# Patient Record
Sex: Female | Born: 1965 | Race: Black or African American | Hispanic: No | Marital: Married | Smoking: Never smoker
Health system: Southern US, Community
[De-identification: ages and names within clinical notes are randomized; demographics above are authoritative.]

## PROBLEM LIST (undated history)

## (undated) DIAGNOSIS — C801 Malignant (primary) neoplasm, unspecified: Secondary | ICD-10-CM

## (undated) DIAGNOSIS — I10 Essential (primary) hypertension: Secondary | ICD-10-CM

---

## 1998-05-20 ENCOUNTER — Other Ambulatory Visit: Admission: RE | Admit: 1998-05-20 | Discharge: 1998-05-20 | Payer: Self-pay | Admitting: Obstetrics and Gynecology

## 1998-07-09 ENCOUNTER — Other Ambulatory Visit: Admission: RE | Admit: 1998-07-09 | Discharge: 1998-07-09 | Payer: Self-pay | Admitting: Obstetrics & Gynecology

## 1998-11-27 ENCOUNTER — Other Ambulatory Visit: Admission: RE | Admit: 1998-11-27 | Discharge: 1998-11-27 | Payer: Self-pay | Admitting: *Deleted

## 1999-05-13 ENCOUNTER — Inpatient Hospital Stay (HOSPITAL_COMMUNITY): Admission: AD | Admit: 1999-05-13 | Discharge: 1999-05-15 | Payer: Self-pay | Admitting: Obstetrics and Gynecology

## 2001-02-23 ENCOUNTER — Other Ambulatory Visit: Admission: RE | Admit: 2001-02-23 | Discharge: 2001-02-23 | Payer: Self-pay | Admitting: Obstetrics and Gynecology

## 2002-12-02 ENCOUNTER — Other Ambulatory Visit: Admission: RE | Admit: 2002-12-02 | Discharge: 2002-12-02 | Payer: Self-pay | Admitting: Obstetrics and Gynecology

## 2003-12-24 ENCOUNTER — Other Ambulatory Visit: Admission: RE | Admit: 2003-12-24 | Discharge: 2003-12-24 | Payer: Self-pay | Admitting: Obstetrics and Gynecology

## 2005-03-03 ENCOUNTER — Other Ambulatory Visit: Admission: RE | Admit: 2005-03-03 | Discharge: 2005-03-03 | Payer: Self-pay | Admitting: Obstetrics and Gynecology

## 2006-01-03 ENCOUNTER — Other Ambulatory Visit: Admission: RE | Admit: 2006-01-03 | Discharge: 2006-01-03 | Payer: Self-pay | Admitting: Obstetrics and Gynecology

## 2007-02-04 ENCOUNTER — Encounter (INDEPENDENT_AMBULATORY_CARE_PROVIDER_SITE_OTHER): Payer: Self-pay | Admitting: Obstetrics and Gynecology

## 2007-02-04 ENCOUNTER — Inpatient Hospital Stay (HOSPITAL_COMMUNITY): Admission: AD | Admit: 2007-02-04 | Discharge: 2007-02-04 | Payer: Self-pay | Admitting: Obstetrics and Gynecology

## 2012-04-30 ENCOUNTER — Other Ambulatory Visit (HOSPITAL_COMMUNITY)
Admission: RE | Admit: 2012-04-30 | Discharge: 2012-04-30 | Disposition: A | Discharge status: Home / Self Care | Payer: BC Managed Care – PPO | Source: Ambulatory Visit | Attending: Family Medicine | Admitting: Family Medicine

## 2012-04-30 ENCOUNTER — Other Ambulatory Visit: Payer: Self-pay | Admitting: Family Medicine

## 2012-04-30 DIAGNOSIS — Z01419 Encounter for gynecological examination (general) (routine) without abnormal findings: Secondary | ICD-10-CM | POA: Insufficient documentation

## 2012-05-16 ENCOUNTER — Other Ambulatory Visit: Payer: Self-pay | Admitting: Family Medicine

## 2012-05-16 DIAGNOSIS — Z1231 Encounter for screening mammogram for malignant neoplasm of breast: Secondary | ICD-10-CM

## 2012-06-07 ENCOUNTER — Ambulatory Visit
Admission: RE | Admit: 2012-06-07 | Discharge: 2012-06-07 | Disposition: A | Discharge status: Home / Self Care | Payer: BC Managed Care – PPO | Source: Ambulatory Visit | Attending: Family Medicine | Admitting: Family Medicine

## 2012-06-07 DIAGNOSIS — Z1231 Encounter for screening mammogram for malignant neoplasm of breast: Secondary | ICD-10-CM

## 2013-10-08 ENCOUNTER — Other Ambulatory Visit: Payer: Self-pay | Admitting: Family Medicine

## 2013-10-08 DIAGNOSIS — N938 Other specified abnormal uterine and vaginal bleeding: Secondary | ICD-10-CM

## 2013-10-10 ENCOUNTER — Ambulatory Visit
Admission: RE | Admit: 2013-10-10 | Discharge: 2013-10-10 | Disposition: A | Discharge status: Home / Self Care | Payer: BC Managed Care – PPO | Source: Ambulatory Visit | Attending: Family Medicine | Admitting: Family Medicine

## 2013-10-10 DIAGNOSIS — N938 Other specified abnormal uterine and vaginal bleeding: Secondary | ICD-10-CM

## 2014-01-16 ENCOUNTER — Other Ambulatory Visit: Payer: Self-pay | Admitting: Obstetrics and Gynecology

## 2016-10-23 DIAGNOSIS — J101 Influenza due to other identified influenza virus with other respiratory manifestations: Secondary | ICD-10-CM | POA: Diagnosis not present

## 2016-12-12 DIAGNOSIS — M654 Radial styloid tenosynovitis [de Quervain]: Secondary | ICD-10-CM | POA: Diagnosis not present

## 2016-12-12 DIAGNOSIS — M79641 Pain in right hand: Secondary | ICD-10-CM | POA: Diagnosis not present

## 2016-12-26 DIAGNOSIS — M654 Radial styloid tenosynovitis [de Quervain]: Secondary | ICD-10-CM | POA: Diagnosis not present

## 2017-01-11 DIAGNOSIS — M9907 Segmental and somatic dysfunction of upper extremity: Secondary | ICD-10-CM | POA: Diagnosis not present

## 2017-01-11 DIAGNOSIS — M9901 Segmental and somatic dysfunction of cervical region: Secondary | ICD-10-CM | POA: Diagnosis not present

## 2017-01-12 DIAGNOSIS — M9901 Segmental and somatic dysfunction of cervical region: Secondary | ICD-10-CM | POA: Diagnosis not present

## 2017-01-12 DIAGNOSIS — M9907 Segmental and somatic dysfunction of upper extremity: Secondary | ICD-10-CM | POA: Diagnosis not present

## 2017-01-16 DIAGNOSIS — M9907 Segmental and somatic dysfunction of upper extremity: Secondary | ICD-10-CM | POA: Diagnosis not present

## 2017-01-16 DIAGNOSIS — M9901 Segmental and somatic dysfunction of cervical region: Secondary | ICD-10-CM | POA: Diagnosis not present

## 2017-01-17 DIAGNOSIS — M9907 Segmental and somatic dysfunction of upper extremity: Secondary | ICD-10-CM | POA: Diagnosis not present

## 2017-01-17 DIAGNOSIS — M9901 Segmental and somatic dysfunction of cervical region: Secondary | ICD-10-CM | POA: Diagnosis not present

## 2017-01-18 DIAGNOSIS — M9907 Segmental and somatic dysfunction of upper extremity: Secondary | ICD-10-CM | POA: Diagnosis not present

## 2017-01-18 DIAGNOSIS — M9901 Segmental and somatic dysfunction of cervical region: Secondary | ICD-10-CM | POA: Diagnosis not present

## 2017-01-24 DIAGNOSIS — M9907 Segmental and somatic dysfunction of upper extremity: Secondary | ICD-10-CM | POA: Diagnosis not present

## 2017-01-24 DIAGNOSIS — M9901 Segmental and somatic dysfunction of cervical region: Secondary | ICD-10-CM | POA: Diagnosis not present

## 2017-01-25 DIAGNOSIS — M9901 Segmental and somatic dysfunction of cervical region: Secondary | ICD-10-CM | POA: Diagnosis not present

## 2017-01-25 DIAGNOSIS — M9907 Segmental and somatic dysfunction of upper extremity: Secondary | ICD-10-CM | POA: Diagnosis not present

## 2017-01-30 DIAGNOSIS — M9907 Segmental and somatic dysfunction of upper extremity: Secondary | ICD-10-CM | POA: Diagnosis not present

## 2017-01-30 DIAGNOSIS — M9901 Segmental and somatic dysfunction of cervical region: Secondary | ICD-10-CM | POA: Diagnosis not present

## 2017-01-31 DIAGNOSIS — M9901 Segmental and somatic dysfunction of cervical region: Secondary | ICD-10-CM | POA: Diagnosis not present

## 2017-01-31 DIAGNOSIS — M9907 Segmental and somatic dysfunction of upper extremity: Secondary | ICD-10-CM | POA: Diagnosis not present

## 2017-02-06 DIAGNOSIS — M9901 Segmental and somatic dysfunction of cervical region: Secondary | ICD-10-CM | POA: Diagnosis not present

## 2017-02-06 DIAGNOSIS — M9907 Segmental and somatic dysfunction of upper extremity: Secondary | ICD-10-CM | POA: Diagnosis not present

## 2017-02-07 DIAGNOSIS — M9907 Segmental and somatic dysfunction of upper extremity: Secondary | ICD-10-CM | POA: Diagnosis not present

## 2017-02-07 DIAGNOSIS — M9901 Segmental and somatic dysfunction of cervical region: Secondary | ICD-10-CM | POA: Diagnosis not present

## 2017-02-14 DIAGNOSIS — M9901 Segmental and somatic dysfunction of cervical region: Secondary | ICD-10-CM | POA: Diagnosis not present

## 2017-02-14 DIAGNOSIS — M9907 Segmental and somatic dysfunction of upper extremity: Secondary | ICD-10-CM | POA: Diagnosis not present

## 2017-02-20 DIAGNOSIS — M9901 Segmental and somatic dysfunction of cervical region: Secondary | ICD-10-CM | POA: Diagnosis not present

## 2017-02-20 DIAGNOSIS — M9907 Segmental and somatic dysfunction of upper extremity: Secondary | ICD-10-CM | POA: Diagnosis not present

## 2017-02-21 DIAGNOSIS — B9789 Other viral agents as the cause of diseases classified elsewhere: Secondary | ICD-10-CM | POA: Diagnosis not present

## 2017-02-21 DIAGNOSIS — M654 Radial styloid tenosynovitis [de Quervain]: Secondary | ICD-10-CM | POA: Diagnosis not present

## 2017-02-21 DIAGNOSIS — J069 Acute upper respiratory infection, unspecified: Secondary | ICD-10-CM | POA: Diagnosis not present

## 2017-08-16 DIAGNOSIS — I10 Essential (primary) hypertension: Secondary | ICD-10-CM | POA: Diagnosis not present

## 2017-08-16 DIAGNOSIS — Z6832 Body mass index (BMI) 32.0-32.9, adult: Secondary | ICD-10-CM | POA: Diagnosis not present

## 2017-10-09 DIAGNOSIS — I10 Essential (primary) hypertension: Secondary | ICD-10-CM | POA: Diagnosis not present

## 2017-10-09 DIAGNOSIS — Z6830 Body mass index (BMI) 30.0-30.9, adult: Secondary | ICD-10-CM | POA: Diagnosis not present

## 2018-03-12 DIAGNOSIS — N644 Mastodynia: Secondary | ICD-10-CM | POA: Diagnosis not present

## 2018-03-12 DIAGNOSIS — R590 Localized enlarged lymph nodes: Secondary | ICD-10-CM | POA: Diagnosis not present

## 2018-03-13 ENCOUNTER — Other Ambulatory Visit: Payer: Self-pay | Admitting: Family Medicine

## 2018-03-13 DIAGNOSIS — N644 Mastodynia: Secondary | ICD-10-CM

## 2018-03-28 ENCOUNTER — Other Ambulatory Visit: Payer: Self-pay

## 2018-03-28 DIAGNOSIS — R922 Inconclusive mammogram: Secondary | ICD-10-CM | POA: Diagnosis not present

## 2018-03-28 DIAGNOSIS — N644 Mastodynia: Secondary | ICD-10-CM | POA: Diagnosis not present

## 2018-03-28 DIAGNOSIS — N6311 Unspecified lump in the right breast, upper outer quadrant: Secondary | ICD-10-CM | POA: Diagnosis not present

## 2018-04-06 DIAGNOSIS — N6489 Other specified disorders of breast: Secondary | ICD-10-CM | POA: Diagnosis not present

## 2018-04-06 DIAGNOSIS — R229 Localized swelling, mass and lump, unspecified: Secondary | ICD-10-CM | POA: Diagnosis not present

## 2018-04-06 DIAGNOSIS — C50811 Malignant neoplasm of overlapping sites of right female breast: Secondary | ICD-10-CM | POA: Diagnosis not present

## 2018-04-06 DIAGNOSIS — R590 Localized enlarged lymph nodes: Secondary | ICD-10-CM | POA: Diagnosis not present

## 2018-04-06 DIAGNOSIS — N6311 Unspecified lump in the right breast, upper outer quadrant: Secondary | ICD-10-CM | POA: Diagnosis not present

## 2018-04-06 DIAGNOSIS — C773 Secondary and unspecified malignant neoplasm of axilla and upper limb lymph nodes: Secondary | ICD-10-CM | POA: Diagnosis not present

## 2018-04-11 DIAGNOSIS — I10 Essential (primary) hypertension: Secondary | ICD-10-CM | POA: Diagnosis not present

## 2018-04-11 DIAGNOSIS — Z171 Estrogen receptor negative status [ER-]: Secondary | ICD-10-CM | POA: Diagnosis not present

## 2018-04-11 DIAGNOSIS — Z9889 Other specified postprocedural states: Secondary | ICD-10-CM | POA: Diagnosis not present

## 2018-04-11 DIAGNOSIS — C773 Secondary and unspecified malignant neoplasm of axilla and upper limb lymph nodes: Secondary | ICD-10-CM | POA: Diagnosis not present

## 2018-04-11 DIAGNOSIS — C50811 Malignant neoplasm of overlapping sites of right female breast: Secondary | ICD-10-CM | POA: Diagnosis not present

## 2018-04-11 DIAGNOSIS — C50911 Malignant neoplasm of unspecified site of right female breast: Secondary | ICD-10-CM | POA: Diagnosis not present

## 2018-04-12 DIAGNOSIS — R590 Localized enlarged lymph nodes: Secondary | ICD-10-CM | POA: Diagnosis not present

## 2018-04-12 DIAGNOSIS — D259 Leiomyoma of uterus, unspecified: Secondary | ICD-10-CM | POA: Diagnosis not present

## 2018-04-12 DIAGNOSIS — C50811 Malignant neoplasm of overlapping sites of right female breast: Secondary | ICD-10-CM | POA: Diagnosis not present

## 2018-04-12 DIAGNOSIS — Z452 Encounter for adjustment and management of vascular access device: Secondary | ICD-10-CM | POA: Diagnosis not present

## 2018-04-12 DIAGNOSIS — R932 Abnormal findings on diagnostic imaging of liver and biliary tract: Secondary | ICD-10-CM | POA: Diagnosis not present

## 2018-04-12 DIAGNOSIS — Z171 Estrogen receptor negative status [ER-]: Secondary | ICD-10-CM | POA: Diagnosis not present

## 2018-04-12 DIAGNOSIS — I10 Essential (primary) hypertension: Secondary | ICD-10-CM | POA: Diagnosis not present

## 2018-04-12 DIAGNOSIS — C50919 Malignant neoplasm of unspecified site of unspecified female breast: Secondary | ICD-10-CM | POA: Diagnosis not present

## 2018-04-18 DIAGNOSIS — C773 Secondary and unspecified malignant neoplasm of axilla and upper limb lymph nodes: Secondary | ICD-10-CM | POA: Diagnosis not present

## 2018-04-18 DIAGNOSIS — C50911 Malignant neoplasm of unspecified site of right female breast: Secondary | ICD-10-CM | POA: Diagnosis not present

## 2018-04-18 DIAGNOSIS — Z803 Family history of malignant neoplasm of breast: Secondary | ICD-10-CM | POA: Diagnosis not present

## 2018-04-18 DIAGNOSIS — C50919 Malignant neoplasm of unspecified site of unspecified female breast: Secondary | ICD-10-CM | POA: Diagnosis not present

## 2018-04-19 DIAGNOSIS — C50811 Malignant neoplasm of overlapping sites of right female breast: Secondary | ICD-10-CM | POA: Diagnosis not present

## 2018-04-19 DIAGNOSIS — Z171 Estrogen receptor negative status [ER-]: Secondary | ICD-10-CM | POA: Diagnosis not present

## 2018-04-19 DIAGNOSIS — Z5111 Encounter for antineoplastic chemotherapy: Secondary | ICD-10-CM | POA: Diagnosis not present

## 2018-04-19 DIAGNOSIS — C773 Secondary and unspecified malignant neoplasm of axilla and upper limb lymph nodes: Secondary | ICD-10-CM | POA: Diagnosis not present

## 2018-04-19 DIAGNOSIS — C50911 Malignant neoplasm of unspecified site of right female breast: Secondary | ICD-10-CM | POA: Diagnosis not present

## 2018-04-23 DIAGNOSIS — Z8042 Family history of malignant neoplasm of prostate: Secondary | ICD-10-CM | POA: Diagnosis not present

## 2018-04-23 DIAGNOSIS — Z5111 Encounter for antineoplastic chemotherapy: Secondary | ICD-10-CM | POA: Diagnosis not present

## 2018-04-23 DIAGNOSIS — C773 Secondary and unspecified malignant neoplasm of axilla and upper limb lymph nodes: Secondary | ICD-10-CM | POA: Diagnosis not present

## 2018-04-23 DIAGNOSIS — C50911 Malignant neoplasm of unspecified site of right female breast: Secondary | ICD-10-CM | POA: Diagnosis not present

## 2018-04-23 DIAGNOSIS — G4700 Insomnia, unspecified: Secondary | ICD-10-CM | POA: Diagnosis not present

## 2018-04-30 DIAGNOSIS — C50911 Malignant neoplasm of unspecified site of right female breast: Secondary | ICD-10-CM | POA: Diagnosis not present

## 2018-04-30 DIAGNOSIS — C773 Secondary and unspecified malignant neoplasm of axilla and upper limb lymph nodes: Secondary | ICD-10-CM | POA: Diagnosis not present

## 2018-04-30 DIAGNOSIS — Z5111 Encounter for antineoplastic chemotherapy: Secondary | ICD-10-CM | POA: Diagnosis not present

## 2018-05-07 DIAGNOSIS — C773 Secondary and unspecified malignant neoplasm of axilla and upper limb lymph nodes: Secondary | ICD-10-CM | POA: Diagnosis not present

## 2018-05-07 DIAGNOSIS — C50911 Malignant neoplasm of unspecified site of right female breast: Secondary | ICD-10-CM | POA: Diagnosis not present

## 2018-05-07 DIAGNOSIS — Z5111 Encounter for antineoplastic chemotherapy: Secondary | ICD-10-CM | POA: Diagnosis not present

## 2018-05-08 DIAGNOSIS — C50811 Malignant neoplasm of overlapping sites of right female breast: Secondary | ICD-10-CM | POA: Diagnosis not present

## 2018-05-08 DIAGNOSIS — C773 Secondary and unspecified malignant neoplasm of axilla and upper limb lymph nodes: Secondary | ICD-10-CM | POA: Diagnosis not present

## 2018-05-08 DIAGNOSIS — Z5111 Encounter for antineoplastic chemotherapy: Secondary | ICD-10-CM | POA: Diagnosis not present

## 2018-05-08 DIAGNOSIS — C50911 Malignant neoplasm of unspecified site of right female breast: Secondary | ICD-10-CM | POA: Diagnosis not present

## 2018-05-08 DIAGNOSIS — G4700 Insomnia, unspecified: Secondary | ICD-10-CM | POA: Diagnosis not present

## 2018-05-08 DIAGNOSIS — Z171 Estrogen receptor negative status [ER-]: Secondary | ICD-10-CM | POA: Diagnosis not present

## 2018-05-15 DIAGNOSIS — C50911 Malignant neoplasm of unspecified site of right female breast: Secondary | ICD-10-CM | POA: Diagnosis not present

## 2018-05-15 DIAGNOSIS — C773 Secondary and unspecified malignant neoplasm of axilla and upper limb lymph nodes: Secondary | ICD-10-CM | POA: Diagnosis not present

## 2018-05-15 DIAGNOSIS — Z5111 Encounter for antineoplastic chemotherapy: Secondary | ICD-10-CM | POA: Diagnosis not present

## 2018-05-22 DIAGNOSIS — R112 Nausea with vomiting, unspecified: Secondary | ICD-10-CM | POA: Diagnosis not present

## 2018-05-22 DIAGNOSIS — D6481 Anemia due to antineoplastic chemotherapy: Secondary | ICD-10-CM | POA: Diagnosis not present

## 2018-05-22 DIAGNOSIS — K047 Periapical abscess without sinus: Secondary | ICD-10-CM | POA: Diagnosis not present

## 2018-05-22 DIAGNOSIS — G4700 Insomnia, unspecified: Secondary | ICD-10-CM | POA: Diagnosis not present

## 2018-05-22 DIAGNOSIS — T451X5A Adverse effect of antineoplastic and immunosuppressive drugs, initial encounter: Secondary | ICD-10-CM | POA: Diagnosis not present

## 2018-05-22 DIAGNOSIS — I10 Essential (primary) hypertension: Secondary | ICD-10-CM | POA: Diagnosis not present

## 2018-05-22 DIAGNOSIS — C773 Secondary and unspecified malignant neoplasm of axilla and upper limb lymph nodes: Secondary | ICD-10-CM | POA: Diagnosis not present

## 2018-05-22 DIAGNOSIS — K1230 Oral mucositis (ulcerative), unspecified: Secondary | ICD-10-CM | POA: Diagnosis not present

## 2018-05-22 DIAGNOSIS — C50911 Malignant neoplasm of unspecified site of right female breast: Secondary | ICD-10-CM | POA: Diagnosis not present

## 2018-05-22 DIAGNOSIS — Z5111 Encounter for antineoplastic chemotherapy: Secondary | ICD-10-CM | POA: Diagnosis not present

## 2018-05-28 DIAGNOSIS — Z5111 Encounter for antineoplastic chemotherapy: Secondary | ICD-10-CM | POA: Diagnosis not present

## 2018-05-28 DIAGNOSIS — R112 Nausea with vomiting, unspecified: Secondary | ICD-10-CM | POA: Diagnosis not present

## 2018-05-28 DIAGNOSIS — G4700 Insomnia, unspecified: Secondary | ICD-10-CM | POA: Diagnosis not present

## 2018-05-28 DIAGNOSIS — K047 Periapical abscess without sinus: Secondary | ICD-10-CM | POA: Diagnosis not present

## 2018-05-28 DIAGNOSIS — I10 Essential (primary) hypertension: Secondary | ICD-10-CM | POA: Diagnosis not present

## 2018-05-28 DIAGNOSIS — C773 Secondary and unspecified malignant neoplasm of axilla and upper limb lymph nodes: Secondary | ICD-10-CM | POA: Diagnosis not present

## 2018-05-28 DIAGNOSIS — T451X5A Adverse effect of antineoplastic and immunosuppressive drugs, initial encounter: Secondary | ICD-10-CM | POA: Diagnosis not present

## 2018-05-28 DIAGNOSIS — C50911 Malignant neoplasm of unspecified site of right female breast: Secondary | ICD-10-CM | POA: Diagnosis not present

## 2018-05-28 DIAGNOSIS — D6481 Anemia due to antineoplastic chemotherapy: Secondary | ICD-10-CM | POA: Diagnosis not present

## 2018-06-05 DIAGNOSIS — D6481 Anemia due to antineoplastic chemotherapy: Secondary | ICD-10-CM | POA: Diagnosis not present

## 2018-06-05 DIAGNOSIS — Z5111 Encounter for antineoplastic chemotherapy: Secondary | ICD-10-CM | POA: Diagnosis not present

## 2018-06-05 DIAGNOSIS — C773 Secondary and unspecified malignant neoplasm of axilla and upper limb lymph nodes: Secondary | ICD-10-CM | POA: Diagnosis not present

## 2018-06-05 DIAGNOSIS — I10 Essential (primary) hypertension: Secondary | ICD-10-CM | POA: Diagnosis not present

## 2018-06-05 DIAGNOSIS — T451X5A Adverse effect of antineoplastic and immunosuppressive drugs, initial encounter: Secondary | ICD-10-CM | POA: Diagnosis not present

## 2018-06-05 DIAGNOSIS — K047 Periapical abscess without sinus: Secondary | ICD-10-CM | POA: Diagnosis not present

## 2018-06-05 DIAGNOSIS — C50911 Malignant neoplasm of unspecified site of right female breast: Secondary | ICD-10-CM | POA: Diagnosis not present

## 2018-06-05 DIAGNOSIS — R112 Nausea with vomiting, unspecified: Secondary | ICD-10-CM | POA: Diagnosis not present

## 2018-06-05 DIAGNOSIS — G4700 Insomnia, unspecified: Secondary | ICD-10-CM | POA: Diagnosis not present

## 2018-06-05 DIAGNOSIS — K1230 Oral mucositis (ulcerative), unspecified: Secondary | ICD-10-CM | POA: Diagnosis not present

## 2018-06-11 DIAGNOSIS — I10 Essential (primary) hypertension: Secondary | ICD-10-CM | POA: Diagnosis not present

## 2018-06-11 DIAGNOSIS — C50911 Malignant neoplasm of unspecified site of right female breast: Secondary | ICD-10-CM | POA: Diagnosis not present

## 2018-06-11 DIAGNOSIS — C773 Secondary and unspecified malignant neoplasm of axilla and upper limb lymph nodes: Secondary | ICD-10-CM | POA: Diagnosis not present

## 2018-06-19 DIAGNOSIS — T451X5A Adverse effect of antineoplastic and immunosuppressive drugs, initial encounter: Secondary | ICD-10-CM | POA: Diagnosis not present

## 2018-06-19 DIAGNOSIS — R112 Nausea with vomiting, unspecified: Secondary | ICD-10-CM | POA: Diagnosis not present

## 2018-06-19 DIAGNOSIS — C50911 Malignant neoplasm of unspecified site of right female breast: Secondary | ICD-10-CM | POA: Diagnosis not present

## 2018-06-19 DIAGNOSIS — G4700 Insomnia, unspecified: Secondary | ICD-10-CM | POA: Diagnosis not present

## 2018-06-19 DIAGNOSIS — D6481 Anemia due to antineoplastic chemotherapy: Secondary | ICD-10-CM | POA: Diagnosis not present

## 2018-06-19 DIAGNOSIS — C773 Secondary and unspecified malignant neoplasm of axilla and upper limb lymph nodes: Secondary | ICD-10-CM | POA: Diagnosis not present

## 2018-06-19 DIAGNOSIS — K047 Periapical abscess without sinus: Secondary | ICD-10-CM | POA: Diagnosis not present

## 2018-06-19 DIAGNOSIS — I10 Essential (primary) hypertension: Secondary | ICD-10-CM | POA: Diagnosis not present

## 2018-06-19 DIAGNOSIS — K1230 Oral mucositis (ulcerative), unspecified: Secondary | ICD-10-CM | POA: Diagnosis not present

## 2018-06-19 DIAGNOSIS — Z5111 Encounter for antineoplastic chemotherapy: Secondary | ICD-10-CM | POA: Diagnosis not present

## 2018-06-26 DIAGNOSIS — C50911 Malignant neoplasm of unspecified site of right female breast: Secondary | ICD-10-CM | POA: Diagnosis not present

## 2018-06-26 DIAGNOSIS — C773 Secondary and unspecified malignant neoplasm of axilla and upper limb lymph nodes: Secondary | ICD-10-CM | POA: Diagnosis not present

## 2018-06-26 DIAGNOSIS — Z5111 Encounter for antineoplastic chemotherapy: Secondary | ICD-10-CM | POA: Diagnosis not present

## 2018-07-03 DIAGNOSIS — D6481 Anemia due to antineoplastic chemotherapy: Secondary | ICD-10-CM | POA: Diagnosis not present

## 2018-07-03 DIAGNOSIS — Z5111 Encounter for antineoplastic chemotherapy: Secondary | ICD-10-CM | POA: Diagnosis not present

## 2018-07-03 DIAGNOSIS — K1230 Oral mucositis (ulcerative), unspecified: Secondary | ICD-10-CM | POA: Diagnosis not present

## 2018-07-03 DIAGNOSIS — L603 Nail dystrophy: Secondary | ICD-10-CM | POA: Diagnosis not present

## 2018-07-03 DIAGNOSIS — R112 Nausea with vomiting, unspecified: Secondary | ICD-10-CM | POA: Diagnosis not present

## 2018-07-03 DIAGNOSIS — L089 Local infection of the skin and subcutaneous tissue, unspecified: Secondary | ICD-10-CM | POA: Diagnosis not present

## 2018-07-03 DIAGNOSIS — C773 Secondary and unspecified malignant neoplasm of axilla and upper limb lymph nodes: Secondary | ICD-10-CM | POA: Diagnosis not present

## 2018-07-03 DIAGNOSIS — C50911 Malignant neoplasm of unspecified site of right female breast: Secondary | ICD-10-CM | POA: Diagnosis not present

## 2018-07-10 DIAGNOSIS — Z5111 Encounter for antineoplastic chemotherapy: Secondary | ICD-10-CM | POA: Diagnosis not present

## 2018-07-10 DIAGNOSIS — C773 Secondary and unspecified malignant neoplasm of axilla and upper limb lymph nodes: Secondary | ICD-10-CM | POA: Diagnosis not present

## 2018-07-10 DIAGNOSIS — C50911 Malignant neoplasm of unspecified site of right female breast: Secondary | ICD-10-CM | POA: Diagnosis not present

## 2018-07-17 DIAGNOSIS — L089 Local infection of the skin and subcutaneous tissue, unspecified: Secondary | ICD-10-CM | POA: Diagnosis not present

## 2018-07-17 DIAGNOSIS — C773 Secondary and unspecified malignant neoplasm of axilla and upper limb lymph nodes: Secondary | ICD-10-CM | POA: Diagnosis not present

## 2018-07-17 DIAGNOSIS — Z5111 Encounter for antineoplastic chemotherapy: Secondary | ICD-10-CM | POA: Diagnosis not present

## 2018-07-17 DIAGNOSIS — R112 Nausea with vomiting, unspecified: Secondary | ICD-10-CM | POA: Diagnosis not present

## 2018-07-17 DIAGNOSIS — C50911 Malignant neoplasm of unspecified site of right female breast: Secondary | ICD-10-CM | POA: Diagnosis not present

## 2018-07-17 DIAGNOSIS — L603 Nail dystrophy: Secondary | ICD-10-CM | POA: Diagnosis not present

## 2018-07-24 DIAGNOSIS — C50911 Malignant neoplasm of unspecified site of right female breast: Secondary | ICD-10-CM | POA: Diagnosis not present

## 2018-07-24 DIAGNOSIS — Z5111 Encounter for antineoplastic chemotherapy: Secondary | ICD-10-CM | POA: Diagnosis not present

## 2018-07-24 DIAGNOSIS — C773 Secondary and unspecified malignant neoplasm of axilla and upper limb lymph nodes: Secondary | ICD-10-CM | POA: Diagnosis not present

## 2018-07-31 DIAGNOSIS — G620 Drug-induced polyneuropathy: Secondary | ICD-10-CM | POA: Diagnosis not present

## 2018-07-31 DIAGNOSIS — R112 Nausea with vomiting, unspecified: Secondary | ICD-10-CM | POA: Diagnosis not present

## 2018-07-31 DIAGNOSIS — Z5111 Encounter for antineoplastic chemotherapy: Secondary | ICD-10-CM | POA: Diagnosis not present

## 2018-07-31 DIAGNOSIS — C50911 Malignant neoplasm of unspecified site of right female breast: Secondary | ICD-10-CM | POA: Diagnosis not present

## 2018-07-31 DIAGNOSIS — C773 Secondary and unspecified malignant neoplasm of axilla and upper limb lymph nodes: Secondary | ICD-10-CM | POA: Diagnosis not present

## 2018-07-31 DIAGNOSIS — L603 Nail dystrophy: Secondary | ICD-10-CM | POA: Diagnosis not present

## 2018-07-31 DIAGNOSIS — K047 Periapical abscess without sinus: Secondary | ICD-10-CM | POA: Diagnosis not present

## 2018-08-07 DIAGNOSIS — C50911 Malignant neoplasm of unspecified site of right female breast: Secondary | ICD-10-CM | POA: Diagnosis not present

## 2018-08-07 DIAGNOSIS — Z5111 Encounter for antineoplastic chemotherapy: Secondary | ICD-10-CM | POA: Diagnosis not present

## 2018-08-07 DIAGNOSIS — C773 Secondary and unspecified malignant neoplasm of axilla and upper limb lymph nodes: Secondary | ICD-10-CM | POA: Diagnosis not present

## 2018-08-14 DIAGNOSIS — C50911 Malignant neoplasm of unspecified site of right female breast: Secondary | ICD-10-CM | POA: Diagnosis not present

## 2018-08-14 DIAGNOSIS — C773 Secondary and unspecified malignant neoplasm of axilla and upper limb lymph nodes: Secondary | ICD-10-CM | POA: Diagnosis not present

## 2018-08-14 DIAGNOSIS — G620 Drug-induced polyneuropathy: Secondary | ICD-10-CM | POA: Diagnosis not present

## 2018-08-14 DIAGNOSIS — Z5111 Encounter for antineoplastic chemotherapy: Secondary | ICD-10-CM | POA: Diagnosis not present

## 2018-08-14 DIAGNOSIS — K047 Periapical abscess without sinus: Secondary | ICD-10-CM | POA: Diagnosis not present

## 2018-08-14 DIAGNOSIS — L603 Nail dystrophy: Secondary | ICD-10-CM | POA: Diagnosis not present

## 2018-08-21 DIAGNOSIS — C773 Secondary and unspecified malignant neoplasm of axilla and upper limb lymph nodes: Secondary | ICD-10-CM | POA: Diagnosis not present

## 2018-08-21 DIAGNOSIS — T451X5A Adverse effect of antineoplastic and immunosuppressive drugs, initial encounter: Secondary | ICD-10-CM | POA: Diagnosis not present

## 2018-08-21 DIAGNOSIS — D6481 Anemia due to antineoplastic chemotherapy: Secondary | ICD-10-CM | POA: Diagnosis not present

## 2018-08-21 DIAGNOSIS — Z5111 Encounter for antineoplastic chemotherapy: Secondary | ICD-10-CM | POA: Diagnosis not present

## 2018-08-21 DIAGNOSIS — G620 Drug-induced polyneuropathy: Secondary | ICD-10-CM | POA: Diagnosis not present

## 2018-08-21 DIAGNOSIS — C50911 Malignant neoplasm of unspecified site of right female breast: Secondary | ICD-10-CM | POA: Diagnosis not present

## 2018-08-21 DIAGNOSIS — D696 Thrombocytopenia, unspecified: Secondary | ICD-10-CM | POA: Diagnosis not present

## 2018-08-28 DIAGNOSIS — D696 Thrombocytopenia, unspecified: Secondary | ICD-10-CM | POA: Diagnosis not present

## 2018-08-28 DIAGNOSIS — G620 Drug-induced polyneuropathy: Secondary | ICD-10-CM | POA: Diagnosis not present

## 2018-08-28 DIAGNOSIS — C773 Secondary and unspecified malignant neoplasm of axilla and upper limb lymph nodes: Secondary | ICD-10-CM | POA: Diagnosis not present

## 2018-08-28 DIAGNOSIS — Z5111 Encounter for antineoplastic chemotherapy: Secondary | ICD-10-CM | POA: Diagnosis not present

## 2018-08-28 DIAGNOSIS — D6481 Anemia due to antineoplastic chemotherapy: Secondary | ICD-10-CM | POA: Diagnosis not present

## 2018-08-28 DIAGNOSIS — C50911 Malignant neoplasm of unspecified site of right female breast: Secondary | ICD-10-CM | POA: Diagnosis not present

## 2018-08-28 DIAGNOSIS — T451X5A Adverse effect of antineoplastic and immunosuppressive drugs, initial encounter: Secondary | ICD-10-CM | POA: Diagnosis not present

## 2018-09-04 DIAGNOSIS — C773 Secondary and unspecified malignant neoplasm of axilla and upper limb lymph nodes: Secondary | ICD-10-CM | POA: Diagnosis not present

## 2018-09-04 DIAGNOSIS — T451X5A Adverse effect of antineoplastic and immunosuppressive drugs, initial encounter: Secondary | ICD-10-CM | POA: Diagnosis not present

## 2018-09-04 DIAGNOSIS — K047 Periapical abscess without sinus: Secondary | ICD-10-CM | POA: Diagnosis not present

## 2018-09-04 DIAGNOSIS — D6481 Anemia due to antineoplastic chemotherapy: Secondary | ICD-10-CM | POA: Diagnosis not present

## 2018-09-04 DIAGNOSIS — C50911 Malignant neoplasm of unspecified site of right female breast: Secondary | ICD-10-CM | POA: Diagnosis not present

## 2018-09-04 DIAGNOSIS — G620 Drug-induced polyneuropathy: Secondary | ICD-10-CM | POA: Diagnosis not present

## 2018-09-04 DIAGNOSIS — D696 Thrombocytopenia, unspecified: Secondary | ICD-10-CM | POA: Diagnosis not present

## 2018-09-04 DIAGNOSIS — Z5111 Encounter for antineoplastic chemotherapy: Secondary | ICD-10-CM | POA: Diagnosis not present

## 2018-09-20 DIAGNOSIS — C50911 Malignant neoplasm of unspecified site of right female breast: Secondary | ICD-10-CM | POA: Diagnosis not present

## 2018-09-20 DIAGNOSIS — C773 Secondary and unspecified malignant neoplasm of axilla and upper limb lymph nodes: Secondary | ICD-10-CM | POA: Diagnosis not present

## 2018-10-10 ENCOUNTER — Other Ambulatory Visit (HOSPITAL_COMMUNITY)
Admission: RE | Admit: 2018-10-10 | Discharge: 2018-10-10 | Disposition: A | Discharge status: Home / Self Care | Payer: BLUE CROSS/BLUE SHIELD | Source: Ambulatory Visit | Attending: Family Medicine | Admitting: Family Medicine

## 2018-10-10 ENCOUNTER — Other Ambulatory Visit: Payer: Self-pay | Admitting: Family Medicine

## 2018-10-10 DIAGNOSIS — E782 Mixed hyperlipidemia: Secondary | ICD-10-CM | POA: Diagnosis not present

## 2018-10-10 DIAGNOSIS — Z124 Encounter for screening for malignant neoplasm of cervix: Secondary | ICD-10-CM | POA: Diagnosis not present

## 2018-10-10 DIAGNOSIS — Z0000 Encounter for general adult medical examination without abnormal findings: Secondary | ICD-10-CM | POA: Diagnosis not present

## 2018-10-10 DIAGNOSIS — I10 Essential (primary) hypertension: Secondary | ICD-10-CM | POA: Diagnosis not present

## 2018-10-15 DIAGNOSIS — C50911 Malignant neoplasm of unspecified site of right female breast: Secondary | ICD-10-CM | POA: Diagnosis not present

## 2018-10-15 DIAGNOSIS — L603 Nail dystrophy: Secondary | ICD-10-CM | POA: Diagnosis not present

## 2018-10-15 DIAGNOSIS — C773 Secondary and unspecified malignant neoplasm of axilla and upper limb lymph nodes: Secondary | ICD-10-CM | POA: Diagnosis not present

## 2018-10-15 DIAGNOSIS — K047 Periapical abscess without sinus: Secondary | ICD-10-CM | POA: Diagnosis not present

## 2018-10-15 DIAGNOSIS — D6481 Anemia due to antineoplastic chemotherapy: Secondary | ICD-10-CM | POA: Diagnosis not present

## 2018-10-15 DIAGNOSIS — T451X5A Adverse effect of antineoplastic and immunosuppressive drugs, initial encounter: Secondary | ICD-10-CM | POA: Diagnosis not present

## 2018-10-15 DIAGNOSIS — G620 Drug-induced polyneuropathy: Secondary | ICD-10-CM | POA: Diagnosis not present

## 2018-10-15 LAB — CYTOLOGY - PAP
Adequacy: ABSENT
Diagnosis: NEGATIVE

## 2018-10-16 DIAGNOSIS — C50919 Malignant neoplasm of unspecified site of unspecified female breast: Secondary | ICD-10-CM | POA: Diagnosis not present

## 2018-10-16 DIAGNOSIS — J069 Acute upper respiratory infection, unspecified: Secondary | ICD-10-CM | POA: Diagnosis not present

## 2018-10-16 DIAGNOSIS — R0981 Nasal congestion: Secondary | ICD-10-CM | POA: Diagnosis not present

## 2018-10-17 DIAGNOSIS — I10 Essential (primary) hypertension: Secondary | ICD-10-CM | POA: Diagnosis not present

## 2018-10-17 DIAGNOSIS — Z800 Family history of malignant neoplasm of digestive organs: Secondary | ICD-10-CM | POA: Diagnosis not present

## 2018-10-17 DIAGNOSIS — N6310 Unspecified lump in the right breast, unspecified quadrant: Secondary | ICD-10-CM | POA: Diagnosis not present

## 2018-10-17 DIAGNOSIS — C773 Secondary and unspecified malignant neoplasm of axilla and upper limb lymph nodes: Secondary | ICD-10-CM | POA: Diagnosis not present

## 2018-10-17 DIAGNOSIS — R2231 Localized swelling, mass and lump, right upper limb: Secondary | ICD-10-CM | POA: Diagnosis not present

## 2018-10-17 DIAGNOSIS — E669 Obesity, unspecified: Secondary | ICD-10-CM | POA: Diagnosis not present

## 2018-10-17 DIAGNOSIS — C50911 Malignant neoplasm of unspecified site of right female breast: Secondary | ICD-10-CM | POA: Diagnosis not present

## 2018-10-17 DIAGNOSIS — N6031 Fibrosclerosis of right breast: Secondary | ICD-10-CM | POA: Diagnosis not present

## 2018-10-17 DIAGNOSIS — M545 Low back pain: Secondary | ICD-10-CM | POA: Diagnosis not present

## 2018-10-17 DIAGNOSIS — Z836 Family history of other diseases of the respiratory system: Secondary | ICD-10-CM | POA: Diagnosis not present

## 2018-10-17 DIAGNOSIS — Z803 Family history of malignant neoplasm of breast: Secondary | ICD-10-CM | POA: Diagnosis not present

## 2018-10-17 DIAGNOSIS — Z79899 Other long term (current) drug therapy: Secondary | ICD-10-CM | POA: Diagnosis not present

## 2018-10-17 DIAGNOSIS — Z853 Personal history of malignant neoplasm of breast: Secondary | ICD-10-CM | POA: Diagnosis not present

## 2018-10-17 DIAGNOSIS — Z9221 Personal history of antineoplastic chemotherapy: Secondary | ICD-10-CM | POA: Diagnosis not present

## 2018-10-31 DIAGNOSIS — L603 Nail dystrophy: Secondary | ICD-10-CM | POA: Diagnosis not present

## 2018-10-31 DIAGNOSIS — G620 Drug-induced polyneuropathy: Secondary | ICD-10-CM | POA: Diagnosis not present

## 2018-10-31 DIAGNOSIS — Z171 Estrogen receptor negative status [ER-]: Secondary | ICD-10-CM | POA: Diagnosis not present

## 2018-10-31 DIAGNOSIS — C773 Secondary and unspecified malignant neoplasm of axilla and upper limb lymph nodes: Secondary | ICD-10-CM | POA: Diagnosis not present

## 2018-10-31 DIAGNOSIS — C50911 Malignant neoplasm of unspecified site of right female breast: Secondary | ICD-10-CM | POA: Diagnosis not present

## 2018-10-31 DIAGNOSIS — K047 Periapical abscess without sinus: Secondary | ICD-10-CM | POA: Diagnosis not present

## 2018-10-31 DIAGNOSIS — Z95828 Presence of other vascular implants and grafts: Secondary | ICD-10-CM | POA: Diagnosis not present

## 2018-10-31 DIAGNOSIS — I10 Essential (primary) hypertension: Secondary | ICD-10-CM | POA: Diagnosis not present

## 2018-10-31 DIAGNOSIS — T451X5A Adverse effect of antineoplastic and immunosuppressive drugs, initial encounter: Secondary | ICD-10-CM | POA: Diagnosis not present

## 2018-10-31 DIAGNOSIS — D473 Essential (hemorrhagic) thrombocythemia: Secondary | ICD-10-CM | POA: Diagnosis not present

## 2018-11-01 DIAGNOSIS — C773 Secondary and unspecified malignant neoplasm of axilla and upper limb lymph nodes: Secondary | ICD-10-CM | POA: Diagnosis not present

## 2018-11-01 DIAGNOSIS — C50911 Malignant neoplasm of unspecified site of right female breast: Secondary | ICD-10-CM | POA: Diagnosis not present

## 2018-11-01 DIAGNOSIS — Z9889 Other specified postprocedural states: Secondary | ICD-10-CM | POA: Diagnosis not present

## 2018-11-01 DIAGNOSIS — B349 Viral infection, unspecified: Secondary | ICD-10-CM | POA: Diagnosis not present

## 2018-11-01 DIAGNOSIS — R6889 Other general symptoms and signs: Secondary | ICD-10-CM | POA: Diagnosis not present

## 2018-11-01 DIAGNOSIS — Z171 Estrogen receptor negative status [ER-]: Secondary | ICD-10-CM | POA: Diagnosis not present

## 2018-11-01 DIAGNOSIS — C50411 Malignant neoplasm of upper-outer quadrant of right female breast: Secondary | ICD-10-CM | POA: Diagnosis not present

## 2018-11-08 DIAGNOSIS — C50411 Malignant neoplasm of upper-outer quadrant of right female breast: Secondary | ICD-10-CM | POA: Diagnosis not present

## 2018-11-09 DIAGNOSIS — Z510 Encounter for antineoplastic radiation therapy: Secondary | ICD-10-CM | POA: Diagnosis not present

## 2018-11-09 DIAGNOSIS — C773 Secondary and unspecified malignant neoplasm of axilla and upper limb lymph nodes: Secondary | ICD-10-CM | POA: Diagnosis not present

## 2018-11-09 DIAGNOSIS — C50411 Malignant neoplasm of upper-outer quadrant of right female breast: Secondary | ICD-10-CM | POA: Diagnosis not present

## 2018-11-09 DIAGNOSIS — C50911 Malignant neoplasm of unspecified site of right female breast: Secondary | ICD-10-CM | POA: Diagnosis not present

## 2018-11-20 DIAGNOSIS — C773 Secondary and unspecified malignant neoplasm of axilla and upper limb lymph nodes: Secondary | ICD-10-CM | POA: Diagnosis not present

## 2018-11-20 DIAGNOSIS — N6459 Other signs and symptoms in breast: Secondary | ICD-10-CM | POA: Diagnosis not present

## 2018-11-20 DIAGNOSIS — I10 Essential (primary) hypertension: Secondary | ICD-10-CM | POA: Diagnosis not present

## 2018-11-20 DIAGNOSIS — J329 Chronic sinusitis, unspecified: Secondary | ICD-10-CM | POA: Diagnosis not present

## 2018-11-20 DIAGNOSIS — Z95828 Presence of other vascular implants and grafts: Secondary | ICD-10-CM | POA: Diagnosis not present

## 2018-11-20 DIAGNOSIS — C50911 Malignant neoplasm of unspecified site of right female breast: Secondary | ICD-10-CM | POA: Diagnosis not present

## 2018-11-20 DIAGNOSIS — Z452 Encounter for adjustment and management of vascular access device: Secondary | ICD-10-CM | POA: Diagnosis not present

## 2018-11-26 DIAGNOSIS — C50411 Malignant neoplasm of upper-outer quadrant of right female breast: Secondary | ICD-10-CM | POA: Diagnosis not present

## 2018-11-26 DIAGNOSIS — Z510 Encounter for antineoplastic radiation therapy: Secondary | ICD-10-CM | POA: Diagnosis not present

## 2018-11-27 DIAGNOSIS — C50411 Malignant neoplasm of upper-outer quadrant of right female breast: Secondary | ICD-10-CM | POA: Diagnosis not present

## 2018-11-27 DIAGNOSIS — Z510 Encounter for antineoplastic radiation therapy: Secondary | ICD-10-CM | POA: Diagnosis not present

## 2018-11-28 DIAGNOSIS — Z510 Encounter for antineoplastic radiation therapy: Secondary | ICD-10-CM | POA: Diagnosis not present

## 2018-11-28 DIAGNOSIS — C50411 Malignant neoplasm of upper-outer quadrant of right female breast: Secondary | ICD-10-CM | POA: Diagnosis not present

## 2018-11-29 DIAGNOSIS — Z510 Encounter for antineoplastic radiation therapy: Secondary | ICD-10-CM | POA: Diagnosis not present

## 2018-11-29 DIAGNOSIS — C50411 Malignant neoplasm of upper-outer quadrant of right female breast: Secondary | ICD-10-CM | POA: Diagnosis not present

## 2018-11-30 DIAGNOSIS — C50411 Malignant neoplasm of upper-outer quadrant of right female breast: Secondary | ICD-10-CM | POA: Diagnosis not present

## 2018-11-30 DIAGNOSIS — Z510 Encounter for antineoplastic radiation therapy: Secondary | ICD-10-CM | POA: Diagnosis not present

## 2018-12-03 DIAGNOSIS — C50411 Malignant neoplasm of upper-outer quadrant of right female breast: Secondary | ICD-10-CM | POA: Diagnosis not present

## 2018-12-03 DIAGNOSIS — Z510 Encounter for antineoplastic radiation therapy: Secondary | ICD-10-CM | POA: Diagnosis not present

## 2018-12-04 DIAGNOSIS — C50411 Malignant neoplasm of upper-outer quadrant of right female breast: Secondary | ICD-10-CM | POA: Diagnosis not present

## 2018-12-04 DIAGNOSIS — Z510 Encounter for antineoplastic radiation therapy: Secondary | ICD-10-CM | POA: Diagnosis not present

## 2018-12-05 DIAGNOSIS — Z510 Encounter for antineoplastic radiation therapy: Secondary | ICD-10-CM | POA: Diagnosis not present

## 2018-12-05 DIAGNOSIS — C50411 Malignant neoplasm of upper-outer quadrant of right female breast: Secondary | ICD-10-CM | POA: Diagnosis not present

## 2018-12-06 DIAGNOSIS — R928 Other abnormal and inconclusive findings on diagnostic imaging of breast: Secondary | ICD-10-CM | POA: Diagnosis not present

## 2018-12-06 DIAGNOSIS — Z510 Encounter for antineoplastic radiation therapy: Secondary | ICD-10-CM | POA: Diagnosis not present

## 2018-12-06 DIAGNOSIS — C50911 Malignant neoplasm of unspecified site of right female breast: Secondary | ICD-10-CM | POA: Diagnosis not present

## 2018-12-06 DIAGNOSIS — C773 Secondary and unspecified malignant neoplasm of axilla and upper limb lymph nodes: Secondary | ICD-10-CM | POA: Diagnosis not present

## 2018-12-06 DIAGNOSIS — N6459 Other signs and symptoms in breast: Secondary | ICD-10-CM | POA: Diagnosis not present

## 2018-12-06 DIAGNOSIS — C50411 Malignant neoplasm of upper-outer quadrant of right female breast: Secondary | ICD-10-CM | POA: Diagnosis not present

## 2018-12-07 DIAGNOSIS — C50411 Malignant neoplasm of upper-outer quadrant of right female breast: Secondary | ICD-10-CM | POA: Diagnosis not present

## 2018-12-07 DIAGNOSIS — Z510 Encounter for antineoplastic radiation therapy: Secondary | ICD-10-CM | POA: Diagnosis not present

## 2018-12-10 DIAGNOSIS — C50411 Malignant neoplasm of upper-outer quadrant of right female breast: Secondary | ICD-10-CM | POA: Diagnosis not present

## 2018-12-10 DIAGNOSIS — Z510 Encounter for antineoplastic radiation therapy: Secondary | ICD-10-CM | POA: Diagnosis not present

## 2018-12-11 DIAGNOSIS — C50411 Malignant neoplasm of upper-outer quadrant of right female breast: Secondary | ICD-10-CM | POA: Diagnosis not present

## 2018-12-11 DIAGNOSIS — Z510 Encounter for antineoplastic radiation therapy: Secondary | ICD-10-CM | POA: Diagnosis not present

## 2018-12-12 DIAGNOSIS — C773 Secondary and unspecified malignant neoplasm of axilla and upper limb lymph nodes: Secondary | ICD-10-CM | POA: Diagnosis not present

## 2018-12-12 DIAGNOSIS — Z452 Encounter for adjustment and management of vascular access device: Secondary | ICD-10-CM | POA: Diagnosis not present

## 2018-12-12 DIAGNOSIS — I10 Essential (primary) hypertension: Secondary | ICD-10-CM | POA: Diagnosis not present

## 2018-12-12 DIAGNOSIS — N6459 Other signs and symptoms in breast: Secondary | ICD-10-CM | POA: Diagnosis not present

## 2018-12-12 DIAGNOSIS — J329 Chronic sinusitis, unspecified: Secondary | ICD-10-CM | POA: Diagnosis not present

## 2018-12-12 DIAGNOSIS — Z510 Encounter for antineoplastic radiation therapy: Secondary | ICD-10-CM | POA: Diagnosis not present

## 2018-12-12 DIAGNOSIS — C50911 Malignant neoplasm of unspecified site of right female breast: Secondary | ICD-10-CM | POA: Diagnosis not present

## 2018-12-12 DIAGNOSIS — C50411 Malignant neoplasm of upper-outer quadrant of right female breast: Secondary | ICD-10-CM | POA: Diagnosis not present

## 2018-12-13 DIAGNOSIS — C50411 Malignant neoplasm of upper-outer quadrant of right female breast: Secondary | ICD-10-CM | POA: Diagnosis not present

## 2018-12-13 DIAGNOSIS — Z510 Encounter for antineoplastic radiation therapy: Secondary | ICD-10-CM | POA: Diagnosis not present

## 2018-12-14 DIAGNOSIS — Z510 Encounter for antineoplastic radiation therapy: Secondary | ICD-10-CM | POA: Diagnosis not present

## 2018-12-14 DIAGNOSIS — C50411 Malignant neoplasm of upper-outer quadrant of right female breast: Secondary | ICD-10-CM | POA: Diagnosis not present

## 2018-12-17 DIAGNOSIS — C50411 Malignant neoplasm of upper-outer quadrant of right female breast: Secondary | ICD-10-CM | POA: Diagnosis not present

## 2018-12-17 DIAGNOSIS — Z510 Encounter for antineoplastic radiation therapy: Secondary | ICD-10-CM | POA: Diagnosis not present

## 2018-12-18 DIAGNOSIS — Z510 Encounter for antineoplastic radiation therapy: Secondary | ICD-10-CM | POA: Diagnosis not present

## 2018-12-18 DIAGNOSIS — C50411 Malignant neoplasm of upper-outer quadrant of right female breast: Secondary | ICD-10-CM | POA: Diagnosis not present

## 2018-12-19 DIAGNOSIS — Z510 Encounter for antineoplastic radiation therapy: Secondary | ICD-10-CM | POA: Diagnosis not present

## 2018-12-19 DIAGNOSIS — C50411 Malignant neoplasm of upper-outer quadrant of right female breast: Secondary | ICD-10-CM | POA: Diagnosis not present

## 2018-12-20 DIAGNOSIS — C50411 Malignant neoplasm of upper-outer quadrant of right female breast: Secondary | ICD-10-CM | POA: Diagnosis not present

## 2018-12-20 DIAGNOSIS — Z510 Encounter for antineoplastic radiation therapy: Secondary | ICD-10-CM | POA: Diagnosis not present

## 2018-12-21 DIAGNOSIS — Z510 Encounter for antineoplastic radiation therapy: Secondary | ICD-10-CM | POA: Diagnosis not present

## 2018-12-21 DIAGNOSIS — C50411 Malignant neoplasm of upper-outer quadrant of right female breast: Secondary | ICD-10-CM | POA: Diagnosis not present

## 2018-12-24 DIAGNOSIS — C50411 Malignant neoplasm of upper-outer quadrant of right female breast: Secondary | ICD-10-CM | POA: Diagnosis not present

## 2018-12-24 DIAGNOSIS — Z510 Encounter for antineoplastic radiation therapy: Secondary | ICD-10-CM | POA: Diagnosis not present

## 2018-12-25 DIAGNOSIS — C50411 Malignant neoplasm of upper-outer quadrant of right female breast: Secondary | ICD-10-CM | POA: Diagnosis not present

## 2018-12-25 DIAGNOSIS — Z510 Encounter for antineoplastic radiation therapy: Secondary | ICD-10-CM | POA: Diagnosis not present

## 2018-12-26 DIAGNOSIS — C50411 Malignant neoplasm of upper-outer quadrant of right female breast: Secondary | ICD-10-CM | POA: Diagnosis not present

## 2018-12-26 DIAGNOSIS — Z510 Encounter for antineoplastic radiation therapy: Secondary | ICD-10-CM | POA: Diagnosis not present

## 2018-12-27 DIAGNOSIS — C50411 Malignant neoplasm of upper-outer quadrant of right female breast: Secondary | ICD-10-CM | POA: Diagnosis not present

## 2018-12-27 DIAGNOSIS — Z510 Encounter for antineoplastic radiation therapy: Secondary | ICD-10-CM | POA: Diagnosis not present

## 2018-12-31 DIAGNOSIS — Z510 Encounter for antineoplastic radiation therapy: Secondary | ICD-10-CM | POA: Diagnosis not present

## 2018-12-31 DIAGNOSIS — C50411 Malignant neoplasm of upper-outer quadrant of right female breast: Secondary | ICD-10-CM | POA: Diagnosis not present

## 2019-01-01 DIAGNOSIS — Z510 Encounter for antineoplastic radiation therapy: Secondary | ICD-10-CM | POA: Diagnosis not present

## 2019-01-01 DIAGNOSIS — C50411 Malignant neoplasm of upper-outer quadrant of right female breast: Secondary | ICD-10-CM | POA: Diagnosis not present

## 2019-01-02 DIAGNOSIS — Z510 Encounter for antineoplastic radiation therapy: Secondary | ICD-10-CM | POA: Diagnosis not present

## 2019-01-02 DIAGNOSIS — C50411 Malignant neoplasm of upper-outer quadrant of right female breast: Secondary | ICD-10-CM | POA: Diagnosis not present

## 2019-01-04 DIAGNOSIS — Z510 Encounter for antineoplastic radiation therapy: Secondary | ICD-10-CM | POA: Diagnosis not present

## 2019-01-04 DIAGNOSIS — C50411 Malignant neoplasm of upper-outer quadrant of right female breast: Secondary | ICD-10-CM | POA: Diagnosis not present

## 2019-01-07 DIAGNOSIS — C50411 Malignant neoplasm of upper-outer quadrant of right female breast: Secondary | ICD-10-CM | POA: Diagnosis not present

## 2019-01-07 DIAGNOSIS — Z510 Encounter for antineoplastic radiation therapy: Secondary | ICD-10-CM | POA: Diagnosis not present

## 2019-01-08 DIAGNOSIS — Z510 Encounter for antineoplastic radiation therapy: Secondary | ICD-10-CM | POA: Diagnosis not present

## 2019-01-08 DIAGNOSIS — C50411 Malignant neoplasm of upper-outer quadrant of right female breast: Secondary | ICD-10-CM | POA: Diagnosis not present

## 2019-01-09 DIAGNOSIS — Z510 Encounter for antineoplastic radiation therapy: Secondary | ICD-10-CM | POA: Diagnosis not present

## 2019-01-09 DIAGNOSIS — C50411 Malignant neoplasm of upper-outer quadrant of right female breast: Secondary | ICD-10-CM | POA: Diagnosis not present

## 2019-01-23 DIAGNOSIS — T451X5A Adverse effect of antineoplastic and immunosuppressive drugs, initial encounter: Secondary | ICD-10-CM | POA: Diagnosis not present

## 2019-01-23 DIAGNOSIS — N6459 Other signs and symptoms in breast: Secondary | ICD-10-CM | POA: Diagnosis not present

## 2019-01-23 DIAGNOSIS — C773 Secondary and unspecified malignant neoplasm of axilla and upper limb lymph nodes: Secondary | ICD-10-CM | POA: Diagnosis not present

## 2019-01-23 DIAGNOSIS — Z8572 Personal history of non-Hodgkin lymphomas: Secondary | ICD-10-CM | POA: Diagnosis not present

## 2019-01-23 DIAGNOSIS — G620 Drug-induced polyneuropathy: Secondary | ICD-10-CM | POA: Diagnosis not present

## 2019-01-23 DIAGNOSIS — Z08 Encounter for follow-up examination after completed treatment for malignant neoplasm: Secondary | ICD-10-CM | POA: Diagnosis not present

## 2019-01-23 DIAGNOSIS — C50911 Malignant neoplasm of unspecified site of right female breast: Secondary | ICD-10-CM | POA: Diagnosis not present

## 2019-01-23 DIAGNOSIS — Z95828 Presence of other vascular implants and grafts: Secondary | ICD-10-CM | POA: Diagnosis not present

## 2019-01-23 DIAGNOSIS — Z853 Personal history of malignant neoplasm of breast: Secondary | ICD-10-CM | POA: Diagnosis not present

## 2019-02-17 DIAGNOSIS — K7689 Other specified diseases of liver: Secondary | ICD-10-CM | POA: Diagnosis not present

## 2019-02-17 DIAGNOSIS — R339 Retention of urine, unspecified: Secondary | ICD-10-CM | POA: Diagnosis not present

## 2019-02-17 DIAGNOSIS — D497 Neoplasm of unspecified behavior of endocrine glands and other parts of nervous system: Secondary | ICD-10-CM | POA: Diagnosis not present

## 2019-02-17 DIAGNOSIS — C7940 Secondary malignant neoplasm of unspecified part of nervous system: Secondary | ICD-10-CM | POA: Diagnosis not present

## 2019-02-17 DIAGNOSIS — N39490 Overflow incontinence: Secondary | ICD-10-CM | POA: Diagnosis not present

## 2019-02-17 DIAGNOSIS — M4726 Other spondylosis with radiculopathy, lumbar region: Secondary | ICD-10-CM | POA: Diagnosis not present

## 2019-02-17 DIAGNOSIS — C7949 Secondary malignant neoplasm of other parts of nervous system: Secondary | ICD-10-CM | POA: Diagnosis not present

## 2019-02-17 DIAGNOSIS — R918 Other nonspecific abnormal finding of lung field: Secondary | ICD-10-CM | POA: Diagnosis not present

## 2019-02-17 DIAGNOSIS — M5124 Other intervertebral disc displacement, thoracic region: Secondary | ICD-10-CM | POA: Diagnosis not present

## 2019-02-17 DIAGNOSIS — C50411 Malignant neoplasm of upper-outer quadrant of right female breast: Secondary | ICD-10-CM | POA: Diagnosis not present

## 2019-02-17 DIAGNOSIS — I10 Essential (primary) hypertension: Secondary | ICD-10-CM | POA: Diagnosis not present

## 2019-02-17 DIAGNOSIS — M4804 Spinal stenosis, thoracic region: Secondary | ICD-10-CM | POA: Diagnosis not present

## 2019-02-17 DIAGNOSIS — Z853 Personal history of malignant neoplasm of breast: Secondary | ICD-10-CM | POA: Diagnosis not present

## 2019-02-17 DIAGNOSIS — C50811 Malignant neoplasm of overlapping sites of right female breast: Secondary | ICD-10-CM | POA: Diagnosis not present

## 2019-02-17 DIAGNOSIS — M47814 Spondylosis without myelopathy or radiculopathy, thoracic region: Secondary | ICD-10-CM | POA: Diagnosis not present

## 2019-02-17 DIAGNOSIS — Z6829 Body mass index (BMI) 29.0-29.9, adult: Secondary | ICD-10-CM | POA: Diagnosis not present

## 2019-02-17 DIAGNOSIS — G629 Polyneuropathy, unspecified: Secondary | ICD-10-CM | POA: Diagnosis not present

## 2019-02-17 DIAGNOSIS — M48061 Spinal stenosis, lumbar region without neurogenic claudication: Secondary | ICD-10-CM | POA: Diagnosis not present

## 2019-02-17 DIAGNOSIS — Z79899 Other long term (current) drug therapy: Secondary | ICD-10-CM | POA: Diagnosis not present

## 2019-02-17 DIAGNOSIS — J320 Chronic maxillary sinusitis: Secondary | ICD-10-CM | POA: Diagnosis not present

## 2019-02-17 DIAGNOSIS — N319 Neuromuscular dysfunction of bladder, unspecified: Secondary | ICD-10-CM | POA: Diagnosis not present

## 2019-02-17 DIAGNOSIS — R279 Unspecified lack of coordination: Secondary | ICD-10-CM | POA: Diagnosis not present

## 2019-02-17 DIAGNOSIS — M5116 Intervertebral disc disorders with radiculopathy, lumbar region: Secondary | ICD-10-CM | POA: Diagnosis not present

## 2019-02-17 DIAGNOSIS — Z171 Estrogen receptor negative status [ER-]: Secondary | ICD-10-CM | POA: Diagnosis not present

## 2019-02-17 DIAGNOSIS — C412 Malignant neoplasm of vertebral column: Secondary | ICD-10-CM | POA: Diagnosis not present

## 2019-02-17 DIAGNOSIS — C50919 Malignant neoplasm of unspecified site of unspecified female breast: Secondary | ICD-10-CM | POA: Diagnosis not present

## 2019-02-17 DIAGNOSIS — Z1159 Encounter for screening for other viral diseases: Secondary | ICD-10-CM | POA: Diagnosis not present

## 2019-02-17 DIAGNOSIS — D259 Leiomyoma of uterus, unspecified: Secondary | ICD-10-CM | POA: Diagnosis not present

## 2019-02-17 DIAGNOSIS — N281 Cyst of kidney, acquired: Secondary | ICD-10-CM | POA: Diagnosis not present

## 2019-02-17 DIAGNOSIS — M79605 Pain in left leg: Secondary | ICD-10-CM | POA: Diagnosis not present

## 2019-02-17 DIAGNOSIS — Z923 Personal history of irradiation: Secondary | ICD-10-CM | POA: Diagnosis not present

## 2019-02-17 DIAGNOSIS — C7931 Secondary malignant neoplasm of brain: Secondary | ICD-10-CM | POA: Diagnosis not present

## 2019-02-17 DIAGNOSIS — G959 Disease of spinal cord, unspecified: Secondary | ICD-10-CM | POA: Diagnosis not present

## 2019-02-17 DIAGNOSIS — Z743 Need for continuous supervision: Secondary | ICD-10-CM | POA: Diagnosis not present

## 2019-02-17 DIAGNOSIS — R5381 Other malaise: Secondary | ICD-10-CM | POA: Diagnosis not present

## 2019-02-17 DIAGNOSIS — F418 Other specified anxiety disorders: Secondary | ICD-10-CM | POA: Diagnosis not present

## 2019-02-17 DIAGNOSIS — E669 Obesity, unspecified: Secondary | ICD-10-CM | POA: Diagnosis not present

## 2019-02-18 DIAGNOSIS — R279 Unspecified lack of coordination: Secondary | ICD-10-CM | POA: Diagnosis not present

## 2019-02-18 DIAGNOSIS — Z743 Need for continuous supervision: Secondary | ICD-10-CM | POA: Diagnosis not present

## 2019-02-18 DIAGNOSIS — R5381 Other malaise: Secondary | ICD-10-CM | POA: Diagnosis not present

## 2019-02-19 DIAGNOSIS — D497 Neoplasm of unspecified behavior of endocrine glands and other parts of nervous system: Secondary | ICD-10-CM | POA: Diagnosis not present

## 2019-02-19 DIAGNOSIS — C50919 Malignant neoplasm of unspecified site of unspecified female breast: Secondary | ICD-10-CM | POA: Diagnosis not present

## 2019-02-19 DIAGNOSIS — C7931 Secondary malignant neoplasm of brain: Secondary | ICD-10-CM | POA: Diagnosis not present

## 2019-02-19 DIAGNOSIS — C7949 Secondary malignant neoplasm of other parts of nervous system: Secondary | ICD-10-CM | POA: Diagnosis not present

## 2019-02-19 DIAGNOSIS — Z171 Estrogen receptor negative status [ER-]: Secondary | ICD-10-CM | POA: Diagnosis not present

## 2019-02-19 DIAGNOSIS — M79605 Pain in left leg: Secondary | ICD-10-CM | POA: Diagnosis not present

## 2019-02-19 DIAGNOSIS — C412 Malignant neoplasm of vertebral column: Secondary | ICD-10-CM | POA: Diagnosis not present

## 2019-02-20 DIAGNOSIS — D497 Neoplasm of unspecified behavior of endocrine glands and other parts of nervous system: Secondary | ICD-10-CM | POA: Diagnosis not present

## 2019-02-21 DIAGNOSIS — J320 Chronic maxillary sinusitis: Secondary | ICD-10-CM | POA: Diagnosis not present

## 2019-02-21 DIAGNOSIS — D259 Leiomyoma of uterus, unspecified: Secondary | ICD-10-CM | POA: Diagnosis not present

## 2019-02-21 DIAGNOSIS — D497 Neoplasm of unspecified behavior of endocrine glands and other parts of nervous system: Secondary | ICD-10-CM | POA: Diagnosis not present

## 2019-02-21 DIAGNOSIS — R918 Other nonspecific abnormal finding of lung field: Secondary | ICD-10-CM | POA: Diagnosis not present

## 2019-02-21 DIAGNOSIS — K7689 Other specified diseases of liver: Secondary | ICD-10-CM | POA: Diagnosis not present

## 2019-02-21 DIAGNOSIS — C50919 Malignant neoplasm of unspecified site of unspecified female breast: Secondary | ICD-10-CM | POA: Diagnosis not present

## 2019-02-21 DIAGNOSIS — N281 Cyst of kidney, acquired: Secondary | ICD-10-CM | POA: Diagnosis not present

## 2019-02-22 DIAGNOSIS — C50411 Malignant neoplasm of upper-outer quadrant of right female breast: Secondary | ICD-10-CM | POA: Diagnosis not present

## 2019-02-22 DIAGNOSIS — D497 Neoplasm of unspecified behavior of endocrine glands and other parts of nervous system: Secondary | ICD-10-CM | POA: Diagnosis not present

## 2019-02-22 DIAGNOSIS — C7949 Secondary malignant neoplasm of other parts of nervous system: Secondary | ICD-10-CM | POA: Diagnosis not present

## 2019-02-22 DIAGNOSIS — Z923 Personal history of irradiation: Secondary | ICD-10-CM | POA: Diagnosis not present

## 2019-02-23 DIAGNOSIS — C7949 Secondary malignant neoplasm of other parts of nervous system: Secondary | ICD-10-CM | POA: Diagnosis not present

## 2019-02-23 DIAGNOSIS — C50919 Malignant neoplasm of unspecified site of unspecified female breast: Secondary | ICD-10-CM | POA: Diagnosis not present

## 2019-02-25 DIAGNOSIS — D497 Neoplasm of unspecified behavior of endocrine glands and other parts of nervous system: Secondary | ICD-10-CM | POA: Diagnosis not present

## 2019-02-26 DIAGNOSIS — D497 Neoplasm of unspecified behavior of endocrine glands and other parts of nervous system: Secondary | ICD-10-CM | POA: Diagnosis not present

## 2019-02-27 DIAGNOSIS — D497 Neoplasm of unspecified behavior of endocrine glands and other parts of nervous system: Secondary | ICD-10-CM | POA: Diagnosis not present

## 2019-02-28 DIAGNOSIS — D497 Neoplasm of unspecified behavior of endocrine glands and other parts of nervous system: Secondary | ICD-10-CM | POA: Diagnosis not present

## 2019-03-01 DIAGNOSIS — D497 Neoplasm of unspecified behavior of endocrine glands and other parts of nervous system: Secondary | ICD-10-CM | POA: Diagnosis not present

## 2019-03-03 DIAGNOSIS — D497 Neoplasm of unspecified behavior of endocrine glands and other parts of nervous system: Secondary | ICD-10-CM | POA: Diagnosis not present

## 2019-03-04 DIAGNOSIS — C7949 Secondary malignant neoplasm of other parts of nervous system: Secondary | ICD-10-CM | POA: Diagnosis not present

## 2019-03-04 DIAGNOSIS — R338 Other retention of urine: Secondary | ICD-10-CM | POA: Diagnosis not present

## 2019-03-04 DIAGNOSIS — C50411 Malignant neoplasm of upper-outer quadrant of right female breast: Secondary | ICD-10-CM | POA: Diagnosis not present

## 2019-03-04 DIAGNOSIS — K5900 Constipation, unspecified: Secondary | ICD-10-CM | POA: Diagnosis not present

## 2019-03-04 DIAGNOSIS — G8220 Paraplegia, unspecified: Secondary | ICD-10-CM | POA: Diagnosis not present

## 2019-03-04 DIAGNOSIS — E785 Hyperlipidemia, unspecified: Secondary | ICD-10-CM | POA: Diagnosis not present

## 2019-03-04 DIAGNOSIS — Z483 Aftercare following surgery for neoplasm: Secondary | ICD-10-CM | POA: Diagnosis not present

## 2019-03-04 DIAGNOSIS — D497 Neoplasm of unspecified behavior of endocrine glands and other parts of nervous system: Secondary | ICD-10-CM | POA: Diagnosis not present

## 2019-03-04 DIAGNOSIS — E876 Hypokalemia: Secondary | ICD-10-CM | POA: Diagnosis not present

## 2019-03-04 DIAGNOSIS — N319 Neuromuscular dysfunction of bladder, unspecified: Secondary | ICD-10-CM | POA: Diagnosis not present

## 2019-03-04 DIAGNOSIS — R339 Retention of urine, unspecified: Secondary | ICD-10-CM | POA: Diagnosis not present

## 2019-03-04 DIAGNOSIS — G7281 Critical illness myopathy: Secondary | ICD-10-CM | POA: Diagnosis not present

## 2019-03-04 DIAGNOSIS — C50911 Malignant neoplasm of unspecified site of right female breast: Secondary | ICD-10-CM | POA: Diagnosis not present

## 2019-03-04 DIAGNOSIS — T451X5D Adverse effect of antineoplastic and immunosuppressive drugs, subsequent encounter: Secondary | ICD-10-CM | POA: Diagnosis not present

## 2019-03-04 DIAGNOSIS — Z743 Need for continuous supervision: Secondary | ICD-10-CM | POA: Diagnosis not present

## 2019-03-04 DIAGNOSIS — R279 Unspecified lack of coordination: Secondary | ICD-10-CM | POA: Diagnosis not present

## 2019-03-04 DIAGNOSIS — Z510 Encounter for antineoplastic radiation therapy: Secondary | ICD-10-CM | POA: Diagnosis not present

## 2019-03-04 DIAGNOSIS — K592 Neurogenic bowel, not elsewhere classified: Secondary | ICD-10-CM | POA: Diagnosis not present

## 2019-03-04 DIAGNOSIS — C773 Secondary and unspecified malignant neoplasm of axilla and upper limb lymph nodes: Secondary | ICD-10-CM | POA: Diagnosis not present

## 2019-03-04 DIAGNOSIS — R5381 Other malaise: Secondary | ICD-10-CM | POA: Diagnosis not present

## 2019-03-04 DIAGNOSIS — C50919 Malignant neoplasm of unspecified site of unspecified female breast: Secondary | ICD-10-CM | POA: Diagnosis not present

## 2019-03-04 DIAGNOSIS — C7940 Secondary malignant neoplasm of unspecified part of nervous system: Secondary | ICD-10-CM | POA: Diagnosis not present

## 2019-03-04 DIAGNOSIS — I10 Essential (primary) hypertension: Secondary | ICD-10-CM | POA: Diagnosis not present

## 2019-03-04 DIAGNOSIS — M545 Low back pain: Secondary | ICD-10-CM | POA: Diagnosis not present

## 2019-03-04 DIAGNOSIS — G620 Drug-induced polyneuropathy: Secondary | ICD-10-CM | POA: Diagnosis not present

## 2019-03-05 DIAGNOSIS — C50911 Malignant neoplasm of unspecified site of right female breast: Secondary | ICD-10-CM | POA: Diagnosis not present

## 2019-03-05 DIAGNOSIS — G620 Drug-induced polyneuropathy: Secondary | ICD-10-CM | POA: Diagnosis not present

## 2019-03-05 DIAGNOSIS — D497 Neoplasm of unspecified behavior of endocrine glands and other parts of nervous system: Secondary | ICD-10-CM | POA: Diagnosis not present

## 2019-03-05 DIAGNOSIS — I10 Essential (primary) hypertension: Secondary | ICD-10-CM | POA: Diagnosis not present

## 2019-03-06 DIAGNOSIS — C50911 Malignant neoplasm of unspecified site of right female breast: Secondary | ICD-10-CM | POA: Diagnosis not present

## 2019-03-06 DIAGNOSIS — G620 Drug-induced polyneuropathy: Secondary | ICD-10-CM | POA: Diagnosis not present

## 2019-03-06 DIAGNOSIS — R339 Retention of urine, unspecified: Secondary | ICD-10-CM | POA: Diagnosis not present

## 2019-03-06 DIAGNOSIS — K5900 Constipation, unspecified: Secondary | ICD-10-CM | POA: Diagnosis not present

## 2019-03-06 DIAGNOSIS — D497 Neoplasm of unspecified behavior of endocrine glands and other parts of nervous system: Secondary | ICD-10-CM | POA: Diagnosis not present

## 2019-03-06 DIAGNOSIS — I10 Essential (primary) hypertension: Secondary | ICD-10-CM | POA: Diagnosis not present

## 2019-03-07 DIAGNOSIS — I10 Essential (primary) hypertension: Secondary | ICD-10-CM | POA: Diagnosis not present

## 2019-03-07 DIAGNOSIS — R339 Retention of urine, unspecified: Secondary | ICD-10-CM | POA: Diagnosis not present

## 2019-03-07 DIAGNOSIS — G620 Drug-induced polyneuropathy: Secondary | ICD-10-CM | POA: Diagnosis not present

## 2019-03-07 DIAGNOSIS — D497 Neoplasm of unspecified behavior of endocrine glands and other parts of nervous system: Secondary | ICD-10-CM | POA: Diagnosis not present

## 2019-03-08 DIAGNOSIS — R339 Retention of urine, unspecified: Secondary | ICD-10-CM | POA: Diagnosis not present

## 2019-03-08 DIAGNOSIS — I10 Essential (primary) hypertension: Secondary | ICD-10-CM | POA: Diagnosis not present

## 2019-03-08 DIAGNOSIS — G620 Drug-induced polyneuropathy: Secondary | ICD-10-CM | POA: Diagnosis not present

## 2019-03-08 DIAGNOSIS — D497 Neoplasm of unspecified behavior of endocrine glands and other parts of nervous system: Secondary | ICD-10-CM | POA: Diagnosis not present

## 2019-03-10 DIAGNOSIS — C773 Secondary and unspecified malignant neoplasm of axilla and upper limb lymph nodes: Secondary | ICD-10-CM | POA: Diagnosis not present

## 2019-03-10 DIAGNOSIS — I10 Essential (primary) hypertension: Secondary | ICD-10-CM | POA: Diagnosis not present

## 2019-03-10 DIAGNOSIS — C50911 Malignant neoplasm of unspecified site of right female breast: Secondary | ICD-10-CM | POA: Diagnosis not present

## 2019-03-10 DIAGNOSIS — R339 Retention of urine, unspecified: Secondary | ICD-10-CM | POA: Diagnosis not present

## 2019-03-11 DIAGNOSIS — D497 Neoplasm of unspecified behavior of endocrine glands and other parts of nervous system: Secondary | ICD-10-CM | POA: Diagnosis not present

## 2019-03-11 DIAGNOSIS — I10 Essential (primary) hypertension: Secondary | ICD-10-CM | POA: Diagnosis not present

## 2019-03-11 DIAGNOSIS — C50911 Malignant neoplasm of unspecified site of right female breast: Secondary | ICD-10-CM | POA: Diagnosis not present

## 2019-03-11 DIAGNOSIS — G620 Drug-induced polyneuropathy: Secondary | ICD-10-CM | POA: Diagnosis not present

## 2019-03-12 DIAGNOSIS — I10 Essential (primary) hypertension: Secondary | ICD-10-CM | POA: Diagnosis not present

## 2019-03-12 DIAGNOSIS — C50911 Malignant neoplasm of unspecified site of right female breast: Secondary | ICD-10-CM | POA: Diagnosis not present

## 2019-03-12 DIAGNOSIS — D497 Neoplasm of unspecified behavior of endocrine glands and other parts of nervous system: Secondary | ICD-10-CM | POA: Diagnosis not present

## 2019-03-12 DIAGNOSIS — G620 Drug-induced polyneuropathy: Secondary | ICD-10-CM | POA: Diagnosis not present

## 2019-03-13 DIAGNOSIS — I10 Essential (primary) hypertension: Secondary | ICD-10-CM | POA: Diagnosis not present

## 2019-03-13 DIAGNOSIS — G620 Drug-induced polyneuropathy: Secondary | ICD-10-CM | POA: Diagnosis not present

## 2019-03-13 DIAGNOSIS — C50911 Malignant neoplasm of unspecified site of right female breast: Secondary | ICD-10-CM | POA: Diagnosis not present

## 2019-03-13 DIAGNOSIS — D497 Neoplasm of unspecified behavior of endocrine glands and other parts of nervous system: Secondary | ICD-10-CM | POA: Diagnosis not present

## 2019-03-14 DIAGNOSIS — D497 Neoplasm of unspecified behavior of endocrine glands and other parts of nervous system: Secondary | ICD-10-CM | POA: Diagnosis not present

## 2019-03-14 DIAGNOSIS — I10 Essential (primary) hypertension: Secondary | ICD-10-CM | POA: Diagnosis not present

## 2019-03-14 DIAGNOSIS — G620 Drug-induced polyneuropathy: Secondary | ICD-10-CM | POA: Diagnosis not present

## 2019-03-14 DIAGNOSIS — C50911 Malignant neoplasm of unspecified site of right female breast: Secondary | ICD-10-CM | POA: Diagnosis not present

## 2019-03-15 DIAGNOSIS — G620 Drug-induced polyneuropathy: Secondary | ICD-10-CM | POA: Diagnosis not present

## 2019-03-15 DIAGNOSIS — D497 Neoplasm of unspecified behavior of endocrine glands and other parts of nervous system: Secondary | ICD-10-CM | POA: Diagnosis not present

## 2019-03-15 DIAGNOSIS — C50911 Malignant neoplasm of unspecified site of right female breast: Secondary | ICD-10-CM | POA: Diagnosis not present

## 2019-03-15 DIAGNOSIS — I10 Essential (primary) hypertension: Secondary | ICD-10-CM | POA: Diagnosis not present

## 2019-03-17 DIAGNOSIS — C50911 Malignant neoplasm of unspecified site of right female breast: Secondary | ICD-10-CM | POA: Diagnosis not present

## 2019-03-17 DIAGNOSIS — G620 Drug-induced polyneuropathy: Secondary | ICD-10-CM | POA: Diagnosis not present

## 2019-03-17 DIAGNOSIS — D497 Neoplasm of unspecified behavior of endocrine glands and other parts of nervous system: Secondary | ICD-10-CM | POA: Diagnosis not present

## 2019-03-17 DIAGNOSIS — I10 Essential (primary) hypertension: Secondary | ICD-10-CM | POA: Diagnosis not present

## 2019-03-18 DIAGNOSIS — C50911 Malignant neoplasm of unspecified site of right female breast: Secondary | ICD-10-CM | POA: Diagnosis not present

## 2019-03-18 DIAGNOSIS — G620 Drug-induced polyneuropathy: Secondary | ICD-10-CM | POA: Diagnosis not present

## 2019-03-18 DIAGNOSIS — D497 Neoplasm of unspecified behavior of endocrine glands and other parts of nervous system: Secondary | ICD-10-CM | POA: Diagnosis not present

## 2019-03-18 DIAGNOSIS — I10 Essential (primary) hypertension: Secondary | ICD-10-CM | POA: Diagnosis not present

## 2019-03-19 DIAGNOSIS — D497 Neoplasm of unspecified behavior of endocrine glands and other parts of nervous system: Secondary | ICD-10-CM | POA: Diagnosis not present

## 2019-03-19 DIAGNOSIS — I10 Essential (primary) hypertension: Secondary | ICD-10-CM | POA: Diagnosis not present

## 2019-03-19 DIAGNOSIS — G620 Drug-induced polyneuropathy: Secondary | ICD-10-CM | POA: Diagnosis not present

## 2019-03-19 DIAGNOSIS — C50911 Malignant neoplasm of unspecified site of right female breast: Secondary | ICD-10-CM | POA: Diagnosis not present

## 2019-03-20 DIAGNOSIS — Z510 Encounter for antineoplastic radiation therapy: Secondary | ICD-10-CM | POA: Diagnosis not present

## 2019-03-20 DIAGNOSIS — C50411 Malignant neoplasm of upper-outer quadrant of right female breast: Secondary | ICD-10-CM | POA: Diagnosis not present

## 2019-03-20 DIAGNOSIS — C7940 Secondary malignant neoplasm of unspecified part of nervous system: Secondary | ICD-10-CM | POA: Diagnosis not present

## 2019-03-22 DIAGNOSIS — R000 Tachycardia, unspecified: Secondary | ICD-10-CM | POA: Diagnosis not present

## 2019-03-22 DIAGNOSIS — N390 Urinary tract infection, site not specified: Secondary | ICD-10-CM | POA: Diagnosis not present

## 2019-03-22 DIAGNOSIS — I2699 Other pulmonary embolism without acute cor pulmonale: Secondary | ICD-10-CM | POA: Diagnosis not present

## 2019-03-22 DIAGNOSIS — C50911 Malignant neoplasm of unspecified site of right female breast: Secondary | ICD-10-CM | POA: Diagnosis not present

## 2019-03-22 DIAGNOSIS — R0602 Shortness of breath: Secondary | ICD-10-CM | POA: Diagnosis not present

## 2019-03-22 DIAGNOSIS — E785 Hyperlipidemia, unspecified: Secondary | ICD-10-CM | POA: Diagnosis not present

## 2019-03-22 DIAGNOSIS — D649 Anemia, unspecified: Secondary | ICD-10-CM | POA: Diagnosis not present

## 2019-03-22 DIAGNOSIS — I10 Essential (primary) hypertension: Secondary | ICD-10-CM | POA: Diagnosis not present

## 2019-03-22 DIAGNOSIS — G620 Drug-induced polyneuropathy: Secondary | ICD-10-CM | POA: Diagnosis not present

## 2019-03-22 DIAGNOSIS — M545 Low back pain: Secondary | ICD-10-CM | POA: Diagnosis not present

## 2019-03-22 DIAGNOSIS — R339 Retention of urine, unspecified: Secondary | ICD-10-CM | POA: Diagnosis not present

## 2019-03-22 DIAGNOSIS — N3090 Cystitis, unspecified without hematuria: Secondary | ICD-10-CM | POA: Diagnosis not present

## 2019-03-22 DIAGNOSIS — G834 Cauda equina syndrome: Secondary | ICD-10-CM | POA: Diagnosis not present

## 2019-03-22 DIAGNOSIS — R509 Fever, unspecified: Secondary | ICD-10-CM | POA: Diagnosis not present

## 2019-03-22 DIAGNOSIS — C773 Secondary and unspecified malignant neoplasm of axilla and upper limb lymph nodes: Secondary | ICD-10-CM | POA: Diagnosis not present

## 2019-03-22 DIAGNOSIS — N3943 Post-void dribbling: Secondary | ICD-10-CM | POA: Diagnosis not present

## 2019-03-22 DIAGNOSIS — Z20828 Contact with and (suspected) exposure to other viral communicable diseases: Secondary | ICD-10-CM | POA: Diagnosis not present

## 2019-03-22 DIAGNOSIS — G9589 Other specified diseases of spinal cord: Secondary | ICD-10-CM | POA: Diagnosis not present

## 2019-03-22 DIAGNOSIS — Z79899 Other long term (current) drug therapy: Secondary | ICD-10-CM | POA: Diagnosis not present

## 2019-03-22 DIAGNOSIS — Z95828 Presence of other vascular implants and grafts: Secondary | ICD-10-CM | POA: Diagnosis not present

## 2019-03-22 DIAGNOSIS — N3001 Acute cystitis with hematuria: Secondary | ICD-10-CM | POA: Diagnosis not present

## 2019-03-22 DIAGNOSIS — T451X5A Adverse effect of antineoplastic and immunosuppressive drugs, initial encounter: Secondary | ICD-10-CM | POA: Diagnosis not present

## 2019-03-22 DIAGNOSIS — I2693 Single subsegmental pulmonary embolism without acute cor pulmonale: Secondary | ICD-10-CM | POA: Diagnosis not present

## 2019-03-22 DIAGNOSIS — D497 Neoplasm of unspecified behavior of endocrine glands and other parts of nervous system: Secondary | ICD-10-CM | POA: Diagnosis not present

## 2019-03-22 DIAGNOSIS — C7949 Secondary malignant neoplasm of other parts of nervous system: Secondary | ICD-10-CM | POA: Diagnosis not present

## 2019-03-22 DIAGNOSIS — G8220 Paraplegia, unspecified: Secondary | ICD-10-CM | POA: Diagnosis not present

## 2019-03-22 DIAGNOSIS — G7281 Critical illness myopathy: Secondary | ICD-10-CM | POA: Diagnosis not present

## 2019-03-25 DIAGNOSIS — G7281 Critical illness myopathy: Secondary | ICD-10-CM | POA: Diagnosis not present

## 2019-03-26 DIAGNOSIS — Z510 Encounter for antineoplastic radiation therapy: Secondary | ICD-10-CM | POA: Diagnosis not present

## 2019-03-26 DIAGNOSIS — C50411 Malignant neoplasm of upper-outer quadrant of right female breast: Secondary | ICD-10-CM | POA: Diagnosis not present

## 2019-03-26 DIAGNOSIS — C7940 Secondary malignant neoplasm of unspecified part of nervous system: Secondary | ICD-10-CM | POA: Diagnosis not present

## 2019-03-27 DIAGNOSIS — Z510 Encounter for antineoplastic radiation therapy: Secondary | ICD-10-CM | POA: Diagnosis not present

## 2019-03-27 DIAGNOSIS — C7940 Secondary malignant neoplasm of unspecified part of nervous system: Secondary | ICD-10-CM | POA: Diagnosis not present

## 2019-03-27 DIAGNOSIS — C50411 Malignant neoplasm of upper-outer quadrant of right female breast: Secondary | ICD-10-CM | POA: Diagnosis not present

## 2019-03-28 DIAGNOSIS — I10 Essential (primary) hypertension: Secondary | ICD-10-CM | POA: Diagnosis not present

## 2019-03-28 DIAGNOSIS — C50919 Malignant neoplasm of unspecified site of unspecified female breast: Secondary | ICD-10-CM | POA: Diagnosis not present

## 2019-03-28 DIAGNOSIS — I2692 Saddle embolus of pulmonary artery without acute cor pulmonale: Secondary | ICD-10-CM | POA: Diagnosis not present

## 2019-03-28 DIAGNOSIS — Z7901 Long term (current) use of anticoagulants: Secondary | ICD-10-CM | POA: Diagnosis not present

## 2019-03-28 DIAGNOSIS — G620 Drug-induced polyneuropathy: Secondary | ICD-10-CM | POA: Diagnosis not present

## 2019-03-28 DIAGNOSIS — C7949 Secondary malignant neoplasm of other parts of nervous system: Secondary | ICD-10-CM | POA: Diagnosis not present

## 2019-03-28 DIAGNOSIS — E785 Hyperlipidemia, unspecified: Secondary | ICD-10-CM | POA: Diagnosis not present

## 2019-03-28 DIAGNOSIS — C50411 Malignant neoplasm of upper-outer quadrant of right female breast: Secondary | ICD-10-CM | POA: Diagnosis not present

## 2019-03-28 DIAGNOSIS — T451X5S Adverse effect of antineoplastic and immunosuppressive drugs, sequela: Secondary | ICD-10-CM | POA: Diagnosis not present

## 2019-03-28 DIAGNOSIS — D649 Anemia, unspecified: Secondary | ICD-10-CM | POA: Diagnosis not present

## 2019-03-28 DIAGNOSIS — Z510 Encounter for antineoplastic radiation therapy: Secondary | ICD-10-CM | POA: Diagnosis not present

## 2019-03-28 DIAGNOSIS — G834 Cauda equina syndrome: Secondary | ICD-10-CM | POA: Diagnosis not present

## 2019-03-28 DIAGNOSIS — Z993 Dependence on wheelchair: Secondary | ICD-10-CM | POA: Diagnosis not present

## 2019-03-28 DIAGNOSIS — N390 Urinary tract infection, site not specified: Secondary | ICD-10-CM | POA: Diagnosis not present

## 2019-03-28 DIAGNOSIS — Z466 Encounter for fitting and adjustment of urinary device: Secondary | ICD-10-CM | POA: Diagnosis not present

## 2019-03-28 DIAGNOSIS — C7940 Secondary malignant neoplasm of unspecified part of nervous system: Secondary | ICD-10-CM | POA: Diagnosis not present

## 2019-03-29 DIAGNOSIS — C7949 Secondary malignant neoplasm of other parts of nervous system: Secondary | ICD-10-CM | POA: Diagnosis not present

## 2019-03-29 DIAGNOSIS — Z993 Dependence on wheelchair: Secondary | ICD-10-CM | POA: Diagnosis not present

## 2019-03-29 DIAGNOSIS — C7940 Secondary malignant neoplasm of unspecified part of nervous system: Secondary | ICD-10-CM | POA: Diagnosis not present

## 2019-03-29 DIAGNOSIS — Z7901 Long term (current) use of anticoagulants: Secondary | ICD-10-CM | POA: Diagnosis not present

## 2019-03-29 DIAGNOSIS — T451X5S Adverse effect of antineoplastic and immunosuppressive drugs, sequela: Secondary | ICD-10-CM | POA: Diagnosis not present

## 2019-03-29 DIAGNOSIS — C50919 Malignant neoplasm of unspecified site of unspecified female breast: Secondary | ICD-10-CM | POA: Diagnosis not present

## 2019-03-29 DIAGNOSIS — N390 Urinary tract infection, site not specified: Secondary | ICD-10-CM | POA: Diagnosis not present

## 2019-03-29 DIAGNOSIS — Z466 Encounter for fitting and adjustment of urinary device: Secondary | ICD-10-CM | POA: Diagnosis not present

## 2019-03-29 DIAGNOSIS — N319 Neuromuscular dysfunction of bladder, unspecified: Secondary | ICD-10-CM | POA: Diagnosis not present

## 2019-03-29 DIAGNOSIS — I10 Essential (primary) hypertension: Secondary | ICD-10-CM | POA: Diagnosis not present

## 2019-03-29 DIAGNOSIS — C720 Malignant neoplasm of spinal cord: Secondary | ICD-10-CM | POA: Diagnosis not present

## 2019-03-29 DIAGNOSIS — C50411 Malignant neoplasm of upper-outer quadrant of right female breast: Secondary | ICD-10-CM | POA: Diagnosis not present

## 2019-03-29 DIAGNOSIS — I2692 Saddle embolus of pulmonary artery without acute cor pulmonale: Secondary | ICD-10-CM | POA: Diagnosis not present

## 2019-03-29 DIAGNOSIS — Z510 Encounter for antineoplastic radiation therapy: Secondary | ICD-10-CM | POA: Diagnosis not present

## 2019-03-29 DIAGNOSIS — G620 Drug-induced polyneuropathy: Secondary | ICD-10-CM | POA: Diagnosis not present

## 2019-03-29 DIAGNOSIS — E785 Hyperlipidemia, unspecified: Secondary | ICD-10-CM | POA: Diagnosis not present

## 2019-03-29 DIAGNOSIS — G834 Cauda equina syndrome: Secondary | ICD-10-CM | POA: Diagnosis not present

## 2019-03-29 DIAGNOSIS — D649 Anemia, unspecified: Secondary | ICD-10-CM | POA: Diagnosis not present

## 2019-04-01 DIAGNOSIS — Z466 Encounter for fitting and adjustment of urinary device: Secondary | ICD-10-CM | POA: Diagnosis not present

## 2019-04-01 DIAGNOSIS — C7949 Secondary malignant neoplasm of other parts of nervous system: Secondary | ICD-10-CM | POA: Diagnosis not present

## 2019-04-01 DIAGNOSIS — I2692 Saddle embolus of pulmonary artery without acute cor pulmonale: Secondary | ICD-10-CM | POA: Diagnosis not present

## 2019-04-01 DIAGNOSIS — N390 Urinary tract infection, site not specified: Secondary | ICD-10-CM | POA: Diagnosis not present

## 2019-04-01 DIAGNOSIS — G620 Drug-induced polyneuropathy: Secondary | ICD-10-CM | POA: Diagnosis not present

## 2019-04-01 DIAGNOSIS — C50411 Malignant neoplasm of upper-outer quadrant of right female breast: Secondary | ICD-10-CM | POA: Diagnosis not present

## 2019-04-01 DIAGNOSIS — D649 Anemia, unspecified: Secondary | ICD-10-CM | POA: Diagnosis not present

## 2019-04-01 DIAGNOSIS — Z7901 Long term (current) use of anticoagulants: Secondary | ICD-10-CM | POA: Diagnosis not present

## 2019-04-01 DIAGNOSIS — Z510 Encounter for antineoplastic radiation therapy: Secondary | ICD-10-CM | POA: Diagnosis not present

## 2019-04-01 DIAGNOSIS — I10 Essential (primary) hypertension: Secondary | ICD-10-CM | POA: Diagnosis not present

## 2019-04-01 DIAGNOSIS — G834 Cauda equina syndrome: Secondary | ICD-10-CM | POA: Diagnosis not present

## 2019-04-01 DIAGNOSIS — Z993 Dependence on wheelchair: Secondary | ICD-10-CM | POA: Diagnosis not present

## 2019-04-01 DIAGNOSIS — E785 Hyperlipidemia, unspecified: Secondary | ICD-10-CM | POA: Diagnosis not present

## 2019-04-01 DIAGNOSIS — C7940 Secondary malignant neoplasm of unspecified part of nervous system: Secondary | ICD-10-CM | POA: Diagnosis not present

## 2019-04-01 DIAGNOSIS — C50919 Malignant neoplasm of unspecified site of unspecified female breast: Secondary | ICD-10-CM | POA: Diagnosis not present

## 2019-04-01 DIAGNOSIS — T451X5S Adverse effect of antineoplastic and immunosuppressive drugs, sequela: Secondary | ICD-10-CM | POA: Diagnosis not present

## 2019-04-02 DIAGNOSIS — T451X5S Adverse effect of antineoplastic and immunosuppressive drugs, sequela: Secondary | ICD-10-CM | POA: Diagnosis not present

## 2019-04-02 DIAGNOSIS — D649 Anemia, unspecified: Secondary | ICD-10-CM | POA: Diagnosis not present

## 2019-04-02 DIAGNOSIS — N319 Neuromuscular dysfunction of bladder, unspecified: Secondary | ICD-10-CM | POA: Diagnosis not present

## 2019-04-02 DIAGNOSIS — Z95828 Presence of other vascular implants and grafts: Secondary | ICD-10-CM | POA: Diagnosis not present

## 2019-04-02 DIAGNOSIS — Z993 Dependence on wheelchair: Secondary | ICD-10-CM | POA: Diagnosis not present

## 2019-04-02 DIAGNOSIS — C7949 Secondary malignant neoplasm of other parts of nervous system: Secondary | ICD-10-CM | POA: Diagnosis not present

## 2019-04-02 DIAGNOSIS — C50411 Malignant neoplasm of upper-outer quadrant of right female breast: Secondary | ICD-10-CM | POA: Diagnosis not present

## 2019-04-02 DIAGNOSIS — C50919 Malignant neoplasm of unspecified site of unspecified female breast: Secondary | ICD-10-CM | POA: Diagnosis not present

## 2019-04-02 DIAGNOSIS — C50911 Malignant neoplasm of unspecified site of right female breast: Secondary | ICD-10-CM | POA: Diagnosis not present

## 2019-04-02 DIAGNOSIS — G834 Cauda equina syndrome: Secondary | ICD-10-CM | POA: Diagnosis not present

## 2019-04-02 DIAGNOSIS — D72829 Elevated white blood cell count, unspecified: Secondary | ICD-10-CM | POA: Diagnosis not present

## 2019-04-02 DIAGNOSIS — C773 Secondary and unspecified malignant neoplasm of axilla and upper limb lymph nodes: Secondary | ICD-10-CM | POA: Diagnosis not present

## 2019-04-02 DIAGNOSIS — G893 Neoplasm related pain (acute) (chronic): Secondary | ICD-10-CM | POA: Diagnosis not present

## 2019-04-02 DIAGNOSIS — C7940 Secondary malignant neoplasm of unspecified part of nervous system: Secondary | ICD-10-CM | POA: Diagnosis not present

## 2019-04-02 DIAGNOSIS — Z171 Estrogen receptor negative status [ER-]: Secondary | ICD-10-CM | POA: Diagnosis not present

## 2019-04-02 DIAGNOSIS — T451X5A Adverse effect of antineoplastic and immunosuppressive drugs, initial encounter: Secondary | ICD-10-CM | POA: Diagnosis not present

## 2019-04-02 DIAGNOSIS — G620 Drug-induced polyneuropathy: Secondary | ICD-10-CM | POA: Diagnosis not present

## 2019-04-02 DIAGNOSIS — C720 Malignant neoplasm of spinal cord: Secondary | ICD-10-CM | POA: Diagnosis not present

## 2019-04-02 DIAGNOSIS — I10 Essential (primary) hypertension: Secondary | ICD-10-CM | POA: Diagnosis not present

## 2019-04-02 DIAGNOSIS — I2699 Other pulmonary embolism without acute cor pulmonale: Secondary | ICD-10-CM | POA: Diagnosis not present

## 2019-04-02 DIAGNOSIS — I2692 Saddle embolus of pulmonary artery without acute cor pulmonale: Secondary | ICD-10-CM | POA: Diagnosis not present

## 2019-04-02 DIAGNOSIS — Z510 Encounter for antineoplastic radiation therapy: Secondary | ICD-10-CM | POA: Diagnosis not present

## 2019-04-02 DIAGNOSIS — Z7901 Long term (current) use of anticoagulants: Secondary | ICD-10-CM | POA: Diagnosis not present

## 2019-04-02 DIAGNOSIS — E785 Hyperlipidemia, unspecified: Secondary | ICD-10-CM | POA: Diagnosis not present

## 2019-04-02 DIAGNOSIS — N390 Urinary tract infection, site not specified: Secondary | ICD-10-CM | POA: Diagnosis not present

## 2019-04-02 DIAGNOSIS — Z466 Encounter for fitting and adjustment of urinary device: Secondary | ICD-10-CM | POA: Diagnosis not present

## 2019-04-02 DIAGNOSIS — D473 Essential (hemorrhagic) thrombocythemia: Secondary | ICD-10-CM | POA: Diagnosis not present

## 2019-04-03 DIAGNOSIS — C50411 Malignant neoplasm of upper-outer quadrant of right female breast: Secondary | ICD-10-CM | POA: Diagnosis not present

## 2019-04-03 DIAGNOSIS — R922 Inconclusive mammogram: Secondary | ICD-10-CM | POA: Diagnosis not present

## 2019-04-03 DIAGNOSIS — C50911 Malignant neoplasm of unspecified site of right female breast: Secondary | ICD-10-CM | POA: Diagnosis not present

## 2019-04-03 DIAGNOSIS — C773 Secondary and unspecified malignant neoplasm of axilla and upper limb lymph nodes: Secondary | ICD-10-CM | POA: Diagnosis not present

## 2019-04-03 DIAGNOSIS — Z510 Encounter for antineoplastic radiation therapy: Secondary | ICD-10-CM | POA: Diagnosis not present

## 2019-04-03 DIAGNOSIS — C7940 Secondary malignant neoplasm of unspecified part of nervous system: Secondary | ICD-10-CM | POA: Diagnosis not present

## 2019-04-03 DIAGNOSIS — N6459 Other signs and symptoms in breast: Secondary | ICD-10-CM | POA: Diagnosis not present

## 2019-04-04 DIAGNOSIS — G834 Cauda equina syndrome: Secondary | ICD-10-CM | POA: Diagnosis not present

## 2019-04-04 DIAGNOSIS — C50411 Malignant neoplasm of upper-outer quadrant of right female breast: Secondary | ICD-10-CM | POA: Diagnosis not present

## 2019-04-04 DIAGNOSIS — I2692 Saddle embolus of pulmonary artery without acute cor pulmonale: Secondary | ICD-10-CM | POA: Diagnosis not present

## 2019-04-04 DIAGNOSIS — C7940 Secondary malignant neoplasm of unspecified part of nervous system: Secondary | ICD-10-CM | POA: Diagnosis not present

## 2019-04-04 DIAGNOSIS — C7949 Secondary malignant neoplasm of other parts of nervous system: Secondary | ICD-10-CM | POA: Diagnosis not present

## 2019-04-04 DIAGNOSIS — N390 Urinary tract infection, site not specified: Secondary | ICD-10-CM | POA: Diagnosis not present

## 2019-04-04 DIAGNOSIS — Z993 Dependence on wheelchair: Secondary | ICD-10-CM | POA: Diagnosis not present

## 2019-04-04 DIAGNOSIS — Z7901 Long term (current) use of anticoagulants: Secondary | ICD-10-CM | POA: Diagnosis not present

## 2019-04-04 DIAGNOSIS — E785 Hyperlipidemia, unspecified: Secondary | ICD-10-CM | POA: Diagnosis not present

## 2019-04-04 DIAGNOSIS — T451X5S Adverse effect of antineoplastic and immunosuppressive drugs, sequela: Secondary | ICD-10-CM | POA: Diagnosis not present

## 2019-04-04 DIAGNOSIS — C50919 Malignant neoplasm of unspecified site of unspecified female breast: Secondary | ICD-10-CM | POA: Diagnosis not present

## 2019-04-04 DIAGNOSIS — Z510 Encounter for antineoplastic radiation therapy: Secondary | ICD-10-CM | POA: Diagnosis not present

## 2019-04-04 DIAGNOSIS — Z466 Encounter for fitting and adjustment of urinary device: Secondary | ICD-10-CM | POA: Diagnosis not present

## 2019-04-04 DIAGNOSIS — D649 Anemia, unspecified: Secondary | ICD-10-CM | POA: Diagnosis not present

## 2019-04-04 DIAGNOSIS — G620 Drug-induced polyneuropathy: Secondary | ICD-10-CM | POA: Diagnosis not present

## 2019-04-04 DIAGNOSIS — I10 Essential (primary) hypertension: Secondary | ICD-10-CM | POA: Diagnosis not present

## 2019-04-05 DIAGNOSIS — Z510 Encounter for antineoplastic radiation therapy: Secondary | ICD-10-CM | POA: Diagnosis not present

## 2019-04-05 DIAGNOSIS — C7940 Secondary malignant neoplasm of unspecified part of nervous system: Secondary | ICD-10-CM | POA: Diagnosis not present

## 2019-04-05 DIAGNOSIS — C50411 Malignant neoplasm of upper-outer quadrant of right female breast: Secondary | ICD-10-CM | POA: Diagnosis not present

## 2019-04-08 DIAGNOSIS — C50411 Malignant neoplasm of upper-outer quadrant of right female breast: Secondary | ICD-10-CM | POA: Diagnosis not present

## 2019-04-08 DIAGNOSIS — Z510 Encounter for antineoplastic radiation therapy: Secondary | ICD-10-CM | POA: Diagnosis not present

## 2019-04-08 DIAGNOSIS — C7940 Secondary malignant neoplasm of unspecified part of nervous system: Secondary | ICD-10-CM | POA: Diagnosis not present

## 2019-04-09 DIAGNOSIS — I10 Essential (primary) hypertension: Secondary | ICD-10-CM | POA: Diagnosis not present

## 2019-04-09 DIAGNOSIS — N390 Urinary tract infection, site not specified: Secondary | ICD-10-CM | POA: Diagnosis not present

## 2019-04-09 DIAGNOSIS — D649 Anemia, unspecified: Secondary | ICD-10-CM | POA: Diagnosis not present

## 2019-04-09 DIAGNOSIS — C7949 Secondary malignant neoplasm of other parts of nervous system: Secondary | ICD-10-CM | POA: Diagnosis not present

## 2019-04-09 DIAGNOSIS — Z466 Encounter for fitting and adjustment of urinary device: Secondary | ICD-10-CM | POA: Diagnosis not present

## 2019-04-09 DIAGNOSIS — I2692 Saddle embolus of pulmonary artery without acute cor pulmonale: Secondary | ICD-10-CM | POA: Diagnosis not present

## 2019-04-09 DIAGNOSIS — C50919 Malignant neoplasm of unspecified site of unspecified female breast: Secondary | ICD-10-CM | POA: Diagnosis not present

## 2019-04-09 DIAGNOSIS — Z7901 Long term (current) use of anticoagulants: Secondary | ICD-10-CM | POA: Diagnosis not present

## 2019-04-09 DIAGNOSIS — T451X5S Adverse effect of antineoplastic and immunosuppressive drugs, sequela: Secondary | ICD-10-CM | POA: Diagnosis not present

## 2019-04-09 DIAGNOSIS — G620 Drug-induced polyneuropathy: Secondary | ICD-10-CM | POA: Diagnosis not present

## 2019-04-09 DIAGNOSIS — E785 Hyperlipidemia, unspecified: Secondary | ICD-10-CM | POA: Diagnosis not present

## 2019-04-09 DIAGNOSIS — Z993 Dependence on wheelchair: Secondary | ICD-10-CM | POA: Diagnosis not present

## 2019-04-09 DIAGNOSIS — G834 Cauda equina syndrome: Secondary | ICD-10-CM | POA: Diagnosis not present

## 2019-04-11 DIAGNOSIS — N390 Urinary tract infection, site not specified: Secondary | ICD-10-CM | POA: Diagnosis not present

## 2019-04-11 DIAGNOSIS — T451X5S Adverse effect of antineoplastic and immunosuppressive drugs, sequela: Secondary | ICD-10-CM | POA: Diagnosis not present

## 2019-04-11 DIAGNOSIS — C7949 Secondary malignant neoplasm of other parts of nervous system: Secondary | ICD-10-CM | POA: Diagnosis not present

## 2019-04-11 DIAGNOSIS — G620 Drug-induced polyneuropathy: Secondary | ICD-10-CM | POA: Diagnosis not present

## 2019-04-11 DIAGNOSIS — Z7901 Long term (current) use of anticoagulants: Secondary | ICD-10-CM | POA: Diagnosis not present

## 2019-04-11 DIAGNOSIS — M25511 Pain in right shoulder: Secondary | ICD-10-CM | POA: Diagnosis not present

## 2019-04-11 DIAGNOSIS — I2692 Saddle embolus of pulmonary artery without acute cor pulmonale: Secondary | ICD-10-CM | POA: Diagnosis not present

## 2019-04-11 DIAGNOSIS — Z993 Dependence on wheelchair: Secondary | ICD-10-CM | POA: Diagnosis not present

## 2019-04-11 DIAGNOSIS — G834 Cauda equina syndrome: Secondary | ICD-10-CM | POA: Diagnosis not present

## 2019-04-11 DIAGNOSIS — Z466 Encounter for fitting and adjustment of urinary device: Secondary | ICD-10-CM | POA: Diagnosis not present

## 2019-04-11 DIAGNOSIS — I10 Essential (primary) hypertension: Secondary | ICD-10-CM | POA: Diagnosis not present

## 2019-04-11 DIAGNOSIS — D649 Anemia, unspecified: Secondary | ICD-10-CM | POA: Diagnosis not present

## 2019-04-11 DIAGNOSIS — E785 Hyperlipidemia, unspecified: Secondary | ICD-10-CM | POA: Diagnosis not present

## 2019-04-11 DIAGNOSIS — C50919 Malignant neoplasm of unspecified site of unspecified female breast: Secondary | ICD-10-CM | POA: Diagnosis not present

## 2019-04-12 DIAGNOSIS — C50919 Malignant neoplasm of unspecified site of unspecified female breast: Secondary | ICD-10-CM | POA: Diagnosis not present

## 2019-04-12 DIAGNOSIS — D649 Anemia, unspecified: Secondary | ICD-10-CM | POA: Diagnosis not present

## 2019-04-12 DIAGNOSIS — Z466 Encounter for fitting and adjustment of urinary device: Secondary | ICD-10-CM | POA: Diagnosis not present

## 2019-04-12 DIAGNOSIS — T451X5S Adverse effect of antineoplastic and immunosuppressive drugs, sequela: Secondary | ICD-10-CM | POA: Diagnosis not present

## 2019-04-12 DIAGNOSIS — C7949 Secondary malignant neoplasm of other parts of nervous system: Secondary | ICD-10-CM | POA: Diagnosis not present

## 2019-04-12 DIAGNOSIS — I10 Essential (primary) hypertension: Secondary | ICD-10-CM | POA: Diagnosis not present

## 2019-04-12 DIAGNOSIS — G620 Drug-induced polyneuropathy: Secondary | ICD-10-CM | POA: Diagnosis not present

## 2019-04-12 DIAGNOSIS — Z993 Dependence on wheelchair: Secondary | ICD-10-CM | POA: Diagnosis not present

## 2019-04-12 DIAGNOSIS — N390 Urinary tract infection, site not specified: Secondary | ICD-10-CM | POA: Diagnosis not present

## 2019-04-12 DIAGNOSIS — E785 Hyperlipidemia, unspecified: Secondary | ICD-10-CM | POA: Diagnosis not present

## 2019-04-12 DIAGNOSIS — G834 Cauda equina syndrome: Secondary | ICD-10-CM | POA: Diagnosis not present

## 2019-04-12 DIAGNOSIS — Z7901 Long term (current) use of anticoagulants: Secondary | ICD-10-CM | POA: Diagnosis not present

## 2019-04-12 DIAGNOSIS — I2692 Saddle embolus of pulmonary artery without acute cor pulmonale: Secondary | ICD-10-CM | POA: Diagnosis not present

## 2019-04-15 DIAGNOSIS — I2692 Saddle embolus of pulmonary artery without acute cor pulmonale: Secondary | ICD-10-CM | POA: Diagnosis not present

## 2019-04-15 DIAGNOSIS — Z7901 Long term (current) use of anticoagulants: Secondary | ICD-10-CM | POA: Diagnosis not present

## 2019-04-15 DIAGNOSIS — C50919 Malignant neoplasm of unspecified site of unspecified female breast: Secondary | ICD-10-CM | POA: Diagnosis not present

## 2019-04-15 DIAGNOSIS — T451X5S Adverse effect of antineoplastic and immunosuppressive drugs, sequela: Secondary | ICD-10-CM | POA: Diagnosis not present

## 2019-04-15 DIAGNOSIS — Z466 Encounter for fitting and adjustment of urinary device: Secondary | ICD-10-CM | POA: Diagnosis not present

## 2019-04-15 DIAGNOSIS — C7949 Secondary malignant neoplasm of other parts of nervous system: Secondary | ICD-10-CM | POA: Diagnosis not present

## 2019-04-15 DIAGNOSIS — D649 Anemia, unspecified: Secondary | ICD-10-CM | POA: Diagnosis not present

## 2019-04-15 DIAGNOSIS — I10 Essential (primary) hypertension: Secondary | ICD-10-CM | POA: Diagnosis not present

## 2019-04-15 DIAGNOSIS — G620 Drug-induced polyneuropathy: Secondary | ICD-10-CM | POA: Diagnosis not present

## 2019-04-15 DIAGNOSIS — N390 Urinary tract infection, site not specified: Secondary | ICD-10-CM | POA: Diagnosis not present

## 2019-04-15 DIAGNOSIS — Z993 Dependence on wheelchair: Secondary | ICD-10-CM | POA: Diagnosis not present

## 2019-04-15 DIAGNOSIS — E785 Hyperlipidemia, unspecified: Secondary | ICD-10-CM | POA: Diagnosis not present

## 2019-04-15 DIAGNOSIS — G834 Cauda equina syndrome: Secondary | ICD-10-CM | POA: Diagnosis not present

## 2019-04-16 DIAGNOSIS — G620 Drug-induced polyneuropathy: Secondary | ICD-10-CM | POA: Diagnosis not present

## 2019-04-16 DIAGNOSIS — C50919 Malignant neoplasm of unspecified site of unspecified female breast: Secondary | ICD-10-CM | POA: Diagnosis not present

## 2019-04-16 DIAGNOSIS — C7949 Secondary malignant neoplasm of other parts of nervous system: Secondary | ICD-10-CM | POA: Diagnosis not present

## 2019-04-16 DIAGNOSIS — I2692 Saddle embolus of pulmonary artery without acute cor pulmonale: Secondary | ICD-10-CM | POA: Diagnosis not present

## 2019-04-16 DIAGNOSIS — D649 Anemia, unspecified: Secondary | ICD-10-CM | POA: Diagnosis not present

## 2019-04-16 DIAGNOSIS — I10 Essential (primary) hypertension: Secondary | ICD-10-CM | POA: Diagnosis not present

## 2019-04-16 DIAGNOSIS — T451X5S Adverse effect of antineoplastic and immunosuppressive drugs, sequela: Secondary | ICD-10-CM | POA: Diagnosis not present

## 2019-04-16 DIAGNOSIS — E785 Hyperlipidemia, unspecified: Secondary | ICD-10-CM | POA: Diagnosis not present

## 2019-04-16 DIAGNOSIS — Z993 Dependence on wheelchair: Secondary | ICD-10-CM | POA: Diagnosis not present

## 2019-04-16 DIAGNOSIS — R112 Nausea with vomiting, unspecified: Secondary | ICD-10-CM | POA: Diagnosis not present

## 2019-04-16 DIAGNOSIS — G834 Cauda equina syndrome: Secondary | ICD-10-CM | POA: Diagnosis not present

## 2019-04-16 DIAGNOSIS — Z7901 Long term (current) use of anticoagulants: Secondary | ICD-10-CM | POA: Diagnosis not present

## 2019-04-16 DIAGNOSIS — N390 Urinary tract infection, site not specified: Secondary | ICD-10-CM | POA: Diagnosis not present

## 2019-04-16 DIAGNOSIS — Z466 Encounter for fitting and adjustment of urinary device: Secondary | ICD-10-CM | POA: Diagnosis not present

## 2019-04-16 DIAGNOSIS — R109 Unspecified abdominal pain: Secondary | ICD-10-CM | POA: Diagnosis not present

## 2019-04-17 DIAGNOSIS — K6389 Other specified diseases of intestine: Secondary | ICD-10-CM | POA: Diagnosis not present

## 2019-04-17 DIAGNOSIS — T451X5A Adverse effect of antineoplastic and immunosuppressive drugs, initial encounter: Secondary | ICD-10-CM | POA: Diagnosis not present

## 2019-04-17 DIAGNOSIS — Z4682 Encounter for fitting and adjustment of non-vascular catheter: Secondary | ICD-10-CM | POA: Diagnosis not present

## 2019-04-17 DIAGNOSIS — G620 Drug-induced polyneuropathy: Secondary | ICD-10-CM | POA: Diagnosis not present

## 2019-04-17 DIAGNOSIS — Z853 Personal history of malignant neoplasm of breast: Secondary | ICD-10-CM | POA: Diagnosis not present

## 2019-04-17 DIAGNOSIS — I2699 Other pulmonary embolism without acute cor pulmonale: Secondary | ICD-10-CM | POA: Diagnosis not present

## 2019-04-17 DIAGNOSIS — R918 Other nonspecific abnormal finding of lung field: Secondary | ICD-10-CM | POA: Diagnosis not present

## 2019-04-17 DIAGNOSIS — N3000 Acute cystitis without hematuria: Secondary | ICD-10-CM | POA: Diagnosis not present

## 2019-04-17 DIAGNOSIS — G834 Cauda equina syndrome: Secondary | ICD-10-CM | POA: Diagnosis not present

## 2019-04-17 DIAGNOSIS — Z95828 Presence of other vascular implants and grafts: Secondary | ICD-10-CM | POA: Diagnosis not present

## 2019-04-17 DIAGNOSIS — R188 Other ascites: Secondary | ICD-10-CM | POA: Diagnosis not present

## 2019-04-17 DIAGNOSIS — B961 Klebsiella pneumoniae [K. pneumoniae] as the cause of diseases classified elsewhere: Secondary | ICD-10-CM | POA: Diagnosis not present

## 2019-04-17 DIAGNOSIS — R000 Tachycardia, unspecified: Secondary | ICD-10-CM | POA: Diagnosis not present

## 2019-04-17 DIAGNOSIS — K5730 Diverticulosis of large intestine without perforation or abscess without bleeding: Secondary | ICD-10-CM | POA: Diagnosis not present

## 2019-04-17 DIAGNOSIS — R600 Localized edema: Secondary | ICD-10-CM | POA: Diagnosis not present

## 2019-04-17 DIAGNOSIS — G8220 Paraplegia, unspecified: Secondary | ICD-10-CM | POA: Diagnosis not present

## 2019-04-17 DIAGNOSIS — Z7901 Long term (current) use of anticoagulants: Secondary | ICD-10-CM | POA: Diagnosis not present

## 2019-04-17 DIAGNOSIS — Z86711 Personal history of pulmonary embolism: Secondary | ICD-10-CM | POA: Diagnosis not present

## 2019-04-17 DIAGNOSIS — R112 Nausea with vomiting, unspecified: Secondary | ICD-10-CM | POA: Diagnosis not present

## 2019-04-17 DIAGNOSIS — R531 Weakness: Secondary | ICD-10-CM | POA: Diagnosis not present

## 2019-04-17 DIAGNOSIS — E860 Dehydration: Secondary | ICD-10-CM | POA: Diagnosis not present

## 2019-04-17 DIAGNOSIS — E872 Acidosis: Secondary | ICD-10-CM | POA: Diagnosis not present

## 2019-04-17 DIAGNOSIS — Z85848 Personal history of malignant neoplasm of other parts of nervous tissue: Secondary | ICD-10-CM | POA: Diagnosis not present

## 2019-04-17 DIAGNOSIS — B9620 Unspecified Escherichia coli [E. coli] as the cause of diseases classified elsewhere: Secondary | ICD-10-CM | POA: Diagnosis not present

## 2019-04-17 DIAGNOSIS — K56600 Partial intestinal obstruction, unspecified as to cause: Secondary | ICD-10-CM | POA: Diagnosis not present

## 2019-04-17 DIAGNOSIS — E876 Hypokalemia: Secondary | ICD-10-CM | POA: Diagnosis not present

## 2019-04-17 DIAGNOSIS — E871 Hypo-osmolality and hyponatremia: Secondary | ICD-10-CM | POA: Diagnosis not present

## 2019-04-25 DIAGNOSIS — D649 Anemia, unspecified: Secondary | ICD-10-CM | POA: Diagnosis not present

## 2019-04-25 DIAGNOSIS — T451X5S Adverse effect of antineoplastic and immunosuppressive drugs, sequela: Secondary | ICD-10-CM | POA: Diagnosis not present

## 2019-04-25 DIAGNOSIS — C7949 Secondary malignant neoplasm of other parts of nervous system: Secondary | ICD-10-CM | POA: Diagnosis not present

## 2019-04-25 DIAGNOSIS — I2692 Saddle embolus of pulmonary artery without acute cor pulmonale: Secondary | ICD-10-CM | POA: Diagnosis not present

## 2019-04-25 DIAGNOSIS — Z7901 Long term (current) use of anticoagulants: Secondary | ICD-10-CM | POA: Diagnosis not present

## 2019-04-25 DIAGNOSIS — C50919 Malignant neoplasm of unspecified site of unspecified female breast: Secondary | ICD-10-CM | POA: Diagnosis not present

## 2019-04-25 DIAGNOSIS — G7281 Critical illness myopathy: Secondary | ICD-10-CM | POA: Diagnosis not present

## 2019-04-25 DIAGNOSIS — Z466 Encounter for fitting and adjustment of urinary device: Secondary | ICD-10-CM | POA: Diagnosis not present

## 2019-04-25 DIAGNOSIS — E785 Hyperlipidemia, unspecified: Secondary | ICD-10-CM | POA: Diagnosis not present

## 2019-04-25 DIAGNOSIS — N390 Urinary tract infection, site not specified: Secondary | ICD-10-CM | POA: Diagnosis not present

## 2019-04-25 DIAGNOSIS — I10 Essential (primary) hypertension: Secondary | ICD-10-CM | POA: Diagnosis not present

## 2019-04-25 DIAGNOSIS — G620 Drug-induced polyneuropathy: Secondary | ICD-10-CM | POA: Diagnosis not present

## 2019-04-25 DIAGNOSIS — G834 Cauda equina syndrome: Secondary | ICD-10-CM | POA: Diagnosis not present

## 2019-04-25 DIAGNOSIS — Z993 Dependence on wheelchair: Secondary | ICD-10-CM | POA: Diagnosis not present

## 2019-04-30 DIAGNOSIS — C779 Secondary and unspecified malignant neoplasm of lymph node, unspecified: Secondary | ICD-10-CM | POA: Diagnosis not present

## 2019-04-30 DIAGNOSIS — G834 Cauda equina syndrome: Secondary | ICD-10-CM | POA: Diagnosis not present

## 2019-04-30 DIAGNOSIS — D497 Neoplasm of unspecified behavior of endocrine glands and other parts of nervous system: Secondary | ICD-10-CM | POA: Diagnosis not present

## 2019-04-30 DIAGNOSIS — N319 Neuromuscular dysfunction of bladder, unspecified: Secondary | ICD-10-CM | POA: Diagnosis not present

## 2019-04-30 DIAGNOSIS — C50919 Malignant neoplasm of unspecified site of unspecified female breast: Secondary | ICD-10-CM | POA: Diagnosis not present

## 2019-04-30 DIAGNOSIS — C720 Malignant neoplasm of spinal cord: Secondary | ICD-10-CM | POA: Diagnosis not present

## 2019-05-02 DIAGNOSIS — Z466 Encounter for fitting and adjustment of urinary device: Secondary | ICD-10-CM | POA: Diagnosis not present

## 2019-05-02 DIAGNOSIS — E785 Hyperlipidemia, unspecified: Secondary | ICD-10-CM | POA: Diagnosis not present

## 2019-05-02 DIAGNOSIS — T451X5S Adverse effect of antineoplastic and immunosuppressive drugs, sequela: Secondary | ICD-10-CM | POA: Diagnosis not present

## 2019-05-02 DIAGNOSIS — C50919 Malignant neoplasm of unspecified site of unspecified female breast: Secondary | ICD-10-CM | POA: Diagnosis not present

## 2019-05-02 DIAGNOSIS — D649 Anemia, unspecified: Secondary | ICD-10-CM | POA: Diagnosis not present

## 2019-05-02 DIAGNOSIS — I10 Essential (primary) hypertension: Secondary | ICD-10-CM | POA: Diagnosis not present

## 2019-05-02 DIAGNOSIS — C7949 Secondary malignant neoplasm of other parts of nervous system: Secondary | ICD-10-CM | POA: Diagnosis not present

## 2019-05-02 DIAGNOSIS — I2692 Saddle embolus of pulmonary artery without acute cor pulmonale: Secondary | ICD-10-CM | POA: Diagnosis not present

## 2019-05-02 DIAGNOSIS — G620 Drug-induced polyneuropathy: Secondary | ICD-10-CM | POA: Diagnosis not present

## 2019-05-02 DIAGNOSIS — N390 Urinary tract infection, site not specified: Secondary | ICD-10-CM | POA: Diagnosis not present

## 2019-05-02 DIAGNOSIS — Z7901 Long term (current) use of anticoagulants: Secondary | ICD-10-CM | POA: Diagnosis not present

## 2019-05-02 DIAGNOSIS — Z993 Dependence on wheelchair: Secondary | ICD-10-CM | POA: Diagnosis not present

## 2019-05-02 DIAGNOSIS — G834 Cauda equina syndrome: Secondary | ICD-10-CM | POA: Diagnosis not present

## 2019-05-04 DIAGNOSIS — T451X5S Adverse effect of antineoplastic and immunosuppressive drugs, sequela: Secondary | ICD-10-CM | POA: Diagnosis not present

## 2019-05-04 DIAGNOSIS — C50919 Malignant neoplasm of unspecified site of unspecified female breast: Secondary | ICD-10-CM | POA: Diagnosis not present

## 2019-05-04 DIAGNOSIS — Z466 Encounter for fitting and adjustment of urinary device: Secondary | ICD-10-CM | POA: Diagnosis not present

## 2019-05-04 DIAGNOSIS — E785 Hyperlipidemia, unspecified: Secondary | ICD-10-CM | POA: Diagnosis not present

## 2019-05-04 DIAGNOSIS — Z993 Dependence on wheelchair: Secondary | ICD-10-CM | POA: Diagnosis not present

## 2019-05-04 DIAGNOSIS — D649 Anemia, unspecified: Secondary | ICD-10-CM | POA: Diagnosis not present

## 2019-05-04 DIAGNOSIS — C7949 Secondary malignant neoplasm of other parts of nervous system: Secondary | ICD-10-CM | POA: Diagnosis not present

## 2019-05-04 DIAGNOSIS — N390 Urinary tract infection, site not specified: Secondary | ICD-10-CM | POA: Diagnosis not present

## 2019-05-04 DIAGNOSIS — I2692 Saddle embolus of pulmonary artery without acute cor pulmonale: Secondary | ICD-10-CM | POA: Diagnosis not present

## 2019-05-04 DIAGNOSIS — G834 Cauda equina syndrome: Secondary | ICD-10-CM | POA: Diagnosis not present

## 2019-05-04 DIAGNOSIS — I10 Essential (primary) hypertension: Secondary | ICD-10-CM | POA: Diagnosis not present

## 2019-05-04 DIAGNOSIS — G620 Drug-induced polyneuropathy: Secondary | ICD-10-CM | POA: Diagnosis not present

## 2019-05-04 DIAGNOSIS — Z7901 Long term (current) use of anticoagulants: Secondary | ICD-10-CM | POA: Diagnosis not present

## 2019-05-08 DIAGNOSIS — R339 Retention of urine, unspecified: Secondary | ICD-10-CM | POA: Diagnosis not present

## 2019-05-09 DIAGNOSIS — Z993 Dependence on wheelchair: Secondary | ICD-10-CM | POA: Diagnosis not present

## 2019-05-09 DIAGNOSIS — I2692 Saddle embolus of pulmonary artery without acute cor pulmonale: Secondary | ICD-10-CM | POA: Diagnosis not present

## 2019-05-09 DIAGNOSIS — T451X5S Adverse effect of antineoplastic and immunosuppressive drugs, sequela: Secondary | ICD-10-CM | POA: Diagnosis not present

## 2019-05-09 DIAGNOSIS — Z7901 Long term (current) use of anticoagulants: Secondary | ICD-10-CM | POA: Diagnosis not present

## 2019-05-09 DIAGNOSIS — D649 Anemia, unspecified: Secondary | ICD-10-CM | POA: Diagnosis not present

## 2019-05-09 DIAGNOSIS — Z466 Encounter for fitting and adjustment of urinary device: Secondary | ICD-10-CM | POA: Diagnosis not present

## 2019-05-09 DIAGNOSIS — G834 Cauda equina syndrome: Secondary | ICD-10-CM | POA: Diagnosis not present

## 2019-05-09 DIAGNOSIS — G620 Drug-induced polyneuropathy: Secondary | ICD-10-CM | POA: Diagnosis not present

## 2019-05-09 DIAGNOSIS — E785 Hyperlipidemia, unspecified: Secondary | ICD-10-CM | POA: Diagnosis not present

## 2019-05-09 DIAGNOSIS — C50919 Malignant neoplasm of unspecified site of unspecified female breast: Secondary | ICD-10-CM | POA: Diagnosis not present

## 2019-05-09 DIAGNOSIS — I10 Essential (primary) hypertension: Secondary | ICD-10-CM | POA: Diagnosis not present

## 2019-05-09 DIAGNOSIS — C7949 Secondary malignant neoplasm of other parts of nervous system: Secondary | ICD-10-CM | POA: Diagnosis not present

## 2019-05-09 DIAGNOSIS — R339 Retention of urine, unspecified: Secondary | ICD-10-CM | POA: Diagnosis not present

## 2019-05-09 DIAGNOSIS — N390 Urinary tract infection, site not specified: Secondary | ICD-10-CM | POA: Diagnosis not present

## 2019-05-14 DIAGNOSIS — G834 Cauda equina syndrome: Secondary | ICD-10-CM | POA: Diagnosis not present

## 2019-05-14 DIAGNOSIS — R109 Unspecified abdominal pain: Secondary | ICD-10-CM | POA: Diagnosis not present

## 2019-05-14 DIAGNOSIS — Z993 Dependence on wheelchair: Secondary | ICD-10-CM | POA: Diagnosis not present

## 2019-05-14 DIAGNOSIS — Z7901 Long term (current) use of anticoagulants: Secondary | ICD-10-CM | POA: Diagnosis not present

## 2019-05-14 DIAGNOSIS — R195 Other fecal abnormalities: Secondary | ICD-10-CM | POA: Diagnosis not present

## 2019-05-14 DIAGNOSIS — R300 Dysuria: Secondary | ICD-10-CM | POA: Diagnosis not present

## 2019-05-14 DIAGNOSIS — C7949 Secondary malignant neoplasm of other parts of nervous system: Secondary | ICD-10-CM | POA: Diagnosis not present

## 2019-05-14 DIAGNOSIS — N390 Urinary tract infection, site not specified: Secondary | ICD-10-CM | POA: Diagnosis not present

## 2019-05-14 DIAGNOSIS — Z466 Encounter for fitting and adjustment of urinary device: Secondary | ICD-10-CM | POA: Diagnosis not present

## 2019-05-14 DIAGNOSIS — I10 Essential (primary) hypertension: Secondary | ICD-10-CM | POA: Diagnosis not present

## 2019-05-14 DIAGNOSIS — E785 Hyperlipidemia, unspecified: Secondary | ICD-10-CM | POA: Diagnosis not present

## 2019-05-14 DIAGNOSIS — T451X5S Adverse effect of antineoplastic and immunosuppressive drugs, sequela: Secondary | ICD-10-CM | POA: Diagnosis not present

## 2019-05-14 DIAGNOSIS — G620 Drug-induced polyneuropathy: Secondary | ICD-10-CM | POA: Diagnosis not present

## 2019-05-14 DIAGNOSIS — I2692 Saddle embolus of pulmonary artery without acute cor pulmonale: Secondary | ICD-10-CM | POA: Diagnosis not present

## 2019-05-14 DIAGNOSIS — C50919 Malignant neoplasm of unspecified site of unspecified female breast: Secondary | ICD-10-CM | POA: Diagnosis not present

## 2019-05-14 DIAGNOSIS — D649 Anemia, unspecified: Secondary | ICD-10-CM | POA: Diagnosis not present

## 2019-05-15 DIAGNOSIS — T451X5S Adverse effect of antineoplastic and immunosuppressive drugs, sequela: Secondary | ICD-10-CM | POA: Diagnosis not present

## 2019-05-15 DIAGNOSIS — C7949 Secondary malignant neoplasm of other parts of nervous system: Secondary | ICD-10-CM | POA: Diagnosis not present

## 2019-05-15 DIAGNOSIS — Z7901 Long term (current) use of anticoagulants: Secondary | ICD-10-CM | POA: Diagnosis not present

## 2019-05-15 DIAGNOSIS — D649 Anemia, unspecified: Secondary | ICD-10-CM | POA: Diagnosis not present

## 2019-05-15 DIAGNOSIS — N390 Urinary tract infection, site not specified: Secondary | ICD-10-CM | POA: Diagnosis not present

## 2019-05-15 DIAGNOSIS — I2692 Saddle embolus of pulmonary artery without acute cor pulmonale: Secondary | ICD-10-CM | POA: Diagnosis not present

## 2019-05-15 DIAGNOSIS — G834 Cauda equina syndrome: Secondary | ICD-10-CM | POA: Diagnosis not present

## 2019-05-15 DIAGNOSIS — E785 Hyperlipidemia, unspecified: Secondary | ICD-10-CM | POA: Diagnosis not present

## 2019-05-15 DIAGNOSIS — Z993 Dependence on wheelchair: Secondary | ICD-10-CM | POA: Diagnosis not present

## 2019-05-15 DIAGNOSIS — Z466 Encounter for fitting and adjustment of urinary device: Secondary | ICD-10-CM | POA: Diagnosis not present

## 2019-05-15 DIAGNOSIS — I10 Essential (primary) hypertension: Secondary | ICD-10-CM | POA: Diagnosis not present

## 2019-05-15 DIAGNOSIS — C50919 Malignant neoplasm of unspecified site of unspecified female breast: Secondary | ICD-10-CM | POA: Diagnosis not present

## 2019-05-15 DIAGNOSIS — K921 Melena: Secondary | ICD-10-CM | POA: Diagnosis not present

## 2019-05-15 DIAGNOSIS — G620 Drug-induced polyneuropathy: Secondary | ICD-10-CM | POA: Diagnosis not present

## 2019-05-16 DIAGNOSIS — Z7901 Long term (current) use of anticoagulants: Secondary | ICD-10-CM | POA: Diagnosis not present

## 2019-05-16 DIAGNOSIS — T451X5S Adverse effect of antineoplastic and immunosuppressive drugs, sequela: Secondary | ICD-10-CM | POA: Diagnosis not present

## 2019-05-16 DIAGNOSIS — I2692 Saddle embolus of pulmonary artery without acute cor pulmonale: Secondary | ICD-10-CM | POA: Diagnosis not present

## 2019-05-16 DIAGNOSIS — Z466 Encounter for fitting and adjustment of urinary device: Secondary | ICD-10-CM | POA: Diagnosis not present

## 2019-05-16 DIAGNOSIS — N390 Urinary tract infection, site not specified: Secondary | ICD-10-CM | POA: Diagnosis not present

## 2019-05-16 DIAGNOSIS — D649 Anemia, unspecified: Secondary | ICD-10-CM | POA: Diagnosis not present

## 2019-05-16 DIAGNOSIS — Z993 Dependence on wheelchair: Secondary | ICD-10-CM | POA: Diagnosis not present

## 2019-05-16 DIAGNOSIS — I10 Essential (primary) hypertension: Secondary | ICD-10-CM | POA: Diagnosis not present

## 2019-05-16 DIAGNOSIS — G834 Cauda equina syndrome: Secondary | ICD-10-CM | POA: Diagnosis not present

## 2019-05-16 DIAGNOSIS — G620 Drug-induced polyneuropathy: Secondary | ICD-10-CM | POA: Diagnosis not present

## 2019-05-16 DIAGNOSIS — E785 Hyperlipidemia, unspecified: Secondary | ICD-10-CM | POA: Diagnosis not present

## 2019-05-16 DIAGNOSIS — C7949 Secondary malignant neoplasm of other parts of nervous system: Secondary | ICD-10-CM | POA: Diagnosis not present

## 2019-05-16 DIAGNOSIS — C50919 Malignant neoplasm of unspecified site of unspecified female breast: Secondary | ICD-10-CM | POA: Diagnosis not present

## 2019-05-17 DIAGNOSIS — J984 Other disorders of lung: Secondary | ICD-10-CM | POA: Diagnosis not present

## 2019-05-17 DIAGNOSIS — K8689 Other specified diseases of pancreas: Secondary | ICD-10-CM | POA: Diagnosis not present

## 2019-05-17 DIAGNOSIS — N281 Cyst of kidney, acquired: Secondary | ICD-10-CM | POA: Diagnosis not present

## 2019-05-17 DIAGNOSIS — C50919 Malignant neoplasm of unspecified site of unspecified female breast: Secondary | ICD-10-CM | POA: Diagnosis not present

## 2019-05-17 DIAGNOSIS — K5730 Diverticulosis of large intestine without perforation or abscess without bleeding: Secondary | ICD-10-CM | POA: Diagnosis not present

## 2019-05-17 DIAGNOSIS — K7689 Other specified diseases of liver: Secondary | ICD-10-CM | POA: Diagnosis not present

## 2019-05-21 DIAGNOSIS — D649 Anemia, unspecified: Secondary | ICD-10-CM | POA: Diagnosis not present

## 2019-05-21 DIAGNOSIS — C7949 Secondary malignant neoplasm of other parts of nervous system: Secondary | ICD-10-CM | POA: Diagnosis not present

## 2019-05-21 DIAGNOSIS — Z993 Dependence on wheelchair: Secondary | ICD-10-CM | POA: Diagnosis not present

## 2019-05-21 DIAGNOSIS — G834 Cauda equina syndrome: Secondary | ICD-10-CM | POA: Diagnosis not present

## 2019-05-21 DIAGNOSIS — E785 Hyperlipidemia, unspecified: Secondary | ICD-10-CM | POA: Diagnosis not present

## 2019-05-21 DIAGNOSIS — I10 Essential (primary) hypertension: Secondary | ICD-10-CM | POA: Diagnosis not present

## 2019-05-21 DIAGNOSIS — Z7901 Long term (current) use of anticoagulants: Secondary | ICD-10-CM | POA: Diagnosis not present

## 2019-05-21 DIAGNOSIS — S46911A Strain of unspecified muscle, fascia and tendon at shoulder and upper arm level, right arm, initial encounter: Secondary | ICD-10-CM | POA: Diagnosis not present

## 2019-05-21 DIAGNOSIS — G620 Drug-induced polyneuropathy: Secondary | ICD-10-CM | POA: Diagnosis not present

## 2019-05-21 DIAGNOSIS — Z466 Encounter for fitting and adjustment of urinary device: Secondary | ICD-10-CM | POA: Diagnosis not present

## 2019-05-21 DIAGNOSIS — T451X5S Adverse effect of antineoplastic and immunosuppressive drugs, sequela: Secondary | ICD-10-CM | POA: Diagnosis not present

## 2019-05-21 DIAGNOSIS — C50919 Malignant neoplasm of unspecified site of unspecified female breast: Secondary | ICD-10-CM | POA: Diagnosis not present

## 2019-05-21 DIAGNOSIS — I2692 Saddle embolus of pulmonary artery without acute cor pulmonale: Secondary | ICD-10-CM | POA: Diagnosis not present

## 2019-05-21 DIAGNOSIS — N390 Urinary tract infection, site not specified: Secondary | ICD-10-CM | POA: Diagnosis not present

## 2019-05-22 DIAGNOSIS — I10 Essential (primary) hypertension: Secondary | ICD-10-CM | POA: Diagnosis not present

## 2019-05-22 DIAGNOSIS — Z993 Dependence on wheelchair: Secondary | ICD-10-CM | POA: Diagnosis not present

## 2019-05-22 DIAGNOSIS — Z466 Encounter for fitting and adjustment of urinary device: Secondary | ICD-10-CM | POA: Diagnosis not present

## 2019-05-22 DIAGNOSIS — E785 Hyperlipidemia, unspecified: Secondary | ICD-10-CM | POA: Diagnosis not present

## 2019-05-22 DIAGNOSIS — I2692 Saddle embolus of pulmonary artery without acute cor pulmonale: Secondary | ICD-10-CM | POA: Diagnosis not present

## 2019-05-22 DIAGNOSIS — R339 Retention of urine, unspecified: Secondary | ICD-10-CM | POA: Diagnosis not present

## 2019-05-22 DIAGNOSIS — G834 Cauda equina syndrome: Secondary | ICD-10-CM | POA: Diagnosis not present

## 2019-05-22 DIAGNOSIS — Z7901 Long term (current) use of anticoagulants: Secondary | ICD-10-CM | POA: Diagnosis not present

## 2019-05-22 DIAGNOSIS — G620 Drug-induced polyneuropathy: Secondary | ICD-10-CM | POA: Diagnosis not present

## 2019-05-22 DIAGNOSIS — T451X5S Adverse effect of antineoplastic and immunosuppressive drugs, sequela: Secondary | ICD-10-CM | POA: Diagnosis not present

## 2019-05-22 DIAGNOSIS — D649 Anemia, unspecified: Secondary | ICD-10-CM | POA: Diagnosis not present

## 2019-05-22 DIAGNOSIS — C7949 Secondary malignant neoplasm of other parts of nervous system: Secondary | ICD-10-CM | POA: Diagnosis not present

## 2019-05-22 DIAGNOSIS — C50919 Malignant neoplasm of unspecified site of unspecified female breast: Secondary | ICD-10-CM | POA: Diagnosis not present

## 2019-05-22 DIAGNOSIS — N390 Urinary tract infection, site not specified: Secondary | ICD-10-CM | POA: Diagnosis not present

## 2019-05-24 DIAGNOSIS — G893 Neoplasm related pain (acute) (chronic): Secondary | ICD-10-CM | POA: Diagnosis not present

## 2019-05-24 DIAGNOSIS — D473 Essential (hemorrhagic) thrombocythemia: Secondary | ICD-10-CM | POA: Diagnosis not present

## 2019-05-24 DIAGNOSIS — Z923 Personal history of irradiation: Secondary | ICD-10-CM | POA: Diagnosis not present

## 2019-05-24 DIAGNOSIS — T451X5A Adverse effect of antineoplastic and immunosuppressive drugs, initial encounter: Secondary | ICD-10-CM | POA: Diagnosis not present

## 2019-05-24 DIAGNOSIS — I10 Essential (primary) hypertension: Secondary | ICD-10-CM | POA: Diagnosis not present

## 2019-05-24 DIAGNOSIS — Z7901 Long term (current) use of anticoagulants: Secondary | ICD-10-CM | POA: Diagnosis not present

## 2019-05-24 DIAGNOSIS — C50911 Malignant neoplasm of unspecified site of right female breast: Secondary | ICD-10-CM | POA: Diagnosis not present

## 2019-05-24 DIAGNOSIS — C7949 Secondary malignant neoplasm of other parts of nervous system: Secondary | ICD-10-CM | POA: Diagnosis not present

## 2019-05-24 DIAGNOSIS — D649 Anemia, unspecified: Secondary | ICD-10-CM | POA: Diagnosis not present

## 2019-05-24 DIAGNOSIS — Z86711 Personal history of pulmonary embolism: Secondary | ICD-10-CM | POA: Diagnosis not present

## 2019-05-24 DIAGNOSIS — Z9221 Personal history of antineoplastic chemotherapy: Secondary | ICD-10-CM | POA: Diagnosis not present

## 2019-05-24 DIAGNOSIS — Z171 Estrogen receptor negative status [ER-]: Secondary | ICD-10-CM | POA: Diagnosis not present

## 2019-05-24 DIAGNOSIS — C50919 Malignant neoplasm of unspecified site of unspecified female breast: Secondary | ICD-10-CM | POA: Diagnosis not present

## 2019-05-24 DIAGNOSIS — D72829 Elevated white blood cell count, unspecified: Secondary | ICD-10-CM | POA: Diagnosis not present

## 2019-05-24 DIAGNOSIS — C773 Secondary and unspecified malignant neoplasm of axilla and upper limb lymph nodes: Secondary | ICD-10-CM | POA: Diagnosis not present

## 2019-05-24 DIAGNOSIS — Z95828 Presence of other vascular implants and grafts: Secondary | ICD-10-CM | POA: Diagnosis not present

## 2019-05-24 DIAGNOSIS — G620 Drug-induced polyneuropathy: Secondary | ICD-10-CM | POA: Diagnosis not present

## 2019-05-26 DIAGNOSIS — G7281 Critical illness myopathy: Secondary | ICD-10-CM | POA: Diagnosis not present

## 2019-05-28 DIAGNOSIS — T451X5S Adverse effect of antineoplastic and immunosuppressive drugs, sequela: Secondary | ICD-10-CM | POA: Diagnosis not present

## 2019-05-28 DIAGNOSIS — K592 Neurogenic bowel, not elsewhere classified: Secondary | ICD-10-CM | POA: Diagnosis not present

## 2019-05-28 DIAGNOSIS — C720 Malignant neoplasm of spinal cord: Secondary | ICD-10-CM | POA: Diagnosis not present

## 2019-05-28 DIAGNOSIS — I890 Lymphedema, not elsewhere classified: Secondary | ICD-10-CM | POA: Diagnosis not present

## 2019-05-28 DIAGNOSIS — I10 Essential (primary) hypertension: Secondary | ICD-10-CM | POA: Diagnosis not present

## 2019-05-28 DIAGNOSIS — C50911 Malignant neoplasm of unspecified site of right female breast: Secondary | ICD-10-CM | POA: Diagnosis not present

## 2019-05-28 DIAGNOSIS — G620 Drug-induced polyneuropathy: Secondary | ICD-10-CM | POA: Diagnosis not present

## 2019-05-28 DIAGNOSIS — G834 Cauda equina syndrome: Secondary | ICD-10-CM | POA: Diagnosis not present

## 2019-05-28 DIAGNOSIS — Z7901 Long term (current) use of anticoagulants: Secondary | ICD-10-CM | POA: Diagnosis not present

## 2019-05-28 DIAGNOSIS — Z993 Dependence on wheelchair: Secondary | ICD-10-CM | POA: Diagnosis not present

## 2019-05-28 DIAGNOSIS — I2692 Saddle embolus of pulmonary artery without acute cor pulmonale: Secondary | ICD-10-CM | POA: Diagnosis not present

## 2019-05-28 DIAGNOSIS — E785 Hyperlipidemia, unspecified: Secondary | ICD-10-CM | POA: Diagnosis not present

## 2019-05-28 DIAGNOSIS — D649 Anemia, unspecified: Secondary | ICD-10-CM | POA: Diagnosis not present

## 2019-05-29 DIAGNOSIS — K592 Neurogenic bowel, not elsewhere classified: Secondary | ICD-10-CM | POA: Diagnosis not present

## 2019-05-29 DIAGNOSIS — Z993 Dependence on wheelchair: Secondary | ICD-10-CM | POA: Diagnosis not present

## 2019-05-29 DIAGNOSIS — T451X5S Adverse effect of antineoplastic and immunosuppressive drugs, sequela: Secondary | ICD-10-CM | POA: Diagnosis not present

## 2019-05-29 DIAGNOSIS — C50911 Malignant neoplasm of unspecified site of right female breast: Secondary | ICD-10-CM | POA: Diagnosis not present

## 2019-05-29 DIAGNOSIS — G620 Drug-induced polyneuropathy: Secondary | ICD-10-CM | POA: Diagnosis not present

## 2019-05-29 DIAGNOSIS — I890 Lymphedema, not elsewhere classified: Secondary | ICD-10-CM | POA: Diagnosis not present

## 2019-05-29 DIAGNOSIS — C50919 Malignant neoplasm of unspecified site of unspecified female breast: Secondary | ICD-10-CM | POA: Diagnosis not present

## 2019-05-29 DIAGNOSIS — D649 Anemia, unspecified: Secondary | ICD-10-CM | POA: Diagnosis not present

## 2019-05-29 DIAGNOSIS — I2692 Saddle embolus of pulmonary artery without acute cor pulmonale: Secondary | ICD-10-CM | POA: Diagnosis not present

## 2019-05-29 DIAGNOSIS — G834 Cauda equina syndrome: Secondary | ICD-10-CM | POA: Diagnosis not present

## 2019-05-29 DIAGNOSIS — C779 Secondary and unspecified malignant neoplasm of lymph node, unspecified: Secondary | ICD-10-CM | POA: Diagnosis not present

## 2019-05-29 DIAGNOSIS — C720 Malignant neoplasm of spinal cord: Secondary | ICD-10-CM | POA: Diagnosis not present

## 2019-05-29 DIAGNOSIS — Z7901 Long term (current) use of anticoagulants: Secondary | ICD-10-CM | POA: Diagnosis not present

## 2019-05-29 DIAGNOSIS — E785 Hyperlipidemia, unspecified: Secondary | ICD-10-CM | POA: Diagnosis not present

## 2019-05-29 DIAGNOSIS — I10 Essential (primary) hypertension: Secondary | ICD-10-CM | POA: Diagnosis not present

## 2019-05-29 DIAGNOSIS — D497 Neoplasm of unspecified behavior of endocrine glands and other parts of nervous system: Secondary | ICD-10-CM | POA: Diagnosis not present

## 2019-05-30 DIAGNOSIS — Z7901 Long term (current) use of anticoagulants: Secondary | ICD-10-CM | POA: Diagnosis not present

## 2019-05-30 DIAGNOSIS — R1033 Periumbilical pain: Secondary | ICD-10-CM | POA: Diagnosis not present

## 2019-05-30 DIAGNOSIS — R188 Other ascites: Secondary | ICD-10-CM | POA: Diagnosis not present

## 2019-05-30 DIAGNOSIS — I10 Essential (primary) hypertension: Secondary | ICD-10-CM | POA: Diagnosis not present

## 2019-05-30 DIAGNOSIS — Z853 Personal history of malignant neoplasm of breast: Secondary | ICD-10-CM | POA: Diagnosis not present

## 2019-05-30 DIAGNOSIS — R000 Tachycardia, unspecified: Secondary | ICD-10-CM | POA: Diagnosis not present

## 2019-05-30 DIAGNOSIS — K5730 Diverticulosis of large intestine without perforation or abscess without bleeding: Secondary | ICD-10-CM | POA: Diagnosis not present

## 2019-05-30 DIAGNOSIS — M545 Low back pain: Secondary | ICD-10-CM | POA: Diagnosis not present

## 2019-05-30 DIAGNOSIS — Z79899 Other long term (current) drug therapy: Secondary | ICD-10-CM | POA: Diagnosis not present

## 2019-05-30 DIAGNOSIS — J90 Pleural effusion, not elsewhere classified: Secondary | ICD-10-CM | POA: Diagnosis not present

## 2019-05-30 DIAGNOSIS — K6389 Other specified diseases of intestine: Secondary | ICD-10-CM | POA: Diagnosis not present

## 2019-05-30 DIAGNOSIS — K449 Diaphragmatic hernia without obstruction or gangrene: Secondary | ICD-10-CM | POA: Diagnosis not present

## 2019-05-30 DIAGNOSIS — R1030 Lower abdominal pain, unspecified: Secondary | ICD-10-CM | POA: Diagnosis not present

## 2019-05-30 DIAGNOSIS — D259 Leiomyoma of uterus, unspecified: Secondary | ICD-10-CM | POA: Diagnosis not present

## 2019-05-30 DIAGNOSIS — K5790 Diverticulosis of intestine, part unspecified, without perforation or abscess without bleeding: Secondary | ICD-10-CM | POA: Diagnosis not present

## 2019-06-04 DIAGNOSIS — E785 Hyperlipidemia, unspecified: Secondary | ICD-10-CM | POA: Diagnosis not present

## 2019-06-04 DIAGNOSIS — I890 Lymphedema, not elsewhere classified: Secondary | ICD-10-CM | POA: Diagnosis not present

## 2019-06-04 DIAGNOSIS — K592 Neurogenic bowel, not elsewhere classified: Secondary | ICD-10-CM | POA: Diagnosis not present

## 2019-06-04 DIAGNOSIS — Z7901 Long term (current) use of anticoagulants: Secondary | ICD-10-CM | POA: Diagnosis not present

## 2019-06-04 DIAGNOSIS — G834 Cauda equina syndrome: Secondary | ICD-10-CM | POA: Diagnosis not present

## 2019-06-04 DIAGNOSIS — D649 Anemia, unspecified: Secondary | ICD-10-CM | POA: Diagnosis not present

## 2019-06-04 DIAGNOSIS — C50911 Malignant neoplasm of unspecified site of right female breast: Secondary | ICD-10-CM | POA: Diagnosis not present

## 2019-06-04 DIAGNOSIS — C720 Malignant neoplasm of spinal cord: Secondary | ICD-10-CM | POA: Diagnosis not present

## 2019-06-04 DIAGNOSIS — I10 Essential (primary) hypertension: Secondary | ICD-10-CM | POA: Diagnosis not present

## 2019-06-04 DIAGNOSIS — Z993 Dependence on wheelchair: Secondary | ICD-10-CM | POA: Diagnosis not present

## 2019-06-04 DIAGNOSIS — G620 Drug-induced polyneuropathy: Secondary | ICD-10-CM | POA: Diagnosis not present

## 2019-06-04 DIAGNOSIS — T451X5S Adverse effect of antineoplastic and immunosuppressive drugs, sequela: Secondary | ICD-10-CM | POA: Diagnosis not present

## 2019-06-04 DIAGNOSIS — I2692 Saddle embolus of pulmonary artery without acute cor pulmonale: Secondary | ICD-10-CM | POA: Diagnosis not present

## 2019-06-05 DIAGNOSIS — G834 Cauda equina syndrome: Secondary | ICD-10-CM | POA: Diagnosis not present

## 2019-06-05 DIAGNOSIS — Z7901 Long term (current) use of anticoagulants: Secondary | ICD-10-CM | POA: Diagnosis not present

## 2019-06-05 DIAGNOSIS — G620 Drug-induced polyneuropathy: Secondary | ICD-10-CM | POA: Diagnosis not present

## 2019-06-05 DIAGNOSIS — C720 Malignant neoplasm of spinal cord: Secondary | ICD-10-CM | POA: Diagnosis not present

## 2019-06-05 DIAGNOSIS — Z993 Dependence on wheelchair: Secondary | ICD-10-CM | POA: Diagnosis not present

## 2019-06-05 DIAGNOSIS — D649 Anemia, unspecified: Secondary | ICD-10-CM | POA: Diagnosis not present

## 2019-06-05 DIAGNOSIS — T451X5S Adverse effect of antineoplastic and immunosuppressive drugs, sequela: Secondary | ICD-10-CM | POA: Diagnosis not present

## 2019-06-05 DIAGNOSIS — K592 Neurogenic bowel, not elsewhere classified: Secondary | ICD-10-CM | POA: Diagnosis not present

## 2019-06-05 DIAGNOSIS — E785 Hyperlipidemia, unspecified: Secondary | ICD-10-CM | POA: Diagnosis not present

## 2019-06-05 DIAGNOSIS — I2692 Saddle embolus of pulmonary artery without acute cor pulmonale: Secondary | ICD-10-CM | POA: Diagnosis not present

## 2019-06-05 DIAGNOSIS — C50911 Malignant neoplasm of unspecified site of right female breast: Secondary | ICD-10-CM | POA: Diagnosis not present

## 2019-06-05 DIAGNOSIS — I890 Lymphedema, not elsewhere classified: Secondary | ICD-10-CM | POA: Diagnosis not present

## 2019-06-05 DIAGNOSIS — I10 Essential (primary) hypertension: Secondary | ICD-10-CM | POA: Diagnosis not present

## 2019-06-07 DIAGNOSIS — R110 Nausea: Secondary | ICD-10-CM | POA: Diagnosis not present

## 2019-06-07 DIAGNOSIS — C7951 Secondary malignant neoplasm of bone: Secondary | ICD-10-CM | POA: Diagnosis not present

## 2019-06-07 DIAGNOSIS — Z515 Encounter for palliative care: Secondary | ICD-10-CM | POA: Diagnosis not present

## 2019-06-07 DIAGNOSIS — F4323 Adjustment disorder with mixed anxiety and depressed mood: Secondary | ICD-10-CM | POA: Diagnosis not present

## 2019-06-07 DIAGNOSIS — C50919 Malignant neoplasm of unspecified site of unspecified female breast: Secondary | ICD-10-CM | POA: Diagnosis not present

## 2019-06-07 DIAGNOSIS — G893 Neoplasm related pain (acute) (chronic): Secondary | ICD-10-CM | POA: Diagnosis not present

## 2019-06-07 DIAGNOSIS — R5381 Other malaise: Secondary | ICD-10-CM | POA: Diagnosis not present

## 2019-06-12 DIAGNOSIS — G834 Cauda equina syndrome: Secondary | ICD-10-CM | POA: Diagnosis not present

## 2019-06-12 DIAGNOSIS — Z993 Dependence on wheelchair: Secondary | ICD-10-CM | POA: Diagnosis not present

## 2019-06-12 DIAGNOSIS — T451X5S Adverse effect of antineoplastic and immunosuppressive drugs, sequela: Secondary | ICD-10-CM | POA: Diagnosis not present

## 2019-06-12 DIAGNOSIS — G620 Drug-induced polyneuropathy: Secondary | ICD-10-CM | POA: Diagnosis not present

## 2019-06-12 DIAGNOSIS — I10 Essential (primary) hypertension: Secondary | ICD-10-CM | POA: Diagnosis not present

## 2019-06-12 DIAGNOSIS — D649 Anemia, unspecified: Secondary | ICD-10-CM | POA: Diagnosis not present

## 2019-06-12 DIAGNOSIS — C50911 Malignant neoplasm of unspecified site of right female breast: Secondary | ICD-10-CM | POA: Diagnosis not present

## 2019-06-12 DIAGNOSIS — I2692 Saddle embolus of pulmonary artery without acute cor pulmonale: Secondary | ICD-10-CM | POA: Diagnosis not present

## 2019-06-12 DIAGNOSIS — C720 Malignant neoplasm of spinal cord: Secondary | ICD-10-CM | POA: Diagnosis not present

## 2019-06-12 DIAGNOSIS — I890 Lymphedema, not elsewhere classified: Secondary | ICD-10-CM | POA: Diagnosis not present

## 2019-06-12 DIAGNOSIS — Z7901 Long term (current) use of anticoagulants: Secondary | ICD-10-CM | POA: Diagnosis not present

## 2019-06-12 DIAGNOSIS — E785 Hyperlipidemia, unspecified: Secondary | ICD-10-CM | POA: Diagnosis not present

## 2019-06-12 DIAGNOSIS — K592 Neurogenic bowel, not elsewhere classified: Secondary | ICD-10-CM | POA: Diagnosis not present

## 2019-06-14 DIAGNOSIS — Z993 Dependence on wheelchair: Secondary | ICD-10-CM | POA: Diagnosis not present

## 2019-06-14 DIAGNOSIS — I890 Lymphedema, not elsewhere classified: Secondary | ICD-10-CM | POA: Diagnosis not present

## 2019-06-14 DIAGNOSIS — Z7901 Long term (current) use of anticoagulants: Secondary | ICD-10-CM | POA: Diagnosis not present

## 2019-06-14 DIAGNOSIS — I2692 Saddle embolus of pulmonary artery without acute cor pulmonale: Secondary | ICD-10-CM | POA: Diagnosis not present

## 2019-06-14 DIAGNOSIS — C50911 Malignant neoplasm of unspecified site of right female breast: Secondary | ICD-10-CM | POA: Diagnosis not present

## 2019-06-14 DIAGNOSIS — D649 Anemia, unspecified: Secondary | ICD-10-CM | POA: Diagnosis not present

## 2019-06-14 DIAGNOSIS — K592 Neurogenic bowel, not elsewhere classified: Secondary | ICD-10-CM | POA: Diagnosis not present

## 2019-06-14 DIAGNOSIS — C720 Malignant neoplasm of spinal cord: Secondary | ICD-10-CM | POA: Diagnosis not present

## 2019-06-14 DIAGNOSIS — E785 Hyperlipidemia, unspecified: Secondary | ICD-10-CM | POA: Diagnosis not present

## 2019-06-14 DIAGNOSIS — T451X5S Adverse effect of antineoplastic and immunosuppressive drugs, sequela: Secondary | ICD-10-CM | POA: Diagnosis not present

## 2019-06-14 DIAGNOSIS — G620 Drug-induced polyneuropathy: Secondary | ICD-10-CM | POA: Diagnosis not present

## 2019-06-14 DIAGNOSIS — G834 Cauda equina syndrome: Secondary | ICD-10-CM | POA: Diagnosis not present

## 2019-06-14 DIAGNOSIS — I10 Essential (primary) hypertension: Secondary | ICD-10-CM | POA: Diagnosis not present

## 2019-06-17 DIAGNOSIS — C50919 Malignant neoplasm of unspecified site of unspecified female breast: Secondary | ICD-10-CM | POA: Diagnosis not present

## 2019-06-17 DIAGNOSIS — R8290 Unspecified abnormal findings in urine: Secondary | ICD-10-CM | POA: Diagnosis not present

## 2019-06-19 DIAGNOSIS — G834 Cauda equina syndrome: Secondary | ICD-10-CM | POA: Diagnosis not present

## 2019-06-19 DIAGNOSIS — C720 Malignant neoplasm of spinal cord: Secondary | ICD-10-CM | POA: Diagnosis not present

## 2019-06-19 DIAGNOSIS — Z7901 Long term (current) use of anticoagulants: Secondary | ICD-10-CM | POA: Diagnosis not present

## 2019-06-19 DIAGNOSIS — T451X5S Adverse effect of antineoplastic and immunosuppressive drugs, sequela: Secondary | ICD-10-CM | POA: Diagnosis not present

## 2019-06-19 DIAGNOSIS — C50911 Malignant neoplasm of unspecified site of right female breast: Secondary | ICD-10-CM | POA: Diagnosis not present

## 2019-06-19 DIAGNOSIS — Z993 Dependence on wheelchair: Secondary | ICD-10-CM | POA: Diagnosis not present

## 2019-06-19 DIAGNOSIS — K592 Neurogenic bowel, not elsewhere classified: Secondary | ICD-10-CM | POA: Diagnosis not present

## 2019-06-19 DIAGNOSIS — D649 Anemia, unspecified: Secondary | ICD-10-CM | POA: Diagnosis not present

## 2019-06-19 DIAGNOSIS — E785 Hyperlipidemia, unspecified: Secondary | ICD-10-CM | POA: Diagnosis not present

## 2019-06-19 DIAGNOSIS — I890 Lymphedema, not elsewhere classified: Secondary | ICD-10-CM | POA: Diagnosis not present

## 2019-06-19 DIAGNOSIS — I2692 Saddle embolus of pulmonary artery without acute cor pulmonale: Secondary | ICD-10-CM | POA: Diagnosis not present

## 2019-06-19 DIAGNOSIS — I10 Essential (primary) hypertension: Secondary | ICD-10-CM | POA: Diagnosis not present

## 2019-06-19 DIAGNOSIS — G620 Drug-induced polyneuropathy: Secondary | ICD-10-CM | POA: Diagnosis not present

## 2019-06-20 ENCOUNTER — Emergency Department (HOSPITAL_BASED_OUTPATIENT_CLINIC_OR_DEPARTMENT_OTHER): Payer: BC Managed Care – PPO

## 2019-06-20 ENCOUNTER — Encounter (HOSPITAL_BASED_OUTPATIENT_CLINIC_OR_DEPARTMENT_OTHER): Payer: Self-pay | Admitting: Adult Health

## 2019-06-20 ENCOUNTER — Other Ambulatory Visit: Payer: Self-pay

## 2019-06-20 ENCOUNTER — Inpatient Hospital Stay (HOSPITAL_BASED_OUTPATIENT_CLINIC_OR_DEPARTMENT_OTHER)
Admission: EM | Admit: 2019-06-20 | Discharge: 2019-07-05 | DRG: 186 | Disposition: A | Discharge status: Home Health | Payer: BC Managed Care – PPO | Attending: Internal Medicine | Admitting: Internal Medicine

## 2019-06-20 DIAGNOSIS — N179 Acute kidney failure, unspecified: Secondary | ICD-10-CM | POA: Diagnosis not present

## 2019-06-20 DIAGNOSIS — Z171 Estrogen receptor negative status [ER-]: Secondary | ICD-10-CM

## 2019-06-20 DIAGNOSIS — R410 Disorientation, unspecified: Secondary | ICD-10-CM | POA: Diagnosis not present

## 2019-06-20 DIAGNOSIS — G7281 Critical illness myopathy: Secondary | ICD-10-CM | POA: Diagnosis not present

## 2019-06-20 DIAGNOSIS — R502 Drug induced fever: Secondary | ICD-10-CM | POA: Diagnosis not present

## 2019-06-20 DIAGNOSIS — E871 Hypo-osmolality and hyponatremia: Secondary | ICD-10-CM | POA: Diagnosis not present

## 2019-06-20 DIAGNOSIS — D649 Anemia, unspecified: Secondary | ICD-10-CM | POA: Diagnosis present

## 2019-06-20 DIAGNOSIS — K56609 Unspecified intestinal obstruction, unspecified as to partial versus complete obstruction: Secondary | ICD-10-CM | POA: Diagnosis present

## 2019-06-20 DIAGNOSIS — Z20828 Contact with and (suspected) exposure to other viral communicable diseases: Secondary | ICD-10-CM | POA: Diagnosis present

## 2019-06-20 DIAGNOSIS — R9431 Abnormal electrocardiogram [ECG] [EKG]: Secondary | ICD-10-CM | POA: Diagnosis not present

## 2019-06-20 DIAGNOSIS — R5383 Other fatigue: Secondary | ICD-10-CM | POA: Diagnosis not present

## 2019-06-20 DIAGNOSIS — R0600 Dyspnea, unspecified: Secondary | ICD-10-CM

## 2019-06-20 DIAGNOSIS — R110 Nausea: Secondary | ICD-10-CM | POA: Diagnosis not present

## 2019-06-20 DIAGNOSIS — T451X5A Adverse effect of antineoplastic and immunosuppressive drugs, initial encounter: Secondary | ICD-10-CM | POA: Diagnosis present

## 2019-06-20 DIAGNOSIS — T368X5A Adverse effect of other systemic antibiotics, initial encounter: Secondary | ICD-10-CM | POA: Diagnosis not present

## 2019-06-20 DIAGNOSIS — Z86711 Personal history of pulmonary embolism: Secondary | ICD-10-CM

## 2019-06-20 DIAGNOSIS — C784 Secondary malignant neoplasm of small intestine: Secondary | ICD-10-CM | POA: Diagnosis not present

## 2019-06-20 DIAGNOSIS — R6510 Systemic inflammatory response syndrome (SIRS) of non-infectious origin without acute organ dysfunction: Secondary | ICD-10-CM | POA: Diagnosis present

## 2019-06-20 DIAGNOSIS — C7931 Secondary malignant neoplasm of brain: Secondary | ICD-10-CM | POA: Diagnosis not present

## 2019-06-20 DIAGNOSIS — Z0189 Encounter for other specified special examinations: Secondary | ICD-10-CM

## 2019-06-20 DIAGNOSIS — Z9221 Personal history of antineoplastic chemotherapy: Secondary | ICD-10-CM | POA: Diagnosis not present

## 2019-06-20 DIAGNOSIS — C7970 Secondary malignant neoplasm of unspecified adrenal gland: Secondary | ICD-10-CM | POA: Diagnosis present

## 2019-06-20 DIAGNOSIS — Z7189 Other specified counseling: Secondary | ICD-10-CM | POA: Diagnosis not present

## 2019-06-20 DIAGNOSIS — Z6824 Body mass index (BMI) 24.0-24.9, adult: Secondary | ICD-10-CM

## 2019-06-20 DIAGNOSIS — K7689 Other specified diseases of liver: Secondary | ICD-10-CM | POA: Diagnosis not present

## 2019-06-20 DIAGNOSIS — A419 Sepsis, unspecified organism: Secondary | ICD-10-CM | POA: Diagnosis present

## 2019-06-20 DIAGNOSIS — I2699 Other pulmonary embolism without acute cor pulmonale: Secondary | ICD-10-CM | POA: Diagnosis present

## 2019-06-20 DIAGNOSIS — I10 Essential (primary) hypertension: Secondary | ICD-10-CM | POA: Diagnosis present

## 2019-06-20 DIAGNOSIS — E43 Unspecified severe protein-calorie malnutrition: Secondary | ICD-10-CM | POA: Diagnosis present

## 2019-06-20 DIAGNOSIS — N390 Urinary tract infection, site not specified: Secondary | ICD-10-CM | POA: Diagnosis present

## 2019-06-20 DIAGNOSIS — G9340 Encephalopathy, unspecified: Secondary | ICD-10-CM | POA: Diagnosis not present

## 2019-06-20 DIAGNOSIS — C786 Secondary malignant neoplasm of retroperitoneum and peritoneum: Secondary | ICD-10-CM | POA: Diagnosis not present

## 2019-06-20 DIAGNOSIS — R509 Fever, unspecified: Secondary | ICD-10-CM | POA: Diagnosis not present

## 2019-06-20 DIAGNOSIS — E876 Hypokalemia: Secondary | ICD-10-CM | POA: Diagnosis present

## 2019-06-20 DIAGNOSIS — G9341 Metabolic encephalopathy: Secondary | ICD-10-CM | POA: Diagnosis present

## 2019-06-20 DIAGNOSIS — Z23 Encounter for immunization: Secondary | ICD-10-CM

## 2019-06-20 DIAGNOSIS — R000 Tachycardia, unspecified: Secondary | ICD-10-CM | POA: Diagnosis present

## 2019-06-20 DIAGNOSIS — Z923 Personal history of irradiation: Secondary | ICD-10-CM

## 2019-06-20 DIAGNOSIS — C7949 Secondary malignant neoplasm of other parts of nervous system: Secondary | ICD-10-CM | POA: Diagnosis present

## 2019-06-20 DIAGNOSIS — C50919 Malignant neoplasm of unspecified site of unspecified female breast: Secondary | ICD-10-CM | POA: Diagnosis present

## 2019-06-20 DIAGNOSIS — R180 Malignant ascites: Secondary | ICD-10-CM | POA: Diagnosis not present

## 2019-06-20 DIAGNOSIS — Z9889 Other specified postprocedural states: Secondary | ICD-10-CM

## 2019-06-20 DIAGNOSIS — Z66 Do not resuscitate: Secondary | ICD-10-CM | POA: Diagnosis not present

## 2019-06-20 DIAGNOSIS — R0602 Shortness of breath: Secondary | ICD-10-CM | POA: Diagnosis not present

## 2019-06-20 DIAGNOSIS — D6481 Anemia due to antineoplastic chemotherapy: Secondary | ICD-10-CM | POA: Diagnosis present

## 2019-06-20 DIAGNOSIS — C7951 Secondary malignant neoplasm of bone: Secondary | ICD-10-CM | POA: Diagnosis present

## 2019-06-20 DIAGNOSIS — D849 Immunodeficiency, unspecified: Secondary | ICD-10-CM | POA: Diagnosis present

## 2019-06-20 DIAGNOSIS — E875 Hyperkalemia: Secondary | ICD-10-CM | POA: Diagnosis not present

## 2019-06-20 DIAGNOSIS — J90 Pleural effusion, not elsewhere classified: Secondary | ICD-10-CM | POA: Diagnosis not present

## 2019-06-20 DIAGNOSIS — I959 Hypotension, unspecified: Secondary | ICD-10-CM | POA: Diagnosis present

## 2019-06-20 DIAGNOSIS — F419 Anxiety disorder, unspecified: Secondary | ICD-10-CM | POA: Diagnosis present

## 2019-06-20 DIAGNOSIS — K5669 Other partial intestinal obstruction: Secondary | ICD-10-CM | POA: Diagnosis not present

## 2019-06-20 DIAGNOSIS — Z4659 Encounter for fitting and adjustment of other gastrointestinal appliance and device: Secondary | ICD-10-CM

## 2019-06-20 DIAGNOSIS — G834 Cauda equina syndrome: Secondary | ICD-10-CM | POA: Diagnosis present

## 2019-06-20 DIAGNOSIS — R4182 Altered mental status, unspecified: Secondary | ICD-10-CM | POA: Diagnosis not present

## 2019-06-20 DIAGNOSIS — Z7401 Bed confinement status: Secondary | ICD-10-CM

## 2019-06-20 DIAGNOSIS — Z515 Encounter for palliative care: Secondary | ICD-10-CM | POA: Diagnosis not present

## 2019-06-20 DIAGNOSIS — C801 Malignant (primary) neoplasm, unspecified: Secondary | ICD-10-CM | POA: Diagnosis not present

## 2019-06-20 DIAGNOSIS — B9620 Unspecified Escherichia coli [E. coli] as the cause of diseases classified elsewhere: Secondary | ICD-10-CM | POA: Diagnosis present

## 2019-06-20 DIAGNOSIS — D630 Anemia in neoplastic disease: Secondary | ICD-10-CM | POA: Diagnosis present

## 2019-06-20 DIAGNOSIS — D6959 Other secondary thrombocytopenia: Secondary | ICD-10-CM | POA: Diagnosis present

## 2019-06-20 DIAGNOSIS — R188 Other ascites: Secondary | ICD-10-CM | POA: Diagnosis not present

## 2019-06-20 DIAGNOSIS — L89152 Pressure ulcer of sacral region, stage 2: Secondary | ICD-10-CM | POA: Diagnosis present

## 2019-06-20 DIAGNOSIS — G0490 Encephalitis and encephalomyelitis, unspecified: Secondary | ICD-10-CM

## 2019-06-20 DIAGNOSIS — K5989 Other specified functional intestinal disorders: Secondary | ICD-10-CM | POA: Diagnosis not present

## 2019-06-20 DIAGNOSIS — G8220 Paraplegia, unspecified: Secondary | ICD-10-CM | POA: Diagnosis present

## 2019-06-20 DIAGNOSIS — Z4682 Encounter for fitting and adjustment of non-vascular catheter: Secondary | ICD-10-CM | POA: Diagnosis not present

## 2019-06-20 DIAGNOSIS — K562 Volvulus: Secondary | ICD-10-CM | POA: Diagnosis not present

## 2019-06-20 HISTORY — DX: Malignant (primary) neoplasm, unspecified: C80.1

## 2019-06-20 HISTORY — DX: Essential (primary) hypertension: I10

## 2019-06-20 LAB — COMPREHENSIVE METABOLIC PANEL
ALT: 24 U/L (ref 0–44)
AST: 20 U/L (ref 15–41)
Albumin: 2.6 g/dL — ABNORMAL LOW (ref 3.5–5.0)
Alkaline Phosphatase: 89 U/L (ref 38–126)
Anion gap: 14 (ref 5–15)
BUN: 21 mg/dL — ABNORMAL HIGH (ref 6–20)
CO2: 24 mmol/L (ref 22–32)
Calcium: 8.7 mg/dL — ABNORMAL LOW (ref 8.9–10.3)
Chloride: 89 mmol/L — ABNORMAL LOW (ref 98–111)
Creatinine, Ser: 1.82 mg/dL — ABNORMAL HIGH (ref 0.44–1.00)
GFR calc Af Amer: 36 mL/min — ABNORMAL LOW (ref >60)
GFR calc non Af Amer: 31 mL/min — ABNORMAL LOW (ref >60)
Glucose, Bld: 101 mg/dL — ABNORMAL HIGH (ref 70–99)
Potassium: 3.9 mmol/L (ref 3.5–5.1)
Sodium: 127 mmol/L — ABNORMAL LOW (ref 135–145)
Total Bilirubin: 0.6 mg/dL (ref 0.3–1.2)
Total Protein: 5.7 g/dL — ABNORMAL LOW (ref 6.5–8.1)

## 2019-06-20 LAB — URINALYSIS, MICROSCOPIC (REFLEX)

## 2019-06-20 LAB — CBC WITH DIFFERENTIAL/PLATELET
Abs Immature Granulocytes: 0.1 10*3/uL — ABNORMAL HIGH (ref 0.00–0.07)
Basophils Absolute: 0 10*3/uL (ref 0.0–0.1)
Basophils Relative: 0 %
Eosinophils Absolute: 0 10*3/uL (ref 0.0–0.5)
Eosinophils Relative: 0 %
HCT: 25.6 % — ABNORMAL LOW (ref 36.0–46.0)
Hemoglobin: 8.1 g/dL — ABNORMAL LOW (ref 12.0–15.0)
Immature Granulocytes: 1 %
Lymphocytes Relative: 9 %
Lymphs Abs: 0.7 10*3/uL (ref 0.7–4.0)
MCH: 27.5 pg (ref 26.0–34.0)
MCHC: 31.6 g/dL (ref 30.0–36.0)
MCV: 86.8 fL (ref 80.0–100.0)
Monocytes Absolute: 0.9 10*3/uL (ref 0.1–1.0)
Monocytes Relative: 12 %
Neutro Abs: 6 10*3/uL (ref 1.7–7.7)
Neutrophils Relative %: 78 %
Platelets: 456 10*3/uL — ABNORMAL HIGH (ref 150–400)
RBC: 2.95 MIL/uL — ABNORMAL LOW (ref 3.87–5.11)
RDW: 20 % — ABNORMAL HIGH (ref 11.5–15.5)
WBC: 7.7 10*3/uL (ref 4.0–10.5)
nRBC: 0.3 % — ABNORMAL HIGH (ref 0.0–0.2)

## 2019-06-20 LAB — URINALYSIS, ROUTINE W REFLEX MICROSCOPIC
Glucose, UA: NEGATIVE mg/dL
Hgb urine dipstick: NEGATIVE
Ketones, ur: NEGATIVE mg/dL
Leukocytes,Ua: NEGATIVE
Nitrite: NEGATIVE
Protein, ur: 30 mg/dL — AB
Specific Gravity, Urine: >1.03 — ABNORMAL HIGH (ref 1.005–1.030)
pH: 5 (ref 5.0–8.0)

## 2019-06-20 LAB — SARS CORONAVIRUS 2 BY RT PCR (HOSPITAL ORDER, PERFORMED IN ~~LOC~~ HOSPITAL LAB): SARS Coronavirus 2: NEGATIVE

## 2019-06-20 LAB — LACTIC ACID, PLASMA
Lactic Acid, Venous: 2.3 mmol/L (ref 0.5–1.9)
Lactic Acid, Venous: 2.4 mmol/L (ref 0.5–1.9)

## 2019-06-20 LAB — PROTIME-INR
INR: 2.7 — ABNORMAL HIGH (ref 0.8–1.2)
Prothrombin Time: 28.1 seconds — ABNORMAL HIGH (ref 11.4–15.2)

## 2019-06-20 LAB — PREGNANCY, URINE: Preg Test, Ur: NEGATIVE

## 2019-06-20 LAB — APTT: aPTT: 62 seconds — ABNORMAL HIGH (ref 24–36)

## 2019-06-20 MED ORDER — SODIUM CHLORIDE 0.9 % IV BOLUS (SEPSIS)
1000.0000 mL | Freq: Once | INTRAVENOUS | Status: AC
  Administered 2019-06-20: 21:00:00 1000 mL via INTRAVENOUS

## 2019-06-20 MED ORDER — VANCOMYCIN HCL IN DEXTROSE 1-5 GM/200ML-% IV SOLN: 1000.0000 mg | INTRAVENOUS | Status: DC

## 2019-06-20 MED ORDER — SODIUM CHLORIDE 0.9 % IV SOLN
2.0000 g | Freq: Two times a day (BID) | INTRAVENOUS | Status: DC
  Administered 2019-06-21 – 2019-06-22 (×3): 2 g via INTRAVENOUS
  Filled 2019-06-20 (×3): qty 2

## 2019-06-20 MED ORDER — VANCOMYCIN HCL IN DEXTROSE 1-5 GM/200ML-% IV SOLN
1000.0000 mg | Freq: Once | INTRAVENOUS | Status: AC
  Administered 2019-06-20: 1000 mg via INTRAVENOUS
  Filled 2019-06-20: qty 200

## 2019-06-20 MED ORDER — SODIUM CHLORIDE 0.9 % IV BOLUS (SEPSIS)
1000.0000 mL | Freq: Once | INTRAVENOUS | Status: AC
  Administered 2019-06-20: 20:00:00 1000 mL via INTRAVENOUS

## 2019-06-20 MED ORDER — METRONIDAZOLE IN NACL 5-0.79 MG/ML-% IV SOLN
500.0000 mg | Freq: Once | INTRAVENOUS | Status: AC
  Administered 2019-06-20: 500 mg via INTRAVENOUS
  Filled 2019-06-20: qty 100

## 2019-06-20 MED ORDER — ACETAMINOPHEN 325 MG PO TABS
ORAL_TABLET | ORAL | Status: AC
  Administered 2019-06-20: 20:00:00 650 mg
  Filled 2019-06-20: qty 2

## 2019-06-20 MED ORDER — SODIUM CHLORIDE 0.9 % IV SOLN
INTRAVENOUS | Status: DC
  Administered 2019-06-21: via INTRAVENOUS

## 2019-06-20 MED ORDER — SODIUM CHLORIDE 0.9 % IV SOLN
2.0000 g | Freq: Once | INTRAVENOUS | Status: AC
  Administered 2019-06-20: 21:00:00 2 g via INTRAVENOUS
  Filled 2019-06-20: qty 2

## 2019-06-20 NOTE — ED Triage Notes (Signed)
Metastatic Breast Cancer with mets to spine, abdomen and brain. Family reports that she had a UTI with E-col, she has not been treated. She is febrile and confused per family.

## 2019-06-20 NOTE — Care Management (Signed)
This is a no charge note  Transfer from [facility] per Dr. 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  Per EDP, pt is doing in and out catheterizations 3 times a day.  She recently was diagnosed with E. coli UTI.  Reportedly it was pansensitive and she has been on Cipro for the last 4 days.  Today she started having  fever and chills.  Patient is less reactive and more sleepy with confusion. No headache or neck pain per EDP. She has some abdominal distention when her husband says has been there due to some abdominal metastases.  Per her husband, it is not more distended than it normally is.  She has a little pain across her lower abdomen which she says is typical and unchanged from her baseline.  She denies any nausea or vomiting.  No diarrhea.  No wounds.  No cough or cold symptoms. Pt was found to have possible right basilar consolidation with small right pleural effusion on chest x-ray.  Urinalysis not impressive.  COVID-19 negative. Pt has sepsis due to possible HCAP.  Patient was initially hypotensive with blood pressure 86/75, which improved to 97/58 after giving 2 L normal saline bolus in the ED.  Patient was found to have WBC 7.7, negative pregnancy test, lactic acid 2.3, 2.4, INR 2.7, negative COVID-19 test, AKI with creatinine 1.82, BUN 21, hyponatremia with sodium 127, temperature 102.1, tachycardia, oxygen saturation 100%.  Patient is admitted to stepdown as inpatient.   Please call manager of [facility] at [phone] when pt arrives to floor   Ivor Costa, MD  [facility]   If 7PM-7AM, please contact night-coverage [email or web address] Password [email or web address] 06/20/2019, 11:27 PM

## 2019-06-20 NOTE — ED Provider Notes (Signed)
[facility] Provider Note   CSN: [id number] Arrival date & time: 06/20/19  1924     History   Chief Complaint Chief Complaint  Patient presents with  . Code Sepsis    HPI Kaylee Randolph is a 53 y.o. female.     Patient is a 53 year old female who presents with a fever.  She has a history of metastatic breast cancer.  She is getting treatment at the [facility] cancer center.  She has cauda equina related to her prior episode of breast cancer with spinal metastases.  She has to do in and out catheterizations 3 times a day.  She recently was diagnosed with E. coli UTI.  Reportedly it was pansensitive and she has been on Cipro for the last 4 days.  Today she started having a fever and chills.  She has been less active than she normally does and sleeping more often.  Her husband states that she has been confused today as well.  She denies any headache.  No neck pain.  She has some abdominal distention when her husband says has been there due to some abdominal metastases.  Per her husband, it is not more distended than it normally is.  She has a little pain across her lower abdomen which she says is typical and unchanged from her baseline.  She denies any nausea or vomiting.  No diarrhea.  No wounds.  No cough or cold symptoms.     Past Medical History:  Diagnosis Date  . Cancer ([place])   . Hypertension     Patient Active Problem List   Diagnosis Date Noted  . HCAP (healthcare-associated pneumonia) 06/20/2019    Past Surgical History:  Procedure Laterality Date  . BREAST SURGERY       OB History   No obstetric history on file.      Home Medications    Prior to Admission medications   Not on File    Family History History reviewed. No pertinent family history.  Social History Social History   Tobacco Use  . Smoking status: Never Smoker  Substance Use Topics  . Alcohol use: Not Currently  . Drug use: Never     Allergies   Patient  has no known allergies.   Review of Systems Review of Systems  Constitutional: Positive for chills, fatigue and fever. Negative for diaphoresis.  HENT: Negative for congestion, rhinorrhea and sneezing.   Eyes: Negative.   Respiratory: Negative for cough, chest tightness and shortness of breath.   Cardiovascular: Negative for chest pain and leg swelling.  Gastrointestinal: Positive for abdominal distention and abdominal pain. Negative for blood in stool, diarrhea, nausea and vomiting.  Genitourinary: Negative for difficulty urinating, flank pain, frequency and hematuria.  Musculoskeletal: Negative for arthralgias and back pain.  Skin: Negative for rash.  Neurological: Negative for dizziness, speech difficulty, weakness, numbness and headaches.     Physical Exam Updated Vital Signs BP (!) 119/99   Pulse (!) 114   Temp (!) 101.3 F (38.5 C)   Resp 16   Ht 5\' 4"  (1.626 m)   Wt 63.5 kg   SpO2 100%   BMI 24.03 kg/m   Physical Exam Constitutional:      Appearance: She is well-developed.  HENT:     Head: Normocephalic and atraumatic.  Eyes:     Pupils: Pupils are equal, round, and reactive to light.  Neck:     Musculoskeletal: Normal range of motion and neck supple.  Cardiovascular:  Rate and Rhythm: Normal rate and regular rhythm.     Heart sounds: Normal heart sounds.  Pulmonary:     Effort: Pulmonary effort is normal. No respiratory distress.     Breath sounds: Normal breath sounds. No wheezing or rales.  Chest:     Chest wall: No tenderness.  Abdominal:     General: Bowel sounds are normal. There is distension.     Palpations: Abdomen is soft.     Tenderness: There is no abdominal tenderness. There is no guarding or rebound.  Musculoskeletal: Normal range of motion.  Lymphadenopathy:     Cervical: No cervical adenopathy.  Skin:    General: Skin is warm and dry.     Findings: No rash.  Neurological:     Mental Status: She is alert.     Comments: Patient is  oriented to person and place.  She did know the year but did not know the month.  She has weakness to her lower extremities from the cauda equina.  She has full strength in her upper extremities.      ED Treatments / Results  Labs (all labs ordered are listed, but only abnormal results are displayed) Labs Reviewed  COMPREHENSIVE METABOLIC PANEL - Abnormal; Notable for the following components:      Result Value   Sodium 127 (*)    Chloride 89 (*)    Glucose, Bld 101 (*)    BUN 21 (*)    Creatinine, Ser 1.82 (*)    Calcium 8.7 (*)    Total Protein 5.7 (*)    Albumin 2.6 (*)    GFR calc non Af Amer 31 (*)    GFR calc Af Amer 36 (*)    All other components within normal limits  LACTIC ACID, PLASMA - Abnormal; Notable for the following components:   Lactic Acid, Venous 2.3 (*)    All other components within normal limits  LACTIC ACID, PLASMA - Abnormal; Notable for the following components:   Lactic Acid, Venous 2.4 (*)    All other components within normal limits  CBC WITH DIFFERENTIAL/PLATELET - Abnormal; Notable for the following components:   RBC 2.95 (*)    Hemoglobin 8.1 (*)    HCT 25.6 (*)    RDW 20.0 (*)    Platelets 456 (*)    nRBC 0.3 (*)    Abs Immature Granulocytes 0.10 (*)    All other components within normal limits  PROTIME-INR - Abnormal; Notable for the following components:   Prothrombin Time 28.1 (*)    INR 2.7 (*)    All other components within normal limits  URINALYSIS, ROUTINE W REFLEX MICROSCOPIC - Abnormal; Notable for the following components:   APPearance CLOUDY (*)    Specific Gravity, Urine >1.030 (*)    Bilirubin Urine SMALL (*)    Protein, ur 30 (*)    All other components within normal limits  APTT - Abnormal; Notable for the following components:   aPTT 62 (*)    All other components within normal limits  URINALYSIS, MICROSCOPIC (REFLEX) - Abnormal; Notable for the following components:   Bacteria, UA FEW (*)    All other components  within normal limits  SARS CORONAVIRUS 2 (HOSPITAL ORDER, [place] [facility])  CULTURE, BLOOD (ROUTINE X 2)  CULTURE, BLOOD (ROUTINE X 2)  URINE CULTURE  PREGNANCY, URINE    EKG EKG Interpretation  Date/Time:  Thursday June 20 2019 19:46:51 EDT Ventricular Rate:  137 PR Interval:  QRS Duration: 73 QT Interval:  319 QTC Calculation: 482 R Axis:   74 Text Interpretation:  Sinus tachycardia Borderline repolarization abnormality No old tracing to compare Confirmed by Malvin Johns [phone]) on 06/20/2019 8:58:27 PM   Radiology Dg Chest Port 1 View  Result Date: 06/20/2019 CLINICAL DATA:  53 year old female with history of metastatic breast cancer. Patient is febrile and confused. EXAM: PORTABLE CHEST 1 VIEW COMPARISON:  No priors. FINDINGS: Low lung volumes. Opacity at the right base which may reflect atelectasis and/or consolidation. Small to moderate right pleural effusion. Left lung is clear. No left pleural effusion. No evidence of pulmonary edema. Heart size is normal. Upper mediastinal contours are within normal limits. Right internal jugular single-lumen porta cath with tip terminating in the mid superior vena cava. IMPRESSION: 1. Atelectasis and/or consolidation in the right lung base with small to moderate right pleural effusion. Electronically Signed   By: Vinnie Langton M.D.   On: 06/20/2019 20:28    Procedures Procedures (including critical care time)  Medications Ordered in ED Medications  ceFEPIme (MAXIPIME) 2 g in sodium chloride 0.9 % 100 mL IVPB (has no administration in time range)  vancomycin (VANCOCIN) IVPB 1000 mg/200 mL premix (has no administration in time range)  0.9 %  sodium chloride infusion (has no administration in time range)  ceFEPIme (MAXIPIME) 2 g in sodium chloride 0.9 % 100 mL IVPB (0 g Intravenous Stopped 06/20/19 2134)  metroNIDAZOLE (FLAGYL) IVPB 500 mg (0 mg Intravenous Stopped 06/20/19 2249)  vancomycin (VANCOCIN) IVPB  1000 mg/200 mL premix (0 mg Intravenous Stopped 06/20/19 2143)  sodium chloride 0.9 % bolus 1,000 mL (0 mLs Intravenous Stopped 06/20/19 2143)    And  sodium chloride 0.9 % bolus 1,000 mL (0 mLs Intravenous Stopped 06/20/19 2249)  acetaminophen (TYLENOL) 325 MG tablet (650 mg  Given 06/20/19 2022)     Initial Impression / Assessment and Plan / ED Course  I have reviewed the triage vital signs and the nursing notes.  Pertinent labs & imaging results that were available during my care of the patient were reviewed by me and considered in my medical decision making (see chart for details).        Patient is a 53 year old female who presents with fever and hypotension.  She was treated aggressively for sepsis with IV fluids at a 30 cc/kg bolus and broad-spectrum antibiotics.  She was hypotensive on arrival with blood pressures in the [id number] systolic.  This has improved with IV fluids.  Her urinalysis does not show evidence of infection.  Her chest x-ray shows possible infiltrate.  She does not have any significant abdominal pain or increased ascites that would be more concerning for SBP.  She does not have headache, neck pain or other suggestions of meningitis.  I spoke with Dr. Blaine Hamper who will admit the patient for further treatment.  CRITICAL CARE Performed by: Malvin Johns Total critical care time: 60 minutes Critical care time was exclusive of separately billable procedures and treating other patients. Critical care was necessary to treat or prevent imminent or life-threatening deterioration. Critical care was time spent personally by me on the following activities: development of treatment plan with patient and/or surrogate as well as nursing, discussions with consultants, evaluation of patient's response to treatment, examination of patient, obtaining history from patient or surrogate, ordering and performing treatments and interventions, ordering and review of laboratory studies, ordering and review  of radiographic studies, pulse oximetry and re-evaluation of patient's condition.   Final Clinical Impressions(s) /  ED Diagnoses   Final diagnoses:  Sepsis [facility])   ED Discharge Orders    None       Malvin Johns, MD 06/20/19 2310

## 2019-06-20 NOTE — Progress Notes (Signed)
Patient has been weaned off HI-FLO cannula and SPO2 remains at 100%.

## 2019-06-20 NOTE — ED Notes (Signed)
Pt. Does not walk .... Pt. Uses a wheel chair for getting around.

## 2019-06-20 NOTE — Progress Notes (Signed)
Placed patient on 10 liter Hi-Flo Nasal cannula due to Patient's SPO2 being 74%.  Patient's SPO2 increased to 100% after 2 minutes.

## 2019-06-20 NOTE — Progress Notes (Signed)
Pharmacy Antibiotic Note  Kaylee Randolph is a 53 y.o. female admitted on 06/20/2019 with fever and confusion.  She was recently treated with Cipro for E.coli UTI.  Pharmacy has been consulted for vancomycin and cefepime dosing for sepsis.  First doses of antibiotics have been ordered.  SCr 1.82, CrCL 31 ml/min, Tmax 102.1, WBC WNL, LA 2.3.  Plan: Vanc 1gm IV Q36H for AUC 486 using SCr 1.82 Cefepime 2gm IV Q12H Monitor renal fxn, micro data to de-escalate, vanc AUC as indicated  Height: 5\' 4"  (162.6 cm) Weight: 140 lb (63.5 kg) IBW/kg (Calculated) : 54.7  Temp (24hrs), Avg:102.1 F (38.9 C), Min:102.1 F (38.9 C), Max:102.1 F (38.9 C)  Recent Labs  Lab 06/20/19 2000  WBC 7.7  CREATININE 1.82*  LATICACIDVEN 2.3*    Estimated Creatinine Clearance: 30.9 mL/min (A) (by C-G formula based on SCr of 1.82 mg/dL (H)).    No Known Allergies  Vanc 10/1 >> Cefepime 10/1 >>  10/1 BCx -  10/1 UCx -  10/1 covid -   Kaelen Caughlin D. Mina Marble, PharmD, BCPS, [place] 06/20/2019, 8:46 PM

## 2019-06-20 NOTE — ED Notes (Signed)
ED TO INPATIENT HANDOFF REPORT  ED Nurse Name and Phone #: Claiborne Billings [phone]  S Name/Age/Gender Kaylee Randolph 53 y.o. female Room/Bed: MH11/MH11  Code Status   Code Status: Not on file  Home/SNF/Other   Patient oriented to: self, place, time and situation Is this baseline? Yes   Triage Complete: Triage complete  Chief Complaint CA PT, FEVER, CONFUSED  Triage Note Metastatic Breast Cancer with mets to spine, abdomen and brain. Family reports that she had a UTI with E-col, she has not been treated. She is febrile and confused per family.    Allergies No Known Allergies  Level of Care/Admitting Diagnosis ED Disposition    None      B Medical/Surgery History Past Medical History:  Diagnosis Date  . Cancer ([place])   . Hypertension    Past Surgical History:  Procedure Laterality Date  . BREAST SURGERY       A IV Location/Drains/Wounds Patient Lines/Drains/Airways Status   Active Line/Drains/Airways    Name:   Placement date:   Placement time:   Site:   Days:   Implanted Port 06/20/19 Chest   06/20/19    2023    Chest   less than 1   Peripheral IV 06/20/19 Right Antecubital   06/20/19    2013    Antecubital   less than 1   Urethral Catheter Daine Gravel, RN  Temperature probe 14 Fr.   06/20/19    2100    Temperature probe   less than 1   Pressure Injury 06/20/19 Coccyx Mid Stage II -  Partial thickness loss of dermis presenting as a shallow open ulcer with a red, pink wound bed without slough.   06/20/19    2102     less than 1          Intake/Output Last 24 hours  Intake/Output Summary (Last 24 hours) at 06/20/2019 2243 Last data filed at 06/20/2019 2050 Gross per 24 hour  Intake 1000 ml  Output -  Net 1000 ml    Labs/Imaging Results for orders placed or performed during the hospital encounter of 06/20/19 (from the past 48 hour(s))  Comprehensive metabolic panel     Status: Abnormal   Collection Time: 06/20/19  8:00 PM  Result Value Ref Range    Sodium 127 (L) 135 - 145 mmol/L   Potassium 3.9 3.5 - 5.1 mmol/L   Chloride 89 (L) 98 - 111 mmol/L   CO2 24 22 - 32 mmol/L   Glucose, Bld 101 (H) 70 - 99 mg/dL   BUN 21 (H) 6 - 20 mg/dL   Creatinine, Ser 1.82 (H) 0.44 - 1.00 mg/dL   Calcium 8.7 (L) 8.9 - 10.3 mg/dL   Total Protein 5.7 (L) 6.5 - 8.1 g/dL   Albumin 2.6 (L) 3.5 - 5.0 g/dL   AST 20 15 - 41 U/L   ALT 24 0 - 44 U/L   Alkaline Phosphatase 89 38 - 126 U/L   Total Bilirubin 0.6 0.3 - 1.2 mg/dL   GFR calc non Af Amer 31 (L) >60 mL/min   GFR calc Af Amer 36 (L) >60 mL/min   Anion gap 14 5 - 15    Comment: Performed at [facility], [address] [place], [place] [address]  Lactic acid, plasma     Status: Abnormal   Collection Time: 06/20/19  8:00 PM  Result Value Ref Range   Lactic Acid, Venous 2.3 (HH) 0.5 - 1.9 mmol/L  Comment: CRITICAL RESULT CALLED TO, READ BACK BY AND VERIFIED WITH: MEREDITH WALTON RN @2021  06/20/2019 OLSONM Performed at [facility], [address] [place], [place] [address]   CBC with Differential     Status: Abnormal   Collection Time: 06/20/19  8:00 PM  Result Value Ref Range   WBC 7.7 4.0 - 10.5 K/uL   RBC 2.95 (L) 3.87 - 5.11 MIL/uL   Hemoglobin 8.1 (L) 12.0 - 15.0 g/dL   HCT 25.6 (L) 36.0 - 46.0 %   MCV 86.8 80.0 - 100.0 fL   MCH 27.5 26.0 - 34.0 pg   MCHC 31.6 30.0 - 36.0 g/dL   RDW 20.0 (H) 11.5 - 15.5 %   Platelets 456 (H) 150 - 400 K/uL   nRBC 0.3 (H) 0.0 - 0.2 %   Neutrophils Relative % 78 %   Neutro Abs 6.0 1.7 - 7.7 K/uL   Lymphocytes Relative 9 %   Lymphs Abs 0.7 0.7 - 4.0 K/uL   Monocytes Relative 12 %   Monocytes Absolute 0.9 0.1 - 1.0 K/uL   Eosinophils Relative 0 %   Eosinophils Absolute 0.0 0.0 - 0.5 K/uL   Basophils Relative 0 %   Basophils Absolute 0.0 0.0 - 0.1 K/uL   Immature Granulocytes 1 %   Abs Immature Granulocytes 0.10 (H) 0.00 - 0.07 K/uL    Comment: Performed at [facility] [address] [place], [place] [address]   Protime-INR     Status: Abnormal   Collection Time: 06/20/19  8:00 PM  Result Value Ref Range   Prothrombin Time 28.1 (H) 11.4 - 15.2 seconds   INR 2.7 (H) 0.8 - 1.2    Comment: (NOTE) INR goal varies based on device and disease states. Performed at [facility], [address] [place], [place] [address]   APTT     Status: Abnormal   Collection Time: 06/20/19  8:00 PM  Result Value Ref Range   aPTT 62 (H) 24 - 36 seconds    Comment:        IF BASELINE aPTT IS ELEVATED, SUGGEST PATIENT RISK ASSESSMENT BE USED TO DETERMINE APPROPRIATE ANTICOAGULANT THERAPY. Performed at [facility], [address] [place], [place] [address]   SARS Coronavirus 2 [facility] order, Performed in [facility] hospital lab) Nasopharyngeal Nasopharyngeal Swab     Status: None   Collection Time: 06/20/19  8:19 PM   Specimen: Nasopharyngeal Swab  Result Value Ref Range   SARS Coronavirus 2 NEGATIVE NEGATIVE    Comment: (NOTE) If result is NEGATIVE SARS-CoV-2 target nucleic acids are NOT DETECTED. The SARS-CoV-2 RNA is generally detectable in upper and lower  respiratory specimens during the acute phase of infection. The lowest  concentration of SARS-CoV-2 viral copies this assay can detect is 250  copies / mL. A negative result does not preclude SARS-CoV-2 infection  and should not be used as the sole basis for treatment or other  patient management decisions.  A negative result may occur with  improper specimen collection / handling, submission of specimen other  than nasopharyngeal swab, presence of viral mutation(s) within the  areas targeted by this assay, and inadequate number of viral copies  (<250 copies / mL). A negative result must be combined with clinical  observations, patient history, and epidemiological information. If result is POSITIVE SARS-CoV-2 target nucleic acids are DETECTED. The SARS-CoV-2 RNA is generally detectable in upper and lower  respiratory  specimens dur ing the acute phase of infection.  Positive  results are indicative of active infection with SARS-CoV-2.  Clinical  correlation with patient history and other diagnostic information is  necessary to determine patient infection status.  Positive results do  not rule out bacterial infection or co-infection with other viruses. If result is PRESUMPTIVE POSTIVE SARS-CoV-2 nucleic acids MAY BE PRESENT.   A presumptive positive result was obtained on the submitted specimen  and confirmed on repeat testing.  While 2019 novel coronavirus  (SARS-CoV-2) nucleic acids may be present in the submitted sample  additional confirmatory testing may be necessary for epidemiological  and / or clinical management purposes  to differentiate between  SARS-CoV-2 and other Sarbecovirus currently known to infect humans.  If clinically indicated additional testing with an alternate test  methodology [phone]) is advised. The SARS-CoV-2 RNA is generally  detectable in upper and lower respiratory sp ecimens during the acute  phase of infection. The expected result is Negative. Fact Sheet for Patients:  StrictlyIdeas.no Fact Sheet for Healthcare Providers: BankingDealers.co.za This test is not yet approved or cleared by the [place] FDA and has been authorized for detection and/or diagnosis of SARS-CoV-2 by FDA under an Emergency Use Authorization (EUA).  This EUA will remain in effect (meaning this test can be used) for the duration of the COVID-19 declaration under Section 564(b)(1) of the Act, 21 U.S.C. section 360bbb-3(b)(1), unless the authorization is terminated or revoked sooner. Performed at [facility], [address] [place], [place] [address]   Urinalysis, Routine w reflex microscopic     Status: Abnormal   Collection Time: 06/20/19  8:32 PM  Result Value Ref Range   Color, Urine YELLOW YELLOW   APPearance CLOUDY (A)  CLEAR   Specific Gravity, Urine >1.030 (H) 1.005 - 1.030   pH 5.0 5.0 - 8.0   Glucose, UA NEGATIVE NEGATIVE mg/dL   Hgb urine dipstick NEGATIVE NEGATIVE   Bilirubin Urine SMALL (A) NEGATIVE   Ketones, ur NEGATIVE NEGATIVE mg/dL   Protein, ur 30 (A) NEGATIVE mg/dL   Nitrite NEGATIVE NEGATIVE   Leukocytes,Ua NEGATIVE NEGATIVE    Comment: Performed at [facility], [address] [place], [place] [address]  Pregnancy, urine     Status: None   Collection Time: 06/20/19  8:32 PM  Result Value Ref Range   Preg Test, Ur NEGATIVE NEGATIVE    Comment:        THE SENSITIVITY OF THIS METHODOLOGY IS >20 mIU/mL. Performed at [facility], [address] [place], [place] [address]   Urinalysis, Microscopic (reflex)     Status: Abnormal   Collection Time: 06/20/19  8:32 PM  Result Value Ref Range   RBC / HPF 0-5 0 - 5 RBC/hpf   WBC, UA 0-5 0 - 5 WBC/hpf   Bacteria, UA FEW (A) NONE SEEN   Squamous Epithelial / LPF 0-5 0 - 5   Mucus PRESENT    Hyaline Casts, UA PRESENT    Amorphous Crystal PRESENT     Comment: Performed at [facility], [address] [place], [place] [address]  Lactic acid, plasma     Status: Abnormal   Collection Time: 06/20/19  9:42 PM  Result Value Ref Range   Lactic Acid, Venous 2.4 (HH) 0.5 - 1.9 mmol/L    Comment: CRITICAL RESULT CALLED TO, READ BACK BY AND VERIFIED WITH: WALTON,M,RN @ 2206 06/20/19 BY GWYN,P Performed at [facility], [address] [place], [place] [address]    Dg Chest [facility] 1 View  Result Date: 06/20/2019 CLINICAL DATA:  53 year old female with history of metastatic breast cancer. Patient is febrile and confused. EXAM: PORTABLE CHEST 1 VIEW COMPARISON:  No priors. FINDINGS: Low lung volumes. Opacity at the right base which may reflect atelectasis and/or consolidation. Small to moderate right pleural effusion. Left lung is clear. No left pleural effusion. No evidence of pulmonary edema. Heart size is  normal. Upper mediastinal contours are within normal limits. Right internal jugular single-lumen porta cath with tip terminating in the mid superior vena cava. IMPRESSION: 1. Atelectasis and/or consolidation in the right lung base with small to moderate right pleural effusion. Electronically Signed   By: Vinnie Langton M.D.   On: 06/20/2019 20:28    Pending Labs Unresulted Labs (From admission, onward)    Start     Ordered   06/20/19 2013  Urine culture  ONCE - STAT,   STAT     06/20/19 2016   06/20/19 1957  Culture, blood (Routine x 2)  BLOOD CULTURE X 2,   STAT     06/20/19 1957          Vitals/Pain Today's Vitals   06/20/19 2100 06/20/19 2115 06/20/19 2143 06/20/19 2145  BP: (!) 104/50 (!) 104/41  (!) 97/58  Pulse: (!) 132 (!) 115  (!) 114  Resp: (!) 23 17  (!) 21  Temp:  (!) 101.5 F (38.6 C)  (!) 101.1 F (38.4 C)  TempSrc:      SpO2: 100% 100%  100%  Weight:      Height:      PainSc:   0-No pain     Isolation Precautions No active isolations  Medications Medications  ceFEPIme (MAXIPIME) 2 g in sodium chloride 0.9 % 100 mL IVPB (has no administration in time range)  vancomycin (VANCOCIN) IVPB 1000 mg/200 mL premix (has no administration in time range)  ceFEPIme (MAXIPIME) 2 g in sodium chloride 0.9 % 100 mL IVPB (0 g Intravenous Stopped 06/20/19 2134)  metroNIDAZOLE (FLAGYL) IVPB 500 mg (500 mg Intravenous New Bag/Given 06/20/19 2136)  vancomycin (VANCOCIN) IVPB 1000 mg/200 mL premix (0 mg Intravenous Stopped 06/20/19 2143)  sodium chloride 0.9 % bolus 1,000 mL (0 mLs Intravenous Stopped 06/20/19 2143)    And  sodium chloride 0.9 % bolus 1,000 mL (1,000 mLs Intravenous New Bag/Given 06/20/19 2023)  acetaminophen (TYLENOL) 325 MG tablet (650 mg  Given 06/20/19 2022)    Mobility manual wheelchair High fall risk   Focused Assessments A&O   R Recommendations: See Admitting Provider Note  Report given to:   Additional Notes:

## 2019-06-21 ENCOUNTER — Encounter (HOSPITAL_COMMUNITY): Payer: Self-pay | Admitting: Internal Medicine

## 2019-06-21 ENCOUNTER — Inpatient Hospital Stay (HOSPITAL_COMMUNITY): Payer: BC Managed Care – PPO

## 2019-06-21 DIAGNOSIS — I10 Essential (primary) hypertension: Secondary | ICD-10-CM

## 2019-06-21 DIAGNOSIS — I2699 Other pulmonary embolism without acute cor pulmonale: Secondary | ICD-10-CM | POA: Diagnosis present

## 2019-06-21 DIAGNOSIS — D649 Anemia, unspecified: Secondary | ICD-10-CM

## 2019-06-21 DIAGNOSIS — E871 Hypo-osmolality and hyponatremia: Secondary | ICD-10-CM

## 2019-06-21 DIAGNOSIS — N179 Acute kidney failure, unspecified: Secondary | ICD-10-CM

## 2019-06-21 DIAGNOSIS — G834 Cauda equina syndrome: Secondary | ICD-10-CM | POA: Diagnosis present

## 2019-06-21 DIAGNOSIS — L89152 Pressure ulcer of sacral region, stage 2: Secondary | ICD-10-CM | POA: Diagnosis present

## 2019-06-21 DIAGNOSIS — G9341 Metabolic encephalopathy: Secondary | ICD-10-CM

## 2019-06-21 DIAGNOSIS — A419 Sepsis, unspecified organism: Secondary | ICD-10-CM

## 2019-06-21 LAB — BASIC METABOLIC PANEL
Anion gap: 10 (ref 5–15)
BUN: 17 mg/dL (ref 6–20)
CO2: 22 mmol/L (ref 22–32)
Calcium: 8.2 mg/dL — ABNORMAL LOW (ref 8.9–10.3)
Chloride: 103 mmol/L (ref 98–111)
Creatinine, Ser: 1.01 mg/dL — ABNORMAL HIGH (ref 0.44–1.00)
GFR calc Af Amer: >60 mL/min (ref >60)
GFR calc non Af Amer: >60 mL/min (ref >60)
Glucose, Bld: 86 mg/dL (ref 70–99)
Potassium: 3.2 mmol/L — ABNORMAL LOW (ref 3.5–5.1)
Sodium: 135 mmol/L (ref 135–145)

## 2019-06-21 LAB — URINE CULTURE: Culture: NO GROWTH

## 2019-06-21 LAB — CBC
HCT: 23.8 % — ABNORMAL LOW (ref 36.0–46.0)
Hemoglobin: 7.4 g/dL — ABNORMAL LOW (ref 12.0–15.0)
MCH: 27.4 pg (ref 26.0–34.0)
MCHC: 31.1 g/dL (ref 30.0–36.0)
MCV: 88.1 fL (ref 80.0–100.0)
Platelets: 372 10*3/uL (ref 150–400)
RBC: 2.7 MIL/uL — ABNORMAL LOW (ref 3.87–5.11)
RDW: 19.9 % — ABNORMAL HIGH (ref 11.5–15.5)
WBC: 7.5 10*3/uL (ref 4.0–10.5)
nRBC: 0 % (ref 0.0–0.2)

## 2019-06-21 LAB — GLUCOSE, CAPILLARY
Glucose-Capillary: 119 mg/dL — ABNORMAL HIGH (ref 70–99)
Glucose-Capillary: 65 mg/dL — ABNORMAL LOW (ref 70–99)
Glucose-Capillary: 65 mg/dL — ABNORMAL LOW (ref 70–99)
Glucose-Capillary: 82 mg/dL (ref 70–99)
Glucose-Capillary: 83 mg/dL (ref 70–99)
Glucose-Capillary: 89 mg/dL (ref 70–99)

## 2019-06-21 LAB — PROCALCITONIN: Procalcitonin: 3.2 ng/mL

## 2019-06-21 LAB — HIV ANTIBODY (ROUTINE TESTING W REFLEX): HIV Screen 4th Generation wRfx: NONREACTIVE

## 2019-06-21 LAB — LACTIC ACID, PLASMA: Lactic Acid, Venous: 1.1 mmol/L (ref 0.5–1.9)

## 2019-06-21 LAB — MRSA PCR SCREENING: MRSA by PCR: NEGATIVE

## 2019-06-21 MED ORDER — HYOSCYAMINE SULFATE 0.125 MG PO TABS
0.1250 mg | ORAL_TABLET | ORAL | Status: DC | PRN
  Filled 2019-06-21: qty 2

## 2019-06-21 MED ORDER — SODIUM CHLORIDE 0.9 % IV BOLUS
1000.0000 mL | Freq: Once | INTRAVENOUS | Status: AC
  Administered 2019-06-21: 1000 mL via INTRAVENOUS

## 2019-06-21 MED ORDER — HYDROMORPHONE HCL 1 MG/ML IJ SOLN
1.0000 mg | INTRAMUSCULAR | Status: DC | PRN
  Administered 2019-06-21 – 2019-07-05 (×35): 1 mg via INTRAVENOUS
  Filled 2019-06-21 (×36): qty 1

## 2019-06-21 MED ORDER — GABAPENTIN 100 MG PO CAPS
100.0000 mg | ORAL_CAPSULE | Freq: Three times a day (TID) | ORAL | Status: DC
  Administered 2019-06-21 – 2019-07-05 (×39): 100 mg via ORAL
  Filled 2019-06-21 (×42): qty 1

## 2019-06-21 MED ORDER — RIVAROXABAN 20 MG PO TABS
20.0000 mg | ORAL_TABLET | Freq: Every day | ORAL | Status: DC
  Administered 2019-06-21 – 2019-06-22 (×2): 20 mg via ORAL
  Filled 2019-06-21 (×2): qty 1

## 2019-06-21 MED ORDER — LIP MEDEX EX OINT
TOPICAL_OINTMENT | CUTANEOUS | Status: AC
  Administered 2019-06-21: 08:00:00
  Filled 2019-06-21: qty 7

## 2019-06-21 MED ORDER — VANCOMYCIN HCL 10 G IV SOLR
1250.0000 mg | INTRAVENOUS | Status: DC
  Administered 2019-06-21 – 2019-06-22 (×2): 1250 mg via INTRAVENOUS
  Filled 2019-06-21 (×2): qty 1250

## 2019-06-21 MED ORDER — INFLUENZA VAC SPLIT QUAD 0.5 ML IM SUSY
0.5000 mL | PREFILLED_SYRINGE | INTRAMUSCULAR | Status: AC
  Administered 2019-06-22: 0.5 mL via INTRAMUSCULAR
  Filled 2019-06-21: qty 0.5

## 2019-06-21 MED ORDER — CHLORHEXIDINE GLUCONATE CLOTH 2 % EX PADS
6.0000 | MEDICATED_PAD | Freq: Every day | CUTANEOUS | Status: DC
  Administered 2019-06-21 – 2019-07-04 (×14): 6 via TOPICAL

## 2019-06-21 MED ORDER — ONDANSETRON HCL 4 MG/2ML IJ SOLN
4.0000 mg | Freq: Four times a day (QID) | INTRAMUSCULAR | Status: DC | PRN
  Administered 2019-06-21 – 2019-07-05 (×21): 4 mg via INTRAVENOUS
  Filled 2019-06-21 (×22): qty 2

## 2019-06-21 MED ORDER — SODIUM CHLORIDE 0.9 % IV SOLN
INTRAVENOUS | Status: DC
  Administered 2019-06-21: 05:00:00 via INTRAVENOUS

## 2019-06-21 MED ORDER — MIRTAZAPINE 15 MG PO TABS
7.5000 mg | ORAL_TABLET | Freq: Every day | ORAL | Status: DC
  Administered 2019-06-22 – 2019-07-04 (×13): 7.5 mg via ORAL
  Filled 2019-06-21 (×14): qty 1

## 2019-06-21 MED ORDER — TIZANIDINE HCL 4 MG PO TABS
4.0000 mg | ORAL_TABLET | Freq: Three times a day (TID) | ORAL | Status: DC
  Administered 2019-06-21 – 2019-06-28 (×19): 4 mg via ORAL
  Filled 2019-06-21 (×21): qty 1

## 2019-06-21 MED ORDER — IOHEXOL 300 MG/ML  SOLN
30.0000 mL | Freq: Once | INTRAMUSCULAR | Status: AC | PRN
  Administered 2019-06-21: 17:00:00 30 mL via ORAL

## 2019-06-21 MED ORDER — POTASSIUM CHLORIDE 10 MEQ/100ML IV SOLN
10.0000 meq | Freq: Once | INTRAVENOUS | Status: AC
  Administered 2019-06-21: 15:00:00 10 meq via INTRAVENOUS
  Filled 2019-06-21: qty 100

## 2019-06-21 MED ORDER — ACETAMINOPHEN 650 MG RE SUPP
650.0000 mg | Freq: Four times a day (QID) | RECTAL | Status: DC | PRN
  Administered 2019-06-22 – 2019-06-23 (×2): 650 mg via RECTAL
  Filled 2019-06-21 (×2): qty 1

## 2019-06-21 MED ORDER — IOHEXOL 300 MG/ML  SOLN
100.0000 mL | Freq: Once | INTRAMUSCULAR | Status: AC | PRN
  Administered 2019-06-21: 17:00:00 100 mL via INTRAVENOUS

## 2019-06-21 MED ORDER — DEXTROSE 50 % IV SOLN
INTRAVENOUS | Status: AC
  Administered 2019-06-21: 08:00:00 25 mL
  Filled 2019-06-21: qty 50

## 2019-06-21 MED ORDER — SODIUM CHLORIDE 0.9% FLUSH
10.0000 mL | Freq: Two times a day (BID) | INTRAVENOUS | Status: DC
  Administered 2019-06-21: 20 mL
  Administered 2019-06-22 – 2019-07-05 (×23): 10 mL

## 2019-06-21 MED ORDER — SODIUM CHLORIDE 0.9% FLUSH: 10.0000 mL | INTRAVENOUS | Status: DC | PRN

## 2019-06-21 MED ORDER — DEXTROSE-NACL 5-0.9 % IV SOLN
INTRAVENOUS | Status: DC
  Administered 2019-06-21: 19:00:00 via INTRAVENOUS
  Administered 2019-06-22: 125 mL/h via INTRAVENOUS
  Administered 2019-06-23: 18:00:00 via INTRAVENOUS
  Administered 2019-06-23: 125 mL/h via INTRAVENOUS

## 2019-06-21 MED ORDER — ACETAMINOPHEN 325 MG PO TABS
650.0000 mg | ORAL_TABLET | Freq: Four times a day (QID) | ORAL | Status: DC | PRN
  Administered 2019-06-21 – 2019-06-25 (×7): 650 mg via ORAL
  Filled 2019-06-21 (×8): qty 2

## 2019-06-21 MED ORDER — ONDANSETRON HCL 4 MG PO TABS
4.0000 mg | ORAL_TABLET | Freq: Four times a day (QID) | ORAL | Status: DC | PRN
  Administered 2019-07-01 – 2019-07-05 (×2): 4 mg via ORAL
  Filled 2019-06-21 (×2): qty 1

## 2019-06-21 MED ORDER — SODIUM CHLORIDE (PF) 0.9 % IJ SOLN
INTRAMUSCULAR | Status: AC
  Filled 2019-06-21: qty 50

## 2019-06-21 MED ORDER — SODIUM CHLORIDE 0.9 % IV BOLUS
500.0000 mL | Freq: Once | INTRAVENOUS | Status: AC
  Administered 2019-06-21: 05:00:00 500 mL via INTRAVENOUS

## 2019-06-21 NOTE — ED Notes (Signed)
ED TO INPATIENT HANDOFF REPORT  ED Nurse Name and Phone #: Claiborne Billings @ [phone]  S Name/Age/Gender Kaylee Randolph 53 y.o. female Room/Bed: MH11/MH11  Code Status   Code Status: Not on file  Home/SNF/Other Home Patient oriented to: self, place and situation Is this baseline? Yes   Triage Complete: Triage complete  Chief Complaint CA PT, FEVER, CONFUSED  Triage Note Metastatic Breast Cancer with mets to spine, abdomen and brain. Family reports that she had a UTI with E-col, she has not been treated. She is febrile and confused per family.    Allergies No Known Allergies  Level of Care/Admitting Diagnosis ED Disposition    ED Disposition Condition [facility] Area: [place] [[id number]]  Level of Care: Stepdown [14]  Admit to SDU based on following criteria: Hemodynamic compromise or significant risk of instability:  Patient requiring short term acute titration and management of vasoactive drips, and invasive monitoring (i.e., CVP and Arterial line).  Covid Evaluation: Confirmed COVID Negative  Diagnosis: HCAP (healthcare-associated pneumonia) [id number]  Admitting Physician: Ivor Costa [[id number]]  Attending Physician: Ivor Costa [phone]  Estimated length of stay: past midnight tomorrow  Certification:: I certify this patient will need inpatient services for at least 2 midnights  PT Class (Do Not Modify): Inpatient [101]  PT Acc Code (Do Not Modify): Private [1]       B Medical/Surgery History Past Medical History:  Diagnosis Date  . Cancer ([place])   . Hypertension    Past Surgical History:  Procedure Laterality Date  . BREAST SURGERY       A IV Location/Drains/Wounds Patient Lines/Drains/Airways Status   Active Line/Drains/Airways    Name:   Placement date:   Placement time:   Site:   Days:   Implanted Port 06/20/19 Chest   06/20/19    2023    Chest   1   Peripheral IV 06/20/19 Right Antecubital   06/20/19    2013    Antecubital   1    Urethral Catheter Daine Gravel, RN  Temperature probe 14 Fr.   06/20/19    2100    Temperature probe   1   Pressure Injury 06/20/19 Coccyx Mid Stage II -  Partial thickness loss of dermis presenting as a shallow open ulcer with a red, pink wound bed without slough.   06/20/19    2102     1          Intake/Output Last 24 hours  Intake/Output Summary (Last 24 hours) at 06/21/2019 0200 Last data filed at 06/21/2019 0147 Gross per 24 hour  Intake 2000 ml  Output 350 ml  Net 1650 ml    Labs/Imaging Results for orders placed or performed during the hospital encounter of 06/20/19 (from the past 48 hour(s))  Comprehensive metabolic panel     Status: Abnormal   Collection Time: 06/20/19  8:00 PM  Result Value Ref Range   Sodium 127 (L) 135 - 145 mmol/L   Potassium 3.9 3.5 - 5.1 mmol/L   Chloride 89 (L) 98 - 111 mmol/L   CO2 24 22 - 32 mmol/L   Glucose, Bld 101 (H) 70 - 99 mg/dL   BUN 21 (H) 6 - 20 mg/dL   Creatinine, Ser 1.82 (H) 0.44 - 1.00 mg/dL   Calcium 8.7 (L) 8.9 - 10.3 mg/dL   Total Protein 5.7 (L) 6.5 - 8.1 g/dL   Albumin 2.6 (L) 3.5 - 5.0 g/dL   AST 20 15 -  41 U/L   ALT 24 0 - 44 U/L   Alkaline Phosphatase 89 38 - 126 U/L   Total Bilirubin 0.6 0.3 - 1.2 mg/dL   GFR calc non Af Amer 31 (L) >60 mL/min   GFR calc Af Amer 36 (L) >60 mL/min   Anion gap 14 5 - 15    Comment: Performed at [facility], [address] [place], [place] [address]  Lactic acid, plasma     Status: Abnormal   Collection Time: 06/20/19  8:00 PM  Result Value Ref Range   Lactic Acid, Venous 2.3 (HH) 0.5 - 1.9 mmol/L    Comment: CRITICAL RESULT CALLED TO, READ BACK BY AND VERIFIED WITH: MEREDITH WALTON RN @2021  06/20/2019 OLSONM Performed at [facility], [address] [place], [place] [address]   CBC with Differential     Status: Abnormal   Collection Time: 06/20/19  8:00 PM  Result Value Ref Range   WBC 7.7 4.0 - 10.5 K/uL   RBC 2.95 (L) 3.87 - 5.11 MIL/uL    Hemoglobin 8.1 (L) 12.0 - 15.0 g/dL   HCT 25.6 (L) 36.0 - 46.0 %   MCV 86.8 80.0 - 100.0 fL   MCH 27.5 26.0 - 34.0 pg   MCHC 31.6 30.0 - 36.0 g/dL   RDW 20.0 (H) 11.5 - 15.5 %   Platelets 456 (H) 150 - 400 K/uL   nRBC 0.3 (H) 0.0 - 0.2 %   Neutrophils Relative % 78 %   Neutro Abs 6.0 1.7 - 7.7 K/uL   Lymphocytes Relative 9 %   Lymphs Abs 0.7 0.7 - 4.0 K/uL   Monocytes Relative 12 %   Monocytes Absolute 0.9 0.1 - 1.0 K/uL   Eosinophils Relative 0 %   Eosinophils Absolute 0.0 0.0 - 0.5 K/uL   Basophils Relative 0 %   Basophils Absolute 0.0 0.0 - 0.1 K/uL   Immature Granulocytes 1 %   Abs Immature Granulocytes 0.10 (H) 0.00 - 0.07 K/uL    Comment: Performed at [facility], [address] [place], [place] [address]  Protime-INR     Status: Abnormal   Collection Time: 06/20/19  8:00 PM  Result Value Ref Range   Prothrombin Time 28.1 (H) 11.4 - 15.2 seconds   INR 2.7 (H) 0.8 - 1.2    Comment: (NOTE) INR goal varies based on device and disease states. Performed at [facility], [address] [place], [place] [address]   APTT     Status: Abnormal   Collection Time: 06/20/19  8:00 PM  Result Value Ref Range   aPTT 62 (H) 24 - 36 seconds    Comment:        IF BASELINE aPTT IS ELEVATED, SUGGEST PATIENT RISK ASSESSMENT BE USED TO DETERMINE APPROPRIATE ANTICOAGULANT THERAPY. Performed at [facility], [address] [place], [place] [address]   SARS Coronavirus 2 [facility] order, Performed in [facility] hospital lab) Nasopharyngeal Nasopharyngeal Swab     Status: None   Collection Time: 06/20/19  8:19 PM   Specimen: Nasopharyngeal Swab  Result Value Ref Range   SARS Coronavirus 2 NEGATIVE NEGATIVE    Comment: (NOTE) If result is NEGATIVE SARS-CoV-2 target nucleic acids are NOT DETECTED. The SARS-CoV-2 RNA is generally detectable in upper and lower  respiratory specimens during the acute phase of infection. The lowest  concentration of  SARS-CoV-2 viral copies this assay can detect is  250  copies / mL. A negative result does not preclude SARS-CoV-2 infection  and should not be used as the sole basis for treatment or other  patient management decisions.  A negative result may occur with  improper specimen collection / handling, submission of specimen other  than nasopharyngeal swab, presence of viral mutation(s) within the  areas targeted by this assay, and inadequate number of viral copies  (<250 copies / mL). A negative result must be combined with clinical  observations, patient history, and epidemiological information. If result is POSITIVE SARS-CoV-2 target nucleic acids are DETECTED. The SARS-CoV-2 RNA is generally detectable in upper and lower  respiratory specimens dur ing the acute phase of infection.  Positive  results are indicative of active infection with SARS-CoV-2.  Clinical  correlation with patient history and other diagnostic information is  necessary to determine patient infection status.  Positive results do  not rule out bacterial infection or co-infection with other viruses. If result is PRESUMPTIVE POSTIVE SARS-CoV-2 nucleic acids MAY BE PRESENT.   A presumptive positive result was obtained on the submitted specimen  and confirmed on repeat testing.  While 2019 novel coronavirus  (SARS-CoV-2) nucleic acids may be present in the submitted sample  additional confirmatory testing may be necessary for epidemiological  and / or clinical management purposes  to differentiate between  SARS-CoV-2 and other Sarbecovirus currently known to infect humans.  If clinically indicated additional testing with an alternate test  methodology [phone]) is advised. The SARS-CoV-2 RNA is generally  detectable in upper and lower respiratory sp ecimens during the acute  phase of infection. The expected result is Negative. Fact Sheet for Patients:  StrictlyIdeas.no Fact Sheet for Healthcare  Providers: BankingDealers.co.za This test is not yet approved or cleared by the [place] FDA and has been authorized for detection and/or diagnosis of SARS-CoV-2 by FDA under an Emergency Use Authorization (EUA).  This EUA will remain in effect (meaning this test can be used) for the duration of the COVID-19 declaration under Section 564(b)(1) of the Act, 21 U.S.C. section 360bbb-3(b)(1), unless the authorization is terminated or revoked sooner. Performed at [facility], [address] [place], [place] [address]   Urinalysis, Routine w reflex microscopic     Status: Abnormal   Collection Time: 06/20/19  8:32 PM  Result Value Ref Range   Color, Urine YELLOW YELLOW   APPearance CLOUDY (A) CLEAR   Specific Gravity, Urine >1.030 (H) 1.005 - 1.030   pH 5.0 5.0 - 8.0   Glucose, UA NEGATIVE NEGATIVE mg/dL   Hgb urine dipstick NEGATIVE NEGATIVE   Bilirubin Urine SMALL (A) NEGATIVE   Ketones, ur NEGATIVE NEGATIVE mg/dL   Protein, ur 30 (A) NEGATIVE mg/dL   Nitrite NEGATIVE NEGATIVE   Leukocytes,Ua NEGATIVE NEGATIVE    Comment: Performed at [facility], [address] [place], [place] [address]  Pregnancy, urine     Status: None   Collection Time: 06/20/19  8:32 PM  Result Value Ref Range   Preg Test, Ur NEGATIVE NEGATIVE    Comment:        THE SENSITIVITY OF THIS METHODOLOGY IS >20 mIU/mL. Performed at [facility], [address] [place], [place] [address]   Urinalysis, Microscopic (reflex)     Status: Abnormal   Collection Time: 06/20/19  8:32 PM  Result Value Ref Range   RBC / HPF 0-5 0 - 5 RBC/hpf   WBC, UA 0-5 0 - 5  WBC/hpf   Bacteria, UA FEW (A) NONE SEEN   Squamous Epithelial / LPF 0-5 0 - 5   Mucus PRESENT    Hyaline Casts, UA PRESENT    Amorphous Crystal PRESENT     Comment: Performed at [facility], [address] [place], [place] [address]  Lactic acid, plasma     Status: Abnormal    Collection Time: 06/20/19  9:42 PM  Result Value Ref Range   Lactic Acid, Venous 2.4 (HH) 0.5 - 1.9 mmol/L    Comment: CRITICAL RESULT CALLED TO, READ BACK BY AND VERIFIED WITH: WALTON,M,RN @ 2206 06/20/19 BY GWYN,P Performed at [facility], [address], [place], [place] [address]    Dg Chest Port 1 View  Result Date: 06/20/2019 CLINICAL DATA:  53 year old female with history of metastatic breast cancer. Patient is febrile and confused. EXAM: PORTABLE CHEST 1 VIEW COMPARISON:  No priors. FINDINGS: Low lung volumes. Opacity at the right base which may reflect atelectasis and/or consolidation. Small to moderate right pleural effusion. Left lung is clear. No left pleural effusion. No evidence of pulmonary edema. Heart size is normal. Upper mediastinal contours are within normal limits. Right internal jugular single-lumen porta cath with tip terminating in the mid superior vena cava. IMPRESSION: 1. Atelectasis and/or consolidation in the right lung base with small to moderate right pleural effusion. Electronically Signed   By: Vinnie Langton M.D.   On: 06/20/2019 20:28    Pending Labs Unresulted Labs (From admission, onward)    Start     Ordered   06/20/19 2013  Urine culture  ONCE - STAT,   STAT     06/20/19 2016   06/20/19 1957  Culture, blood (Routine x 2)  BLOOD CULTURE X 2,   STAT     06/20/19 1957          Vitals/Pain Today's Vitals   06/21/19 0100 06/21/19 0115 06/21/19 0130 06/21/19 0145  BP: 106/66 113/80 113/67 (!) 104/57  Pulse:    (!) 121  Resp: 17 17 20 16   Temp: (!) 100.6 F (38.1 C) (!) 100.6 F (38.1 C) (!) 100.8 F (38.2 C) (!) 100.8 F (38.2 C)  TempSrc:      SpO2:    100%  Weight:      Height:      PainSc:        Isolation Precautions No active isolations  Medications Medications  ceFEPIme (MAXIPIME) 2 g in sodium chloride 0.9 % 100 mL IVPB (has no administration in time range)  vancomycin (VANCOCIN) IVPB 1000 mg/200 mL premix (has no  administration in time range)  0.9 %  sodium chloride infusion ( Intravenous New Bag/Given 06/21/19 0006)  ceFEPIme (MAXIPIME) 2 g in sodium chloride 0.9 % 100 mL IVPB (0 g Intravenous Stopped 06/20/19 2134)  metroNIDAZOLE (FLAGYL) IVPB 500 mg (0 mg Intravenous Stopped 06/20/19 2249)  vancomycin (VANCOCIN) IVPB 1000 mg/200 mL premix (0 mg Intravenous Stopped 06/20/19 2143)  sodium chloride 0.9 % bolus 1,000 mL (0 mLs Intravenous Stopped 06/20/19 2143)    And  sodium chloride 0.9 % bolus 1,000 mL (0 mLs Intravenous Stopped 06/20/19 2249)  acetaminophen (TYLENOL) 325 MG tablet (650 mg  Given 06/20/19 2022)  sodium chloride 0.9 % bolus 1,000 mL (0 mLs Intravenous Stopped 06/21/19 0147)    Mobility manual wheelchair High fall risk   Focused Assessments Pulmonary Assessment Handoff:  Lung sounds: L Breath Sounds: Diminished R Breath Sounds: Diminished O2 Device: Room  Air        R Recommendations: See Admitting Provider Note  Report given to:   Additional Notes: IVF@100ns , RA, coccyx wound.

## 2019-06-21 NOTE — Progress Notes (Signed)
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  Patient recently was diagnosed with E. coli UTI which was pansensitive and was treated with ciprofloxacin.  Patient started having fever was less reactive and more sleepy with confusion initially however mental status improved on arrival.  Patient does complain of abdominal pain.  Vitals:   06/21/19 1200 06/21/19 1300  BP: 101/70 103/66  Pulse: (!) 132   Resp: (!) 23   Temp: (!) 101.7 F (38.7 C)   SpO2: 100%       A/P  Sepsis-unclear etiology, both urine culture and blood cultures have been obtained.  Patient started on empiric vancomycin and cefepime.  Review of the CT scan from 05/30/2019 at [facility] showed possibility of enteritis with bacterial peritonitis.  I will obtain CT abdomen pelvis with contrast to rule out intra-abdominal pathology for sepsis.  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  Currently treated with chemotherapy at [facility].  Last dose was a week ago.  Also palliative was consulted in [place].  Acute kidney injury-patient presented with creatinine of 1.82/BUN 21, baseline creatinine 0.5 on 06/17/2019.  Currently resolved.  This morning creatinine was 1.01.  Potassium is 3.2.  Will replace potassium.  Cauda equina syndrome-with bilateral leg weakness right more than left.  Foley catheter has been placed.  Patient self catheterize herself at home.  Pulmonary embolism-continue [place] Hospitalist [phone]

## 2019-06-21 NOTE — Progress Notes (Signed)
Hypoglycemic Event  CBG: 65  Treatment: D50 25 mL (12.5 gm)  Symptoms: None  Follow-up CBG: Time:1330 CBG Result:86  Possible Reasons for Event: Inadequate meal intake  Comments/MD notified:Dr. Mar Daring

## 2019-06-21 NOTE — H&P (Signed)
History and Physical    Kaylee Randolph [id number] DOB: April 16, 1966 DOA: 06/20/2019  Referring MD/NP/PA:   PCP: Maury Dus, MD   Patient coming from:  The patient is coming from home.  At baseline, pt is independent for most of ADL.        Chief Complaint: fever and AMS  HPI: Kaylee Randolph is a 53 y.o. female with medical history significant of metastasized breast cancer to spinal cord and abdomen with peritoneal carcinomatosis (currently being treated with chemo at the [facility]), hypertension, cauda equina due to spinal cord metastases, PE on Xarelto, who presents with fever and altered mental status.  Pt is is doing in and out catheterizations 3 times a day. She recently was diagnosed with E. coli UTI. Reportedly it was pansensitive and she has been on Cipro for the last 4 days. Today she started having  fever, no chills. Per report, patient is less reactive and more sleepy with confusion initially, but her mental status improved at arrival to the floor.  She is alert oriented x4.  Her mental status is normal currently. No headache or neck pain.  Patient denies chest pain, shortness of breath, cough. She has some abdominal distention when her husband says has been there due to some abdominal metastases. Per her husband, it is not more distended than it normally is. She has a little pain across her lower abdomen which she says is typical and unchanged from her baseline. She has nausea, no vomiting or diarrhea. Patient was initially hypotensive with blood pressure 86/75, which improved to 97/58 after giving 2 L normal saline bolus in the ED.  ED Course: pt was found to have have WBC 7.7, negative pregnancy test, lactic acid 2.3, 2.4, INR 2.7, negative COVID-19 test, Urinalysis not impressive, AKI with creatinine 1.82, BUN 21, hyponatremia with sodium 127, temperature 102.1, tachycardia, oxygen saturation 100%. CXR showed possible  onsolidation in the right lung base with small to  moderate right pleural effusion. Patient is admitted to stepdown as inpatient.  Review of Systems:   General: has fevers, no chills, no body weight gain, has fatigue HEENT: no blurry vision, hearing changes or sore throat Respiratory: no dyspnea, coughing, wheezing CV: no chest pain, no palpitations GI: has nausea, mild abdominal pain, no vomiting, diarrhea, constipation GU: no dysuria, burning on urination, increased urinary frequency, hematuria  Ext: no leg edema Neuro: has bilateral leg weakness (right leg is worse than the left), no vision change or hearing loss Skin: has sacral ulcer MSK: No muscle spasm, no deformity, no limitation of range of movement in spin Heme: No easy bruising.  Travel history: No recent long distant travel.  Allergy: No Known Allergies  Past Medical History:  Diagnosis Date  . Cancer ([place])   . Hypertension     Past Surgical History:  Procedure Laterality Date  . BREAST SURGERY      Social History:  reports that she has never smoked. She does not have any smokeless tobacco history on file. She reports previous alcohol use. She reports that she does not use drugs.  Family History:  Family History  Problem Relation Age of Onset  . Huntington's disease Mother   . Huntington's disease Sister      Prior to Admission medications   Not on File    Physical Exam: Vitals:   06/21/19 0245 06/21/19 0300 06/21/19 0400 06/21/19 0415  BP: 93/77 92/60 (!) 94/58   Pulse:    (!) 126  Resp:  19 15  Temp: (!) 101.1 F (38.4 C) (!) 101.1 F (38.4 C)  (!) 101.5 F (38.6 C)  TempSrc:    Core  SpO2:   97% 100%  Weight:      Height:       General: Not in acute distress HEENT:       Eyes: PERRL, EOMI, no scleral icterus.       ENT: No discharge from the ears and nose, no pharynx injection, no tonsillar enlargement.        Neck: No JVD, no bruit, no mass felt. Heme: No neck lymph node enlargement. Cardiac: S1/S2, RRR, No murmurs, No gallops or rubs.  Respiratory:  No rales, wheezing, rhonchi or rubs. GI: Soft, distended, mildly tender, no rebound pain, no organomegaly, BS present. GU: No hematuria Ext: No pitting leg edema bilaterally. 2+DP/PT pulse bilaterally. Musculoskeletal: No joint deformities, No joint redness or warmth, no limitation of ROM in spin. Skin: Has stage II sacral ulcer Neuro: Alert, oriented X3, cranial nerves II-XII grossly intact, has bilateral leg weakness (right leg is worse than the left) Psych: Patient is not psychotic, no suicidal or hemocidal ideation.  Labs on Admission: I have personally reviewed following labs and imaging studies  CBC: Recent Labs  Lab 06/20/19 2000 06/21/19 0523  WBC 7.7 7.5  NEUTROABS 6.0  --   HGB 8.1* 7.4*  HCT 25.6* 23.8*  MCV 86.8 88.1  PLT 456* [id number]   Basic Metabolic Panel: Recent Labs  Lab 06/20/19 2000  NA 127*  K 3.9  CL 89*  CO2 24  GLUCOSE 101*  BUN 21*  CREATININE 1.82*  CALCIUM 8.7*   GFR: Estimated Creatinine Clearance: 30.9 mL/min (A) (by C-G formula based on SCr of 1.82 mg/dL (H)). Liver Function Tests: Recent Labs  Lab 06/20/19 2000  AST 20  ALT 24  ALKPHOS 89  BILITOT 0.6  PROT 5.7*  ALBUMIN 2.6*   No results for input(s): LIPASE, AMYLASE in the last 168 hours. No results for input(s): AMMONIA in the last 168 hours. Coagulation Profile: Recent Labs  Lab 06/20/19 2000  INR 2.7*   Cardiac Enzymes: No results for input(s): CKTOTAL, CKMB, CKMBINDEX, TROPONINI in the last 168 hours. BNP (last 3 results) No results for input(s): PROBNP in the last 8760 hours. HbA1C: No results for input(s): HGBA1C in the last 72 hours. CBG: No results for input(s): GLUCAP in the last 168 hours. Lipid Profile: No results for input(s): CHOL, HDL, LDLCALC, TRIG, CHOLHDL, LDLDIRECT in the last 72 hours. Thyroid Function Tests: No results for input(s): TSH, T4TOTAL, FREET4, T3FREE, THYROIDAB in the last 72 hours. Anemia Panel: No results for input(s):  VITAMINB12, FOLATE, FERRITIN, TIBC, IRON, RETICCTPCT in the last 72 hours. Urine analysis:    Component Value Date/Time   COLORURINE YELLOW 06/20/2019 2032   APPEARANCEUR CLOUDY (A) 06/20/2019 2032   LABSPEC >1.030 (H) 06/20/2019 2032   PHURINE 5.0 06/20/2019 2032   GLUCOSEU NEGATIVE 06/20/2019 2032   HGBUR NEGATIVE 06/20/2019 2032   BILIRUBINUR SMALL (A) 06/20/2019 2032   [place] NEGATIVE 06/20/2019 2032   PROTEINUR 30 (A) 06/20/2019 2032   NITRITE NEGATIVE 06/20/2019 2032   LEUKOCYTESUR NEGATIVE 06/20/2019 2032   Sepsis Labs: @LABRCNTIP (procalcitonin:4,lacticidven:4) ) Recent Results (from the past 240 hour(s))  SARS Coronavirus 2 [facility] order, Performed in [facility] hospital lab) Nasopharyngeal Nasopharyngeal Swab     Status: None   Collection Time: 06/20/19  8:19 PM   Specimen: Nasopharyngeal Swab  Result Value Ref Range Status  SARS Coronavirus 2 NEGATIVE NEGATIVE Final    Comment: (NOTE) If result is NEGATIVE SARS-CoV-2 target nucleic acids are NOT DETECTED. The SARS-CoV-2 RNA is generally detectable in upper and lower  respiratory specimens during the acute phase of infection. The lowest  concentration of SARS-CoV-2 viral copies this assay can detect is 250  copies / mL. A negative result does not preclude SARS-CoV-2 infection  and should not be used as the sole basis for treatment or other  patient management decisions.  A negative result may occur with  improper specimen collection / handling, submission of specimen other  than nasopharyngeal swab, presence of viral mutation(s) within the  areas targeted by this assay, and inadequate number of viral copies  (<250 copies / mL). A negative result must be combined with clinical  observations, patient history, and epidemiological information. If result is POSITIVE SARS-CoV-2 target nucleic acids are DETECTED. The SARS-CoV-2 RNA is generally detectable in upper and lower  respiratory specimens dur ing the acute  phase of infection.  Positive  results are indicative of active infection with SARS-CoV-2.  Clinical  correlation with patient history and other diagnostic information is  necessary to determine patient infection status.  Positive results do  not rule out bacterial infection or co-infection with other viruses. If result is PRESUMPTIVE POSTIVE SARS-CoV-2 nucleic acids MAY BE PRESENT.   A presumptive positive result was obtained on the submitted specimen  and confirmed on repeat testing.  While 2019 novel coronavirus  (SARS-CoV-2) nucleic acids may be present in the submitted sample  additional confirmatory testing may be necessary for epidemiological  and / or clinical management purposes  to differentiate between  SARS-CoV-2 and other Sarbecovirus currently known to infect humans.  If clinically indicated additional testing with an alternate test  methodology [phone]) is advised. The SARS-CoV-2 RNA is generally  detectable in upper and lower respiratory sp ecimens during the acute  phase of infection. The expected result is Negative. Fact Sheet for Patients:  StrictlyIdeas.no Fact Sheet for Healthcare Providers: BankingDealers.co.za This test is not yet approved or cleared by the [place] FDA and has been authorized for detection and/or diagnosis of SARS-CoV-2 by FDA under an Emergency Use Authorization (EUA).  This EUA will remain in effect (meaning this test can be used) for the duration of the COVID-19 declaration under Section 564(b)(1) of the Act, 21 U.S.C. section 360bbb-3(b)(1), unless the authorization is terminated or revoked sooner. Performed at [facility], [address] [place], [place] [address]   MRSA PCR Screening     Status: None   Collection Time: 06/21/19  3:53 AM   Specimen: Nasal Mucosa; Nasopharyngeal  Result Value Ref Range Status   MRSA by PCR NEGATIVE NEGATIVE Final    Comment:        The  GeneXpert MRSA Assay (FDA approved for NASAL specimens only), is one component of a comprehensive MRSA colonization surveillance program. It is not intended to diagnose MRSA infection nor to guide or monitor treatment for MRSA infections. Performed at [facility], [place] [address] [place], [place] [address]      Radiological Exams on Admission: Dg Chest Port 1 View  Result Date: 06/20/2019 CLINICAL DATA:  53 year old female with history of metastatic breast cancer. Patient is febrile and confused. EXAM: PORTABLE CHEST 1 VIEW COMPARISON:  No priors. FINDINGS: Low lung volumes. Opacity at the right base which may reflect atelectasis and/or consolidation. Small to moderate right pleural effusion. Left lung is clear.  No left pleural effusion. No evidence of pulmonary edema. Heart size is normal. Upper mediastinal contours are within normal limits. Right internal jugular single-lumen porta cath with tip terminating in the mid superior vena cava. IMPRESSION: 1. Atelectasis and/or consolidation in the right lung base with small to moderate right pleural effusion. Electronically Signed   By: Vinnie Langton M.D.   On: 06/20/2019 20:28     EKG: Independently reviewed.  Sinus rhythm, QTC 482, tachycardia, low voltage, early R wave progression   Assessment/Plan Principal Problem:   Sepsis ([place]) Active Problems:   Hypertension   Breast cancer metastasized to adrenal gland (HCC)   Acute metabolic encephalopathy   Hyponatremia   AKI (acute kidney injury) ([place])   Normocytic anemia   Cauda equina compression (HCC)   PE (pulmonary thromboembolism) ([place])   Sacral decubitus ulcer, stage II ([place])   Sepsis ([place]): Patient is a septic obese hypotension, fever, tachycardia and tachypnea.  No leukocytosis.  Lactic acid is elevated 2.3, 2.4.  Source of infection is not clear.  Urinalysis not impressive.  Chest x-ray showed some infiltration, but patient does not have any symptoms of  pneumonia.  No cough, shortness of breath or chest pain, clinically does not seem to have pneumonia.  COVID-19 negative.  Her blood pressure responded to IV fluid, improved from 86/75 to 97/58.  -Admit to stepdown as inpatient -Vancomycin and cefepime (patient received 1 dose of Flagyl in ED), -Follow-up blood culture and urine culture -will get Procalcitonin and trend lactic acid levels per sepsis protocol. -IVF: 3.5 L of NS bolus in ED, followed by 100 cc/h   HTN:  -hold Bp meds due to hypotension  Breast cancer: metastasized to adrenal gland, spinal cord and abdomen, has peritoneal carcinomatosis, currently being treated with chemo at the [facility].  Last dose was a week ago.  Palliative consulted in [place]. -f/u oncologist in [place]  Acute metabolic encephalopathy: resolved now.  Patient is currently orientated x3, mental status normal -Observe closely.  Hyponatremia: Na 127.  Likely due to poor oral intake -IV fluid as above -Follow-up of BMP  AKI (acute kidney injury) [facility]): Creatinine 1.82, BUN 21.  Patient had creatinine 0.5 on 06/17/2019.  Possibly due to dehydration and ATN is also possible due to hypotension. -IV fluid as above -Avoid using renal toxic medications, - Follow-up of BMP  Normocytic anemia: Hgb stable. 8.7 on 06/17/19 --> 8.1 -f/u by CBC  Cauda equina compression [facility]): with bilateral leg weakness and sacral ulcer stage II -Consult wound care  PE (pulmonary thromboembolism) ([place]): -continue Xarelto   Inpatient status:  # Patient requires inpatient status due to high intensity of service, high risk for further deterioration and high frequency of surveillance required.  I certify that at the point of admission it is my clinical judgment that the patient will require inpatient hospital care spanning beyond 2 midnights from the point of admission.  . This patient has multiple chronic comorbidities including breast cancer to spinal cord and abdomen with peritoneal  carcinomatosis (currently being treated with chemo at the [facility]), hypertension, cauda equina due to spinal cord metastases, PE on Xarelto,  . Now patient has presenting symptoms include fever, sepsis, hyponatremia, AKI . The worrisome physical exam findings include abdominal distention and mild tenderness, bilateral leg weakness . The initial radiographic and laboratory data are worrisome because of hyponatremia, AKI . Current medical needs: please see my assessment and plan . Predictability of an adverse outcome (risk): Patient has multiple comorbidities, now presents with  sepsis with unclear source of infection.  Since patient is immunosuppressed, she is at high risk for deteriorating.  Patient will need to be treated in hospital for at least 2 days.         DVT ppx: on Xarlto Code Status: Full code Family Communication: None at bed side.   Disposition Plan:  Anticipate discharge back to previous home environment Consults called:  none Admission status:  SDU/inpation       Date of Service 06/21/2019    Ivor Costa [facility]   If 7PM-7AM, please contact night-coverage [email or web address] Password [email or web address] 06/21/2019, 6:09 AM

## 2019-06-21 NOTE — Progress Notes (Signed)
Hypoglycemic Event  CBG: 65  Treatment: D50 25 mL (12.5 gm)  Symptoms: Pale  Follow-up CBG: Time:0810 CBG Result:86  Possible Reasons for Event: Inadequate meal intake  Comments/MD notified:Dr. Mar Daring

## 2019-06-21 NOTE — ED Notes (Signed)
[facility] arrived for transport 

## 2019-06-21 NOTE — Progress Notes (Signed)
Pharmacy Antibiotic Note  Kaylee Randolph is a 53 y.o. female admitted on 06/20/2019 with fever and confusion.  She was recently treated with Cipro for E.coli UTI.  Pharmacy has been consulted for vancomycin and cefepime dosing for sepsis.  First doses of antibiotics have been ordered.   Plan: Vanc 1gm x1, then 1250mg  q24 for AUC 540, SCr 1.01 Cefepime 2gm q12  Height: 5\' 4"  (162.6 cm) Weight: 140 lb (63.5 kg) IBW/kg (Calculated) : 54.7  Temp (24hrs), Avg:101.2 F (38.4 C), Min:100.6 F (38.1 C), Max:102.1 F (38.9 C)  Recent Labs  Lab 06/20/19 2000 06/20/19 2142 06/21/19 0523 06/21/19 0528  WBC 7.7  --  7.5  --   CREATININE 1.82*  --  1.01*  --   LATICACIDVEN 2.3* 2.4*  --  1.1    Estimated Creatinine Clearance: 55.6 mL/min (A) (by C-G formula based on SCr of 1.01 mg/dL (H)).    No Known Allergies  Vanc 10/1 >> Cefepime 10/1 >>  Vanc 1gm q36h for AUC 486 using SCr 1.82  10/2 MRSA PCR: neg 10/1 UCx: sent 10/1 BCx: ngtd (one set R antecubital, one set PAC)  [name] PharmD Pager [phone] 06/21/2019, 1:26 PM

## 2019-06-21 NOTE — Consult Note (Signed)
[place] Nurse wound consult note Reason for Consult: Stage 2 Pressure injury to sacrum Wound type: Pressure Pressure Injury POA: Yes Measurement: per flow sheet, 2cm x 2cm x 0.1cm  Wound bed: pink, moist Drainage (amount, consistency, odor) scant serosanguinous Periwound: intact Dressing procedure/placement/frequency: silicone foam has been initiated, bedside RN noted a small amount of serosanguinous exudate so we will implement an antimicrobial nonadherent dressing (Xeroform gauze) changed twice daily beneath the foam.  Turning and repositioning is in place, but guidance is provided for Nursing via the Orders regarding HOB elevation (keeping the HOB at or below a 30 degree angle). Upon transfer to the floor, a mattress replacement with low air loss feature should be continued.  [place] nursing team will not follow, but will remain available to this patient, the nursing and medical teams.  Please re-consult if needed. Thanks, Maudie Flakes, MSN, RN, [place], Arther Abbott  Pager# [phone]

## 2019-06-22 ENCOUNTER — Inpatient Hospital Stay (HOSPITAL_COMMUNITY): Payer: BC Managed Care – PPO

## 2019-06-22 DIAGNOSIS — G834 Cauda equina syndrome: Secondary | ICD-10-CM

## 2019-06-22 DIAGNOSIS — I2699 Other pulmonary embolism without acute cor pulmonale: Secondary | ICD-10-CM

## 2019-06-22 DIAGNOSIS — L89152 Pressure ulcer of sacral region, stage 2: Secondary | ICD-10-CM

## 2019-06-22 LAB — COMPREHENSIVE METABOLIC PANEL
ALT: 18 U/L (ref 0–44)
AST: 15 U/L (ref 15–41)
Albumin: 2.1 g/dL — ABNORMAL LOW (ref 3.5–5.0)
Alkaline Phosphatase: 82 U/L (ref 38–126)
Anion gap: 8 (ref 5–15)
BUN: 19 mg/dL (ref 6–20)
CO2: 20 mmol/L — ABNORMAL LOW (ref 22–32)
Calcium: 8 mg/dL — ABNORMAL LOW (ref 8.9–10.3)
Chloride: 108 mmol/L (ref 98–111)
Creatinine, Ser: 0.78 mg/dL (ref 0.44–1.00)
GFR calc Af Amer: >60 mL/min (ref >60)
GFR calc non Af Amer: >60 mL/min (ref >60)
Glucose, Bld: 113 mg/dL — ABNORMAL HIGH (ref 70–99)
Potassium: 3.2 mmol/L — ABNORMAL LOW (ref 3.5–5.1)
Sodium: 136 mmol/L (ref 135–145)
Total Bilirubin: 0.3 mg/dL (ref 0.3–1.2)
Total Protein: 5.2 g/dL — ABNORMAL LOW (ref 6.5–8.1)

## 2019-06-22 LAB — GLUCOSE, CAPILLARY
Glucose-Capillary: 119 mg/dL — ABNORMAL HIGH (ref 70–99)
Glucose-Capillary: 133 mg/dL — ABNORMAL HIGH (ref 70–99)
Glucose-Capillary: 125 mg/dL — ABNORMAL HIGH (ref 70–99)
Glucose-Capillary: 119 mg/dL — ABNORMAL HIGH (ref 70–99)
Glucose-Capillary: 113 mg/dL — ABNORMAL HIGH (ref 70–99)
Glucose-Capillary: 118 mg/dL — ABNORMAL HIGH (ref 70–99)

## 2019-06-22 LAB — CBC
HCT: 23.3 % — ABNORMAL LOW (ref 36.0–46.0)
Hemoglobin: 7.2 g/dL — ABNORMAL LOW (ref 12.0–15.0)
MCH: 27.4 pg (ref 26.0–34.0)
MCHC: 30.9 g/dL (ref 30.0–36.0)
MCV: 88.6 fL (ref 80.0–100.0)
Platelets: 386 10*3/uL (ref 150–400)
RBC: 2.63 MIL/uL — ABNORMAL LOW (ref 3.87–5.11)
RDW: 20 % — ABNORMAL HIGH (ref 11.5–15.5)
WBC: 9.9 10*3/uL (ref 4.0–10.5)
nRBC: 0 % (ref 0.0–0.2)

## 2019-06-22 MED ORDER — PROMETHAZINE HCL 25 MG/ML IJ SOLN
12.5000 mg | Freq: Four times a day (QID) | INTRAMUSCULAR | Status: DC | PRN
  Administered 2019-06-22 – 2019-07-03 (×9): 12.5 mg via INTRAVENOUS
  Filled 2019-06-22 (×9): qty 1

## 2019-06-22 MED ORDER — METOCLOPRAMIDE HCL 5 MG/ML IJ SOLN
10.0000 mg | Freq: Once | INTRAMUSCULAR | Status: AC
  Administered 2019-06-22: 10 mg via INTRAVENOUS
  Filled 2019-06-22: qty 2

## 2019-06-22 MED ORDER — ENOXAPARIN SODIUM 40 MG/0.4ML ~~LOC~~ SOLN: 40.0000 mg | SUBCUTANEOUS | Status: DC

## 2019-06-22 MED ORDER — METOPROLOL TARTRATE 5 MG/5ML IV SOLN
5.0000 mg | Freq: Once | INTRAVENOUS | Status: AC
  Administered 2019-06-23: 5 mg via INTRAVENOUS
  Filled 2019-06-22: qty 5

## 2019-06-22 MED ORDER — SODIUM CHLORIDE 0.9 % IV BOLUS
1000.0000 mL | Freq: Once | INTRAVENOUS | Status: AC
  Administered 2019-06-22: 1000 mL via INTRAVENOUS

## 2019-06-22 MED ORDER — PHENOL 1.4 % MT LIQD
1.0000 | OROMUCOSAL | Status: DC | PRN
  Filled 2019-06-22: qty 177

## 2019-06-22 MED ORDER — HEPARIN (PORCINE) 25000 UT/250ML-% IV SOLN
900.0000 [IU]/h | INTRAVENOUS | Status: DC
  Administered 2019-06-23 – 2019-06-24 (×2): 1000 [IU]/h via INTRAVENOUS
  Administered 2019-06-25: 13:00:00 900 [IU]/h via INTRAVENOUS
  Filled 2019-06-22 (×3): qty 250

## 2019-06-22 MED ORDER — SODIUM CHLORIDE 0.9 % IV SOLN
2.0000 g | Freq: Three times a day (TID) | INTRAVENOUS | Status: DC
  Administered 2019-06-22 – 2019-06-25 (×9): 2 g via INTRAVENOUS
  Filled 2019-06-22 (×9): qty 2

## 2019-06-22 MED ORDER — POTASSIUM CHLORIDE 10 MEQ/100ML IV SOLN: 10.0000 meq | INTRAVENOUS | Status: DC

## 2019-06-22 MED ORDER — POTASSIUM CHLORIDE 10 MEQ/100ML IV SOLN
10.0000 meq | INTRAVENOUS | Status: AC
  Administered 2019-06-22 (×2): 10 meq via INTRAVENOUS
  Filled 2019-06-22 (×2): qty 100

## 2019-06-22 MED ORDER — HYDROMORPHONE HCL 1 MG/ML IJ SOLN
1.0000 mg | Freq: Once | INTRAMUSCULAR | Status: AC
  Administered 2019-06-22: 1 mg via INTRAVENOUS
  Filled 2019-06-22: qty 1

## 2019-06-22 NOTE — Consult Note (Addendum)
Reason for Consult:Small Bowel Obstruction Referring Physician: Dr. Theodoro Kos is an 53 y.o. female.  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  She was supposed to continue chemotherapy at [facility] on 06/17/2019, but was unable to begin this treatment due to E. Coli UTI.  She was started on treatment with ciprofloxacin on 06/17/2019.  On 06/20/2019, she became febrile and lethargic, and presented to [place] ED.    At [facility], she was found to have elevated lactic acid 2.4, tachycardia, tachypnea and hypotension, and was admitted for evaluation.  Thursday night, she began vomiting.  She had several episodes of vomiting yesterday and today.  Her abdomen has also become distended over the past day.  She had one small bowel movement this morning.  CT abdomen demonstrates stomach and small bowel distension up to 3.9 cm with overt fecalization in the distal small bowel consistent with small bowel obstruction.    Past Medical History:  Diagnosis Date   Cancer [facility])    Hypertension     Past Surgical History:  Procedure Laterality Date   BREAST SURGERY      Family History  Problem Relation Age of Onset   Huntington's disease Mother    Huntington's disease Sister     Social History:  reports that she has never smoked. She does not have any smokeless tobacco history on file. She reports previous alcohol use. She reports that she does not use drugs.  Allergies: No Known Allergies  Medications: I have reviewed the patient's current medications.  Results for orders placed or performed during the hospital encounter of 06/20/19 (from the past 48 hour(s))  Comprehensive metabolic panel     Status: Abnormal   Collection Time: 06/20/19  8:00 PM  Result Value Ref Range   Sodium 127 (L) 135 - 145 mmol/L   Potassium 3.9 3.5 - 5.1 mmol/L   Chloride 89 (L) 98 - 111 mmol/L   CO2 24 22 - 32  mmol/L   Glucose, Bld 101 (H) 70 - 99 mg/dL   BUN 21 (H) 6 - 20 mg/dL   Creatinine, Ser 1.82 (H) 0.44 - 1.00 mg/dL   Calcium 8.7 (L) 8.9 - 10.3 mg/dL   Total Protein 5.7 (L) 6.5 - 8.1 g/dL   Albumin 2.6 (L) 3.5 - 5.0 g/dL   AST 20 15 - 41 U/L   ALT 24 0 - 44 U/L   Alkaline Phosphatase 89 38 - 126 U/L   Total Bilirubin 0.6 0.3 - 1.2 mg/dL   GFR calc non Af Amer 31 (L) >60 mL/min   GFR calc Af Amer 36 (L) >60 mL/min   Anion gap 14 5 - 15    Comment: Performed at [facility], [address] [place], [place] [address]  Lactic acid, plasma     Status: Abnormal   Collection Time: 06/20/19  8:00 PM  Result Value Ref Range   Lactic Acid, Venous 2.3 (HH) 0.5 - 1.9 mmol/L    Comment: CRITICAL RESULT CALLED TO, READ BACK BY AND VERIFIED WITH: Jaci Carrel RN @2021  06/20/2019 OLSONM Performed at [facility], [address] [place], [place] [address]   CBC with Differential     Status: Abnormal   Collection Time: 06/20/19  8:00 PM  Result Value Ref Range   WBC 7.7 4.0 - 10.5 K/uL   RBC  2.95 (L) 3.87 - 5.11 MIL/uL   Hemoglobin 8.1 (L) 12.0 - 15.0 g/dL   HCT 25.6 (L) 36.0 - 46.0 %   MCV 86.8 80.0 - 100.0 fL   MCH 27.5 26.0 - 34.0 pg   MCHC 31.6 30.0 - 36.0 g/dL   RDW 20.0 (H) 11.5 - 15.5 %   Platelets 456 (H) 150 - 400 K/uL   nRBC 0.3 (H) 0.0 - 0.2 %   Neutrophils Relative % 78 %   Neutro Abs 6.0 1.7 - 7.7 K/uL   Lymphocytes Relative 9 %   Lymphs Abs 0.7 0.7 - 4.0 K/uL   Monocytes Relative 12 %   Monocytes Absolute 0.9 0.1 - 1.0 K/uL   Eosinophils Relative 0 %   Eosinophils Absolute 0.0 0.0 - 0.5 K/uL   Basophils Relative 0 %   Basophils Absolute 0.0 0.0 - 0.1 K/uL   Immature Granulocytes 1 %   Abs Immature Granulocytes 0.10 (H) 0.00 - 0.07 K/uL    Comment: Performed at [facility], [address] [place], [place] [address]  Protime-INR     Status: Abnormal   Collection Time: 06/20/19  8:00 PM  Result Value Ref Range   Prothrombin Time  28.1 (H) 11.4 - 15.2 seconds   INR 2.7 (H) 0.8 - 1.2    Comment: (NOTE) INR goal varies based on device and disease states. Performed at [facility], [address] [place], [place] [address]   Culture, blood (Routine x 2)     Status: None (Preliminary result)   Collection Time: 06/20/19  8:00 PM   Specimen: BLOOD  Result Value Ref Range   Specimen Description      BLOOD CHEST PORTA CATH Performed at [facility], [address] [place], [place] [address]    Special Requests      BOTTLES DRAWN AEROBIC AND ANAEROBIC Blood Culture adequate volume Performed at [facility], [address] [place], [place] [address]    Culture      NO GROWTH < 12 HOURS Performed at [facility], [place] [address] [place], [place] [address]    Report Status PENDING   APTT     Status: Abnormal   Collection Time: 06/20/19  8:00 PM  Result Value Ref Range   aPTT 62 (H) 24 - 36 seconds    Comment:        IF BASELINE aPTT IS ELEVATED, SUGGEST PATIENT RISK ASSESSMENT BE USED TO DETERMINE APPROPRIATE ANTICOAGULANT THERAPY. Performed at [facility], [address] [place], [place] [address]   Culture, blood (Routine x 2)     Status: None (Preliminary result)   Collection Time: 06/20/19  8:10 PM   Specimen: BLOOD  Result Value Ref Range   Specimen Description      BLOOD RIGHT ANTECUBITAL Performed at [facility], [address] [place], [place] [address]    Special Requests      BOTTLES DRAWN AEROBIC AND ANAEROBIC Blood Culture adequate volume Performed at [facility], [address] [place], [place] [address]    Culture      NO GROWTH < 12 HOURS Performed at [facility], [place] [address] [place], [place] [address]    Report Status PENDING   SARS Coronavirus 2 [facility] order, Performed in [facility] hospital lab) Nasopharyngeal Nasopharyngeal Swab     Status: None   Collection  Time: 06/20/19  8:19 PM    Specimen: Nasopharyngeal Swab  Result Value Ref Range   SARS Coronavirus 2 NEGATIVE NEGATIVE    Comment: (NOTE) If result is NEGATIVE SARS-CoV-2 target nucleic acids are NOT DETECTED. The SARS-CoV-2 RNA is generally detectable in upper and lower  respiratory specimens during the acute phase of infection. The lowest  concentration of SARS-CoV-2 viral copies this assay can detect is 250  copies / mL. A negative result does not preclude SARS-CoV-2 infection  and should not be used as the sole basis for treatment or other  patient management decisions.  A negative result may occur with  improper specimen collection / handling, submission of specimen other  than nasopharyngeal swab, presence of viral mutation(s) within the  areas targeted by this assay, and inadequate number of viral copies  (<250 copies / mL). A negative result must be combined with clinical  observations, patient history, and epidemiological information. If result is POSITIVE SARS-CoV-2 target nucleic acids are DETECTED. The SARS-CoV-2 RNA is generally detectable in upper and lower  respiratory specimens dur ing the acute phase of infection.  Positive  results are indicative of active infection with SARS-CoV-2.  Clinical  correlation with patient history and other diagnostic information is  necessary to determine patient infection status.  Positive results do  not rule out bacterial infection or co-infection with other viruses. If result is PRESUMPTIVE POSTIVE SARS-CoV-2 nucleic acids MAY BE PRESENT.   A presumptive positive result was obtained on the submitted specimen  and confirmed on repeat testing.  While 2019 novel coronavirus  (SARS-CoV-2) nucleic acids may be present in the submitted sample  additional confirmatory testing may be necessary for epidemiological  and / or clinical management purposes  to differentiate between  SARS-CoV-2 and other Sarbecovirus currently known to infect humans.  If clinically  indicated additional testing with an alternate test  methodology [phone]) is advised. The SARS-CoV-2 RNA is generally  detectable in upper and lower respiratory sp ecimens during the acute  phase of infection. The expected result is Negative. Fact Sheet for Patients:  StrictlyIdeas.no Fact Sheet for Healthcare Providers: BankingDealers.co.za This test is not yet approved or cleared by the [place] FDA and has been authorized for detection and/or diagnosis of SARS-CoV-2 by FDA under an Emergency Use Authorization (EUA).  This EUA will remain in effect (meaning this test can be used) for the duration of the COVID-19 declaration under Section 564(b)(1) of the Act, 21 U.S.C. section 360bbb-3(b)(1), unless the authorization is terminated or revoked sooner. Performed at [facility], [address] [place] , [place] [address]   Urinalysis, Routine w reflex microscopic     Status: Abnormal   Collection Time: 06/20/19  8:32 PM  Result Value Ref Range   Color, Urine YELLOW YELLOW   APPearance CLOUDY (A) CLEAR   Specific Gravity, Urine >1.030 (H) 1.005 - 1.030   pH 5.0 5.0 - 8.0   Glucose, UA NEGATIVE NEGATIVE mg/dL   Hgb urine dipstick NEGATIVE NEGATIVE   Bilirubin Urine SMALL (A) NEGATIVE   Ketones, ur NEGATIVE NEGATIVE mg/dL   Protein, ur 30 (A) NEGATIVE mg/dL   Nitrite NEGATIVE NEGATIVE   Leukocytes,Ua NEGATIVE NEGATIVE    Comment: Performed at [facility], [address] [place], [place] [address]  Pregnancy, urine     Status: None   Collection Time: 06/20/19  8:32 PM  Result Value Ref Range   Preg Test, Ur NEGATIVE NEGATIVE    Comment:  THE SENSITIVITY OF THIS METHODOLOGY IS >20 mIU/mL. Performed at [facility], [address], [place], [place] [address]   Urine culture     Status: None   Collection Time: 06/20/19  8:32 PM   Specimen: In/Out Cath Urine  Result Value Ref Range    Specimen Description      IN/OUT CATH URINE Performed at [facility], [address] [place], [place] [address]    Special Requests      NONE Performed at [facility], [address] [place], [place] [address]    Culture      NO GROWTH Performed at [facility], [place] [address], [place], [place] [address]    Report Status 06/21/2019 FINAL   Urinalysis, Microscopic (reflex)     Status: Abnormal   Collection Time: 06/20/19  8:32 PM  Result Value Ref Range   RBC / HPF 0-5 0 - 5 RBC/hpf   WBC, UA 0-5 0 - 5 WBC/hpf   Bacteria, UA FEW (A) NONE SEEN   Squamous Epithelial / LPF 0-5 0 - 5   Mucus PRESENT    Hyaline Casts, UA PRESENT    Amorphous Crystal PRESENT     Comment: Performed at [facility], [address] [place], [place] [address]  Lactic acid, plasma     Status: Abnormal   Collection Time: 06/20/19  9:42 PM  Result Value Ref Range   Lactic Acid, Venous 2.4 (HH) 0.5 - 1.9 mmol/L    Comment: CRITICAL RESULT CALLED TO, READ BACK BY AND VERIFIED WITH: WALTON,M,RN @ 2206 06/20/19 BY GWYN,P Performed at [facility], [address] [place], [place] [address]   MRSA PCR Screening     Status: None   Collection Time: 06/21/19  3:53 AM   Specimen: Nasal Mucosa; Nasopharyngeal  Result Value Ref Range   MRSA by PCR NEGATIVE NEGATIVE    Comment:        The GeneXpert MRSA Assay (FDA approved for NASAL specimens only), is one component of a comprehensive MRSA colonization surveillance program. It is not intended to diagnose MRSA infection nor to guide or monitor treatment for MRSA infections. Performed at [facility], [place] [address], [place], [place] [address]   Procalcitonin     Status: None   Collection Time: 06/21/19  5:23 AM  Result Value Ref Range   Procalcitonin 3.20 ng/mL    Comment:        Interpretation: PCT > 2 ng/mL: Systemic infection (sepsis) is likely, unless other causes are  known. (NOTE)       Sepsis PCT Algorithm           Lower Respiratory Tract                                      Infection PCT Algorithm    ----------------------------     ----------------------------         PCT < 0.25 ng/mL                PCT < 0.10 ng/mL         Strongly encourage             Strongly discourage   discontinuation of antibiotics    initiation of antibiotics    ----------------------------     -----------------------------  PCT 0.25 - 0.50 ng/mL            PCT 0.10 - 0.25 ng/mL               OR       >80% decrease in PCT            Discourage initiation of                                            antibiotics      Encourage discontinuation           of antibiotics    ----------------------------     -----------------------------         PCT >= 0.50 ng/mL              PCT 0.26 - 0.50 ng/mL               AND       <80% decrease in PCT              Encourage initiation of                                             antibiotics       Encourage continuation           of antibiotics    ----------------------------     -----------------------------        PCT >= 0.50 ng/mL                  PCT > 0.50 ng/mL               AND         increase in PCT                  Strongly encourage                                      initiation of antibiotics    Strongly encourage escalation           of antibiotics                                     -----------------------------                                           PCT <= 0.25 ng/mL                                                 OR                                        > 80% decrease in PCT  Discontinue / Do not initiate                                             antibiotics Performed at [facility] [address], [place], [place] [address]   HIV Antibody (routine testing w rflx)     Status: None   Collection Time: 06/21/19  5:23 AM  Result Value Ref Range    HIV Screen 4th Generation wRfx NON REACTIVE NON REACTIVE    Comment: Performed at [facility] [address], [place], [place] [id number]  Basic metabolic panel     Status: Abnormal   Collection Time: 06/21/19  5:23 AM  Result Value Ref Range   Sodium 135 135 - 145 mmol/L   Potassium 3.2 (L) 3.5 - 5.1 mmol/L   Chloride 103 98 - 111 mmol/L   CO2 22 22 - 32 mmol/L   Glucose, Bld 86 70 - 99 mg/dL   BUN 17 6 - 20 mg/dL   Creatinine, Ser 1.01 (H) 0.44 - 1.00 mg/dL   Calcium 8.2 (L) 8.9 - 10.3 mg/dL   GFR calc non Af Amer >60 >60 mL/min   GFR calc Af Amer >60 >60 mL/min   Anion gap 10 5 - 15    Comment: Performed at [facility], [place] [address] [place], [place] [address]  CBC     Status: Abnormal   Collection Time: 06/21/19  5:23 AM  Result Value Ref Range   WBC 7.5 4.0 - 10.5 K/uL   RBC 2.70 (L) 3.87 - 5.11 MIL/uL   Hemoglobin 7.4 (L) 12.0 - 15.0 g/dL   HCT 23.8 (L) 36.0 - 46.0 %   MCV 88.1 80.0 - 100.0 fL   MCH 27.4 26.0 - 34.0 pg   MCHC 31.1 30.0 - 36.0 g/dL   RDW 19.9 (H) 11.5 - 15.5 %   Platelets 372 150 - 400 K/uL   nRBC 0.0 0.0 - 0.2 %    Comment: Performed at [facility], [place] [address], [place], [place] [address]  Lactic acid, plasma     Status: None   Collection Time: 06/21/19  5:28 AM  Result Value Ref Range   Lactic Acid, Venous 1.1 0.5 - 1.9 mmol/L    Comment: Performed at [facility], [place] [address] [place],  [address]  Glucose, capillary     Status: Abnormal   Collection Time: 06/21/19  7:35 AM  Result Value Ref Range   Glucose-Capillary 65 (L) 70 - 99 mg/dL  Glucose, capillary     Status: None   Collection Time: 06/21/19  8:23 AM  Result Value Ref Range   Glucose-Capillary 82 70 - 99 mg/dL  Glucose, capillary     Status: Abnormal   Collection Time: 06/21/19 12:20 PM  Result Value Ref Range   Glucose-Capillary 65 (L) 70 - 99 mg/dL  Glucose, capillary     Status: None   Collection  Time: 06/21/19  4:06 PM  Result Value Ref Range   Glucose-Capillary 83 70 - 99 mg/dL  Glucose, capillary     Status: Abnormal   Collection Time: 06/21/19  8:22 PM  Result Value Ref Range   Glucose-Capillary 119 (H) 70 - 99 mg/dL   Comment 1 Notify RN    Comment 2 Document in Chart   Glucose, capillary  Status: None   Collection Time: 06/21/19 11:40 PM  Result Value Ref Range   Glucose-Capillary 89 70 - 99 mg/dL   Comment 1 Notify RN    Comment 2 Document in Chart   Glucose, capillary     Status: Abnormal   Collection Time: 06/22/19  3:47 AM  Result Value Ref Range   Glucose-Capillary 119 (H) 70 - 99 mg/dL   Comment 1 Notify RN    Comment 2 Document in Chart   CBC     Status: Abnormal   Collection Time: 06/22/19  5:25 AM  Result Value Ref Range   WBC 9.9 4.0 - 10.5 K/uL   RBC 2.63 (L) 3.87 - 5.11 MIL/uL   Hemoglobin 7.2 (L) 12.0 - 15.0 g/dL   HCT 23.3 (L) 36.0 - 46.0 %   MCV 88.6 80.0 - 100.0 fL   MCH 27.4 26.0 - 34.0 pg   MCHC 30.9 30.0 - 36.0 g/dL   RDW 20.0 (H) 11.5 - 15.5 %   Platelets 386 150 - 400 K/uL   nRBC 0.0 0.0 - 0.2 %    Comment: Performed at [facility], [place] [address], [place], [place] [address]  Comprehensive metabolic panel     Status: Abnormal   Collection Time: 06/22/19  5:25 AM  Result Value Ref Range   Sodium 136 135 - 145 mmol/L   Potassium 3.2 (L) 3.5 - 5.1 mmol/L   Chloride 108 98 - 111 mmol/L   CO2 20 (L) 22 - 32 mmol/L   Glucose, Bld 113 (H) 70 - 99 mg/dL   BUN 19 6 - 20 mg/dL   Creatinine, Ser 0.78 0.44 - 1.00 mg/dL   Calcium 8.0 (L) 8.9 - 10.3 mg/dL   Total Protein 5.2 (L) 6.5 - 8.1 g/dL   Albumin 2.1 (L) 3.5 - 5.0 g/dL   AST 15 15 - 41 U/L   ALT 18 0 - 44 U/L   Alkaline Phosphatase 82 38 - 126 U/L   Total Bilirubin 0.3 0.3 - 1.2 mg/dL   GFR calc non Af Amer >60 >60 mL/min   GFR calc Af Amer >60 >60 mL/min   Anion gap 8 5 - 15    Comment: Performed at [facility], [place] [address],  [place], [place] [address]  Glucose, capillary     Status: Abnormal   Collection Time: 06/22/19  8:00 AM  Result Value Ref Range   Glucose-Capillary 133 (H) 70 - 99 mg/dL   Comment 1 Notify RN    Comment 2 Document in Chart     Ct Abdomen Pelvis W Contrast  Result Date: 06/21/2019 CLINICAL DATA:  Abdominal pain, fever, metastatic breast cancer, peritoneal carcinoma EXAM: CT ABDOMEN AND PELVIS WITH CONTRAST TECHNIQUE: Multidetector CT imaging of the abdomen and pelvis was performed using the standard protocol following bolus administration of intravenous contrast. CONTRAST:  112mL OMNIPAQUE IOHEXOL 300 MG/ML SOLN, 29mL OMNIPAQUE IOHEXOL 300 MG/ML SOLN COMPARISON:  None. FINDINGS: Lower chest: Moderate right, small left pleural effusions with associated atelectasis or consolidation. No definite pulmonary nodules or pleural nodularity noted. Hepatobiliary: Numerous low-attenuation liver lesions of varying sizes, incompletely characterized and possibly representing cysts versus metastases. No gallstones, gallbladder wall thickening, or biliary dilatation. Pancreas: Unremarkable. No pancreatic ductal dilatation or surrounding inflammatory changes. Spleen: Normal in size without significant abnormality. Adrenals/Urinary Tract: Adrenal glands are unremarkable. Kidneys are normal, without renal calculi, solid lesion, or hydronephrosis. Foley catheter in the decompressed urinary bladder. Stomach/Bowel: The stomach is  grossly fluid distended. The small bowel is fluid distended, largest loops measuring 3.9 cm, with overt fecalization of the most distal small bowel and a tightly knuckled transition point in the central right lower quadrant (series 2, image 63). There is gas and stool within the colon to the rectum. Vascular/Lymphatic: No significant vascular findings are present. No enlarged abdominal or pelvic lymph nodes. Reproductive: Large uterine fibroid. Other: No abdominal wall hernia or abnormality. Moderate volume  ascites with diffuse peritoneal and omental nodularity, consistent with carcinomatosis. Musculoskeletal: Status post laminectomy of T11 and T12. IMPRESSION: 1. The stomach is grossly fluid distended. The small bowel is fluid distended, largest loops measuring 3.9 cm, with overt fecalization of the most distal small bowel and a tightly knuckled transition point in the central right lower quadrant (series 2, image 63). Findings are consistent with distal small bowel obstruction, which may be incomplete given the presence of gas and stool in the colon and rectum. 2. Moderate volume ascites with diffuse peritoneal and omental nodularity, consistent with carcinomatosis. 3. Numerous low-attenuation liver lesions of varying sizes, incompletely characterized and possibly representing cysts versus metastases. 4. Moderate right, small left pleural effusions with associated atelectasis or consolidation, presumably malignant. No definite pulmonary nodules or pleural nodularity noted. 5. Status post laminectomy of T11 and T12. 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 These results will be called to the ordering clinician or representative by the Radiologist Assistant, and communication documented in the PACS or zVision Dashboard. Electronically Signed   By: Eddie Candle M.D.   On: 06/21/2019 17:49   Dg Chest Port 1 View  Result Date: 06/20/2019 CLINICAL DATA:  53 year old female with history of metastatic breast cancer. Patient is febrile and confused. EXAM: PORTABLE CHEST 1 VIEW COMPARISON:  No priors. FINDINGS: Low lung volumes. Opacity at the right base which may reflect atelectasis and/or consolidation. Small to moderate right pleural effusion. Left lung is clear. No left pleural effusion. No evidence of pulmonary edema. Heart size is normal. Upper mediastinal contours are within normal limits. Right internal jugular single-lumen  porta cath with tip terminating in the mid superior vena cava. IMPRESSION: 1. Atelectasis and/or consolidation in the right lung base with small to moderate right pleural effusion. Electronically Signed   By: Vinnie Langton M.D.   On: 06/20/2019 20:28    Review of Systems  Constitutional: Positive for fever and malaise/fatigue.  Cardiovascular: Negative for chest pain.  Gastrointestinal: Positive for abdominal pain (Generalized fullness), nausea and vomiting.  All other systems reviewed and are negative.  Blood pressure 105/83, pulse (!) 141, temperature 98.9 F (37.2 C), temperature source Oral, resp. rate (!) 21, height 5\' 4"  (1.626 m), weight 63.5 kg, SpO2 98 %. Physical Exam  Constitutional: She is oriented to person, place, and time. She appears well-developed and well-nourished. No distress.  Cardiovascular: Normal rate, regular rhythm, normal heart sounds and intact distal pulses. Exam reveals no gallop and no friction rub.  No murmur heard. Respiratory: Effort normal and breath sounds normal. She has no wheezes. She has no rales.  GI: She exhibits distension. There is abdominal tenderness.  Neurological: She is alert and oriented to person, place, and time.  Skin: Skin is warm and dry.      Assessment/Plan:  Kaylee Randolph is a 53 yo female with a history of metastatic breast cancer, cauda equina syndrome with a 2 day history of nausea, vomiting and abdominal distension.  CT abdomen apprecitates distension of stomach and small bowel with fecalization in distal colon, consistent with SBO as well as nodularity within the peritoneal cavity concerning for carcinomatosis.  - Place NG tube  - NPO, Bowel rest  Suzanna Obey 06/22/2019, 10:35 AM

## 2019-06-22 NOTE — Progress Notes (Signed)
Pharmacy Antibiotic Note  Kaylee Randolph is a 53 y.o. female admitted on 06/20/2019 with fever and confusion.  She was recently treated with Cipro for E.coli UTI.  Pharmacy has been consulted for vancomycin and cefepime dosing for sepsis.  First doses of antibiotics have been ordered.   Plan: Vanc 1gm x1, then 1250mg  q24 for AUC 540, SCr 1.01 Cefepime 2gm q8  Height: 5\' 4"  (162.6 cm) Weight: 140 lb (63.5 kg) IBW/kg (Calculated) : 54.7  Temp (24hrs), Avg:100.1 F (37.8 C), Min:98.5 F (36.9 C), Max:101.7 F (38.7 C)  Recent Labs  Lab 06/20/19 2000 06/20/19 2142 06/21/19 0523 06/21/19 0528 06/22/19 0525  WBC 7.7  --  7.5  --  9.9  CREATININE 1.82*  --  1.01*  --  0.78  LATICACIDVEN 2.3* 2.4*  --  1.1  --     Estimated Creatinine Clearance: 70.2 mL/min (by C-G formula based on SCr of 0.78 mg/dL).    No Known Allergies  Antimicrobials this admission: Vanc 10/1 >> Cefepime 10/1 >> 10/1 Flagyl x1  Dose adjustments this admission: 10/2: Vanc 1gm q36h for AUC 486 using SCr 1.82  10/3: Cefepime 1gm q12 > q8  Microbiology results: 10/2 MRSA PCR: neg 10/1 UCx: ng-final 10/1 BCx: ngtd (one set R antecubital, one set PAC)  [name] PharmD Pager [phone] 06/22/2019, 9:21 AM

## 2019-06-22 NOTE — Progress Notes (Addendum)
[facility] for heparin  Indication: hx pulmonary embolus (holding xarelto)  No Known Allergies  Patient Measurements: Height: 5\' 4"  (162.6 cm) Weight: 140 lb (63.5 kg) IBW/kg (Calculated) : 54.7 Heparin Dosing Weight: 63 kg  Vital Signs: Temp: 98.9 F (37.2 C) (10/03 0400) Temp Source: Oral (10/03 0400) BP: 119/59 (10/03 1300) Pulse Rate: 137 (10/03 1100)  Labs: Recent Labs    06/20/19 2000 06/21/19 0523 06/22/19 0525  HGB 8.1* 7.4* 7.2*  HCT 25.6* 23.8* 23.3*  PLT 456* 372 386  APTT 62*  --   --   LABPROT 28.1*  --   --   INR 2.7*  --   --   CREATININE 1.82* 1.01* 0.78    Estimated Creatinine Clearance: 70.2 mL/min (by C-G formula based on SCr of 0.78 mg/dL).   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  Abd CT on 10/2 showed findings consistent with distal SBO.  CCS recommends NG tube for decompression and bowel rest.  Pharmacy was consulted on 10/3 to transition patient from [place] to heparin drip on 10/3.  Today, 06/22/2019: - hgb low ar 7.2, plts ok - no bleeding documented - xarelto 20 mg dose given on 10/3 at 1027   Goal of Therapy:  Heparin level 0.3-0.7 units/ml Monitor platelets by anticoagulation protocol: Yes   Plan:  - Since xarelto dose was given this morning, will start heparin drip at 1000 units/hr on 10/4 at 10 AM. - check 6 hr heparin level and aPTT after starting drip - check baseline aPTT and heparin level at 9 AM on 10/4 - monitor for s/s bleeding  Perrion Diesel P 06/22/2019,1:14 PM

## 2019-06-22 NOTE — Progress Notes (Signed)
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  Patient recently was diagnosed with E. coli UTI which was pansensitive and was treated with ciprofloxacin.  Patient started having fever was less reactive and more sleepy with confusion initially however mental status improved on arrival.  Patient does complain of abdominal pain.    Subjective   Patient seen and examined, CT scan abdomen obtained yesterday showed small bowel distended with overfecalizationof the most distal small bowel and a tightly knuckled transition point in the central right lower quadrant (series 2, image 63). Findings are consistent with distal small bowel obstruction, which may be incomplete given the presence of gas and stool in the colon and rectum.   Assessment/Plan:     1. Small bowel obstruction-seen on CT abdomen/pelvis.  We will keep patient n.p.o., consult general surgery.  2. Sepsis-patient presented with high fever, tachycardia, tachypnea, procalcitonin 3.20, lactic acid 1.1, WBC 9.9.  Initially started on vancomycin and cefepime for presumed sepsis.  Unclear source of infection.  We will continue with empiric antibiotics at this time.  Patient is clinically improved.  Blood culture is negative to date.  Urine culture showed no growth.  3. History of pulmonary embolism-patient was on Xarelto at home.  Currently she is n.p.o., will switch to IV heparin per pharmacy.  4. Acute kidney injury-patient presented with creatinine of 1.82, improved with IV fluids.  Today creatinine is  0.78.  5. 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  Patient has bilateral leg weakness right more than left.  Foley catheter has been placed.  6. 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  Patient is currently on chemotherapy at [facility].  Last dose was a week ago.  Palliative care also was consulted at [facility].  7. Anemia chronic disease-hemoglobin is 7.2 this morning.  Transfuse for hemoglobin less than 7.  8. Hypokalemia-potassium is still 3.2.  Will replace potassium and follow BMP in a.m.       CBG: Recent Labs  Lab 06/21/19 2022 06/21/19 2340 06/22/19 0347 06/22/19 0800 06/22/19 1150  GLUCAP 119* 89 119* 133* 125*    CBC: Recent Labs  Lab 06/20/19 2000 06/21/19 0523 06/22/19 0525  WBC 7.7 7.5 9.9  NEUTROABS 6.0  --   --   HGB 8.1* 7.4* 7.2*  HCT 25.6* 23.8* 23.3*  MCV 86.8 88.1 88.6  PLT 456* 372 [id number]    Basic Metabolic Panel: Recent Labs  Lab 06/20/19 2000 06/21/19 0523 06/22/19 0525  NA 127* 135 136  K 3.9 3.2* 3.2*  CL 89* 103 108  CO2 24 22 20*  GLUCOSE 101* 86 113*  BUN 21* 17 19  CREATININE 1.82* 1.01* 0.78  CALCIUM 8.7* 8.2* 8.0*     DVT prophylaxis: Xarelto, patient is n.p.o.  We will switch to Lovenox  Code Status: Full code  Family Communication: No family at bedside  Disposition Plan: likely home when medically ready for discharge Pressure Injury 06/20/19 Coccyx Mid Stage II -  Partial thickness loss of dermis presenting as a shallow open ulcer with a red, pink wound bed without slough. (Active)  06/20/19  2102  Location: Coccyx  Location Orientation: Mid  Staging: Stage II -  Partial thickness loss of dermis presenting as a shallow open ulcer with a red, pink wound bed without slough.  Wound Description (Comments):   Present on Admission: Yes        BMI  Estimated body mass index is 24.03 kg/m as calculated from the following:   Height as of this  encounter: 5\' 4"  (1.626 m).   Weight as of this encounter: 63.5 kg.  Scheduled medications:  . Chlorhexidine Gluconate Cloth  6 each Topical Daily  . gabapentin  100 mg Oral TID  . mirtazapine  7.5 mg Oral QHS  . rivaroxaban  20 mg Oral Daily  . sodium chloride flush  10-40 mL Intracatheter Q12H  . tiZANidine  4 mg Oral TID    Consultants:    Procedures:     Antibiotics:   Anti-infectives (From admission, onward)   Start     Dose/Rate Route Frequency Ordered Stop   06/22/19 1600  ceFEPIme (MAXIPIME) 2 g in sodium chloride 0.9 % 100 mL IVPB     2 g 200 mL/hr over 30 Minutes Intravenous Every 8 hours 06/22/19 0921     06/22/19 1000  vancomycin (VANCOCIN) IVPB 1000 mg/200 mL premix  Status:  Discontinued     1,000 mg 200 mL/hr over 60 Minutes Intravenous Every 36 hours 06/20/19 2048 06/20/19 2049   06/22/19 0600  vancomycin (VANCOCIN) IVPB 1000 mg/200 mL premix  Status:  Discontinued     1,000 mg 200 mL/hr over 60 Minutes Intravenous Every 36 hours 06/20/19 2049 06/21/19 1322   06/21/19 2100  vancomycin (VANCOCIN) 1,250 mg in sodium chloride 0.9 % 250 mL IVPB     1,250 mg 166.7 mL/hr over 90 Minutes Intravenous Every 24 hours 06/21/19 1322     06/21/19 0900  ceFEPIme (MAXIPIME) 2 g in sodium chloride 0.9 % 100 mL IVPB  Status:  Discontinued     2 g 200 mL/hr over 30 Minutes Intravenous Every 12 hours 06/20/19 2048 06/22/19 0921   06/20/19 2030  ceFEPIme (MAXIPIME) 2 g in sodium chloride 0.9 % 100 mL IVPB     2 g 200 mL/hr over 30 Minutes Intravenous  Once 06/20/19 2016 06/20/19 2134   06/20/19 2030  metroNIDAZOLE (FLAGYL) IVPB 500 mg     500 mg 100 mL/hr over 60 Minutes Intravenous  Once 06/20/19 2016 06/20/19 2249   06/20/19 2030  vancomycin (VANCOCIN) IVPB 1000 mg/200 mL premix     1,000 mg 200 mL/hr over 60 Minutes Intravenous  Once 06/20/19 2016 06/20/19 2143       Objective   Vitals:   06/22/19 0400 06/22/19 0500 06/22/19 0600 06/22/19 0700  BP: 102/76 (!)  98/53 (!) 89/49 105/83  Pulse:  (!) 145 (!) 139 (!) 141  Resp: 19 19 (!) 22 (!) 21  Temp: 98.9 F (37.2 C)     TempSrc: Oral     SpO2: 94% 92% 100% 98%  Weight:      Height:        Intake/Output Summary (Last 24 hours) at 06/22/2019 1249 Last data filed at 06/22/2019 0600 Gross per 24 hour  Intake 4993.08 ml  Output 1050 ml  Net 3943.08 ml   Filed Weights   06/20/19 2014  Weight: 63.5 kg     Physical Examination:    General: Appears in no acute distress  Cardiovascular: S1-S2, regular, no murmur auscultated  Respiratory: Clear to auscultation bilaterally  Abdomen:  Abdomen is soft, mild distention, mild tenderness to palpation  Extremities: No edema in the lower extremities  Neurologic: Cranial nerves II through XII grossly intact, motor strength 1/5 in right lower extremity, 3/5 in left lower extremity     Data Reviewed: I have personally reviewed following labs and imaging studies   Recent Results (from the past 240 hour(s))  Culture, blood (Routine x 2)     Status: None (Preliminary result)   Collection Time: 06/20/19  8:00 PM   Specimen: BLOOD  Result Value Ref Range Status   Specimen Description   Final    BLOOD CHEST PORTA CATH Performed at [facility], [address] [place], [place] [address]    Special Requests   Final    BOTTLES DRAWN AEROBIC AND ANAEROBIC Blood Culture adequate volume Performed at [facility], [address] [place], [place] [address]    Culture   Final    NO GROWTH 2 DAYS Performed at [facility], [place] [address] [place], [place] [address]    Report Status PENDING  Incomplete  Culture, blood (Routine x 2)     Status: None (Preliminary result)   Collection Time: 06/20/19  8:10 PM   Specimen: BLOOD  Result Value Ref Range Status   Specimen Description   Final    BLOOD RIGHT ANTECUBITAL Performed at [facility], [address] [place], [place] [address]    Special Requests    Final    BOTTLES DRAWN AEROBIC AND ANAEROBIC Blood Culture adequate volume Performed at [facility], [address] [place], [place] [address]    Culture   Final    NO GROWTH 2 DAYS Performed at [facility], [place] [address], [place], [place] [address]    Report Status PENDING  Incomplete  SARS Coronavirus 2 [facility] order, Performed in [facility] hospital lab) Nasopharyngeal Nasopharyngeal Swab     Status: None   Collection Time: 06/20/19  8:19 PM   Specimen: Nasopharyngeal Swab  Result Value Ref Range Status   SARS Coronavirus 2 NEGATIVE NEGATIVE Final    Comment: (NOTE) If result is NEGATIVE SARS-CoV-2 target nucleic acids are NOT DETECTED. The SARS-CoV-2 RNA is generally detectable in upper and lower  respiratory specimens during the acute phase of infection. The lowest  concentration of SARS-CoV-2 viral copies this assay can detect is 250  copies / mL. A negative result does not preclude SARS-CoV-2 infection  and should not be used as the sole basis for treatment or other  patient management decisions.  A negative result may occur with  improper specimen collection / handling, submission of specimen other  than nasopharyngeal swab, presence of viral mutation(s) within the  areas targeted by this assay, and inadequate number of viral copies  (<250 copies / mL). A negative result must be combined with clinical  observations, patient history, and epidemiological information. If result is POSITIVE SARS-CoV-2 target nucleic acids are DETECTED. The SARS-CoV-2 RNA is generally detectable in upper and lower  respiratory specimens dur ing the acute phase of infection.  Positive  results are indicative of active infection with SARS-CoV-2.  Clinical  correlation with patient history and other diagnostic information is  necessary to determine patient infection status.  Positive results do  not rule out bacterial infection or co-infection with other viruses. If result  is PRESUMPTIVE POSTIVE SARS-CoV-2 nucleic acids MAY BE PRESENT.   A presumptive positive result was  obtained on the submitted specimen  and confirmed on repeat testing.  While 2019 novel coronavirus  (SARS-CoV-2) nucleic acids may be present in the submitted sample  additional confirmatory testing may be necessary for epidemiological  and / or clinical management purposes  to differentiate between  SARS-CoV-2 and other Sarbecovirus currently known to infect humans.  If clinically indicated additional testing with an alternate test  methodology [phone]) is advised. The SARS-CoV-2 RNA is generally  detectable in upper and lower respiratory sp ecimens during the acute  phase of infection. The expected result is Negative. Fact Sheet for Patients:  StrictlyIdeas.no Fact Sheet for Healthcare Providers: BankingDealers.co.za This test is not yet approved or cleared by the [place] FDA and has been authorized for detection and/or diagnosis of SARS-CoV-2 by FDA under an Emergency Use Authorization (EUA).  This EUA will remain in effect (meaning this test can be used) for the duration of the COVID-19 declaration under Section 564(b)(1) of the Act, 21 U.S.C. section 360bbb-3(b)(1), unless the authorization is terminated or revoked sooner. Performed at [facility], [address] [place], [place] [address]   Urine culture     Status: None   Collection Time: 06/20/19  8:32 PM   Specimen: In/Out Cath Urine  Result Value Ref Range Status   Specimen Description   Final    IN/OUT CATH URINE Performed at [facility], [address] [place], [place] [address]    Special Requests   Final    NONE Performed at [facility], [address] [place], [place] [address]    Culture   Final    NO GROWTH Performed at [facility], [place] [address], [place], [place] [address]    Report Status 06/21/2019 FINAL   Final  MRSA PCR Screening     Status: None   Collection Time: 06/21/19  3:53 AM   Specimen: Nasal Mucosa; Nasopharyngeal  Result Value Ref Range Status   MRSA by PCR NEGATIVE NEGATIVE Final    Comment:        The GeneXpert MRSA Assay (FDA approved for NASAL specimens only), is one component of a comprehensive MRSA colonization surveillance program. It is not intended to diagnose MRSA infection nor to guide or monitor treatment for MRSA infections. Performed at [facility], [place] [address] [place], [place] [address]      Liver Function Tests: Recent Labs  Lab 06/20/19 2000 06/22/19 0525  AST 20 15  ALT 24 18  ALKPHOS 89 82  BILITOT 0.6 0.3  PROT 5.7* 5.2*  ALBUMIN 2.6* 2.1*   No results for input(s): LIPASE, AMYLASE in the last 168 hours. No results for input(s): AMMONIA in the last 168 hours.  Cardiac Enzymes: No results for input(s): CKTOTAL, CKMB, CKMBINDEX, TROPONINI in the last 168 hours. BNP (last 3 results) No results for input(s): BNP in the last 8760 hours.  ProBNP (last 3 results) No results for input(s): PROBNP in the last 8760 hours.    Studies: Ct Abdomen Pelvis W Contrast  Result Date: 06/21/2019 CLINICAL DATA:  Abdominal pain, fever, metastatic breast cancer, peritoneal carcinoma EXAM: CT ABDOMEN AND PELVIS WITH CONTRAST TECHNIQUE: Multidetector CT imaging of the abdomen and pelvis was performed using the standard protocol following bolus administration of intravenous contrast. CONTRAST:  156mL OMNIPAQUE IOHEXOL 300 MG/ML SOLN, 36mL OMNIPAQUE IOHEXOL 300 MG/ML SOLN COMPARISON:  None. FINDINGS: Lower chest: Moderate right, small left pleural effusions with associated atelectasis or  consolidation. No definite pulmonary nodules or pleural nodularity noted. Hepatobiliary: Numerous low-attenuation liver lesions of varying sizes, incompletely characterized and possibly representing cysts versus metastases. No gallstones, gallbladder wall  thickening, or biliary dilatation. Pancreas: Unremarkable. No pancreatic ductal dilatation or surrounding inflammatory changes. Spleen: Normal in size without significant abnormality. Adrenals/Urinary Tract: Adrenal glands are unremarkable. Kidneys are normal, without renal calculi, solid lesion, or hydronephrosis. Foley catheter in the decompressed urinary bladder. Stomach/Bowel: The stomach is grossly fluid distended. The small bowel is fluid distended, largest loops measuring 3.9 cm, with overt fecalization of the most distal small bowel and a tightly knuckled transition point in the central right lower quadrant (series 2, image 63). There is gas and stool within the colon to the rectum. Vascular/Lymphatic: No significant vascular findings are present. No enlarged abdominal or pelvic lymph nodes. Reproductive: Large uterine fibroid. Other: No abdominal wall hernia or abnormality. Moderate volume ascites with diffuse peritoneal and omental nodularity, consistent with carcinomatosis. Musculoskeletal: Status post laminectomy of T11 and T12. IMPRESSION: 1. The stomach is grossly fluid distended. The small bowel is fluid distended, largest loops measuring 3.9 cm, with overt fecalization of the most distal small bowel and a tightly knuckled transition point in the central right lower quadrant (series 2, image 63). Findings are consistent with distal small bowel obstruction, which may be incomplete given the presence of gas and stool in the colon and rectum. 2. Moderate volume ascites with diffuse peritoneal and omental nodularity, consistent with carcinomatosis. 3. Numerous low-attenuation liver lesions of varying sizes, incompletely characterized and possibly representing cysts versus metastases. 4. Moderate right, small left pleural effusions with associated atelectasis or consolidation, presumably malignant. No definite pulmonary nodules or pleural nodularity noted. 5. Status post laminectomy of T11 and T12.  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 These results will be called to the ordering clinician or representative by the Radiologist Assistant, and communication documented in the PACS or zVision Dashboard. Electronically Signed   By: Eddie Candle M.D.   On: 06/21/2019 17:49   Dg Chest Port 1 View  Result Date: 06/20/2019 CLINICAL DATA:  53 year old female with history of metastatic breast cancer. Patient is febrile and confused. EXAM: PORTABLE CHEST 1 VIEW COMPARISON:  No priors. FINDINGS: Low lung volumes. Opacity at the right base which may reflect atelectasis and/or consolidation. Small to moderate right pleural effusion. Left lung is clear. No left pleural effusion. No evidence of pulmonary edema. Heart size is normal. Upper mediastinal contours are within normal limits. Right internal jugular single-lumen porta cath with tip terminating in the mid superior vena cava. IMPRESSION: 1. Atelectasis and/or consolidation in the right lung base with small to moderate right pleural effusion. Electronically Signed   By: Vinnie Langton M.D.   On: 06/20/2019 20:28     Admission status: Inpatient: Based on patients clinical presentation and evaluation of above clinical data, I have made determination that patient meets Inpatient criteria at this time.  Time spent: 20 minutes  [facility] Pager [phone]. If 7PM-7AM, please contact night-coverage at [email or web address], Office  [phone]  password [email or web address]  06/22/2019, 12:49 PM  LOS: 2 days

## 2019-06-22 NOTE — Progress Notes (Signed)
Pt has had low blood pressure tonight necessitating two one liter boluses. Her heart rate remains 140. She has been afebrile tonight. Her temp foley appears to be inaccurate. It was reading high but pt did not feel warm or feel uncomfortable. Previous shift reported not getting acurate readings from temp foley. When she complains of pain she states it is her same pain she has at home, 10/10 and coming down to an 8/10 with Dilaudid 1 mg. I called for one extra dose but her BP is soft, will give med and monitor BP.  I have given her Zofran and Reglan for vomiting small amt of brown bile twice tonight. Talked to her about probably needing an NG tube, she states she has had one before. Will give Dilaudid and monitor VS.  Hoyle Barr, RN

## 2019-06-23 ENCOUNTER — Encounter (HOSPITAL_COMMUNITY): Payer: Self-pay | Admitting: *Deleted

## 2019-06-23 ENCOUNTER — Inpatient Hospital Stay (HOSPITAL_COMMUNITY): Payer: BC Managed Care – PPO

## 2019-06-23 LAB — PREPARE RBC (CROSSMATCH)

## 2019-06-23 LAB — CBC
HCT: 21.8 % — ABNORMAL LOW (ref 36.0–46.0)
Hemoglobin: 6.8 g/dL — CL (ref 12.0–15.0)
MCH: 27.4 pg (ref 26.0–34.0)
MCHC: 31.2 g/dL (ref 30.0–36.0)
MCV: 87.9 fL (ref 80.0–100.0)
Platelets: 351 10*3/uL (ref 150–400)
RBC: 2.48 MIL/uL — ABNORMAL LOW (ref 3.87–5.11)
RDW: 20.1 % — ABNORMAL HIGH (ref 11.5–15.5)
WBC: 7.7 10*3/uL (ref 4.0–10.5)
nRBC: 0 % (ref 0.0–0.2)

## 2019-06-23 LAB — BASIC METABOLIC PANEL
Anion gap: 4 — ABNORMAL LOW (ref 5–15)
BUN: 18 mg/dL (ref 6–20)
CO2: 21 mmol/L — ABNORMAL LOW (ref 22–32)
Calcium: 8 mg/dL — ABNORMAL LOW (ref 8.9–10.3)
Chloride: 114 mmol/L — ABNORMAL HIGH (ref 98–111)
Creatinine, Ser: 0.59 mg/dL (ref 0.44–1.00)
GFR calc Af Amer: >60 mL/min (ref >60)
GFR calc non Af Amer: >60 mL/min (ref >60)
Glucose, Bld: 135 mg/dL — ABNORMAL HIGH (ref 70–99)
Potassium: 3.1 mmol/L — ABNORMAL LOW (ref 3.5–5.1)
Sodium: 139 mmol/L (ref 135–145)

## 2019-06-23 LAB — GLUCOSE, CAPILLARY
Glucose-Capillary: 118 mg/dL — ABNORMAL HIGH (ref 70–99)
Glucose-Capillary: 102 mg/dL — ABNORMAL HIGH (ref 70–99)
Glucose-Capillary: 93 mg/dL (ref 70–99)
Glucose-Capillary: 121 mg/dL — ABNORMAL HIGH (ref 70–99)
Glucose-Capillary: 132 mg/dL — ABNORMAL HIGH (ref 70–99)

## 2019-06-23 LAB — APTT
aPTT: 50 seconds — ABNORMAL HIGH (ref 24–36)
aPTT: 86 seconds — ABNORMAL HIGH (ref 24–36)

## 2019-06-23 LAB — MAGNESIUM: Magnesium: 1.8 mg/dL (ref 1.7–2.4)

## 2019-06-23 LAB — HEPARIN LEVEL (UNFRACTIONATED)
Heparin Unfractionated: 1.43 IU/mL — ABNORMAL HIGH (ref 0.30–0.70)
Heparin Unfractionated: 1.15 IU/mL — ABNORMAL HIGH (ref 0.30–0.70)

## 2019-06-23 LAB — PHOSPHORUS: Phosphorus: 2 mg/dL — ABNORMAL LOW (ref 2.5–4.6)

## 2019-06-23 MED ORDER — ORAL CARE MOUTH RINSE
15.0000 mL | Freq: Two times a day (BID) | OROMUCOSAL | Status: DC
  Administered 2019-06-24 – 2019-07-05 (×13): 15 mL via OROMUCOSAL

## 2019-06-23 MED ORDER — LORAZEPAM 2 MG/ML IJ SOLN: 2.0000 mg | INTRAMUSCULAR | Status: DC | PRN

## 2019-06-23 MED ORDER — LORAZEPAM 2 MG/ML IJ SOLN: 0.5000 mg | Freq: Four times a day (QID) | INTRAMUSCULAR | Status: DC | PRN

## 2019-06-23 MED ORDER — CHLORHEXIDINE GLUCONATE 0.12 % MT SOLN
15.0000 mL | Freq: Two times a day (BID) | OROMUCOSAL | Status: DC
  Administered 2019-06-23 – 2019-07-05 (×22): 15 mL via OROMUCOSAL
  Filled 2019-06-23 (×20): qty 15

## 2019-06-23 MED ORDER — LORAZEPAM 2 MG/ML IJ SOLN
1.0000 mg | Freq: Once | INTRAMUSCULAR | Status: AC
  Administered 2019-06-23: 1 mg via INTRAVENOUS
  Filled 2019-06-23: qty 1

## 2019-06-23 MED ORDER — POTASSIUM CHLORIDE 10 MEQ/100ML IV SOLN
10.0000 meq | INTRAVENOUS | Status: AC
  Administered 2019-06-23 (×4): 10 meq via INTRAVENOUS
  Filled 2019-06-23 (×4): qty 100

## 2019-06-23 MED ORDER — SODIUM CHLORIDE 0.9% IV SOLUTION
Freq: Once | INTRAVENOUS | Status: AC
  Administered 2019-06-23: 09:00:00 via INTRAVENOUS

## 2019-06-23 MED ORDER — VANCOMYCIN HCL IN DEXTROSE 750-5 MG/150ML-% IV SOLN
750.0000 mg | Freq: Two times a day (BID) | INTRAVENOUS | Status: DC
  Administered 2019-06-23 – 2019-06-25 (×5): 750 mg via INTRAVENOUS
  Filled 2019-06-23 (×5): qty 150

## 2019-06-23 MED ORDER — LORAZEPAM 2 MG/ML IJ SOLN
1.0000 mg | INTRAMUSCULAR | Status: DC | PRN
  Administered 2019-06-23 – 2019-06-25 (×2): 1 mg via INTRAVENOUS
  Filled 2019-06-23 (×2): qty 1

## 2019-06-23 NOTE — Progress Notes (Signed)
Patient was praying and holding hands up towards ceiling, then she got loader and louder. I went in the room and attempted to calmer but she wasn't scared as I had thought. She got louder and louder until she was scream at "Jesus to take the devil away" this lasted about 3 minutes. She never lost consciousness but she would not respond to me. Once every nurse on the unit was in front of her door she began singing and then she became quite just as she had been previously. I contacted the doctor for prn Ativan if this continued but she calmed and watched TV. She is denying pain.

## 2019-06-23 NOTE — Progress Notes (Signed)
Husband called again and stated the patient needed a special pillow that she sleeps with and he would bring to ED. He was concerned that his children stated she was sending aggressive texted that were now "like Her" he wanted to be sure she wasn't giving [place] a hard time. I assured him she had been grateful for everything we did and was calm and cooperative.

## 2019-06-23 NOTE — Progress Notes (Signed)
Pharmacy Antibiotic Note  Kaylee Randolph is a 53 y.o. female admitted on 06/20/2019 with fever and confusion.  She was recently treated with Cipro for E.coli UTI.  Pharmacy has been consulted for vancomycin and cefepime dosing for sepsis.  First doses of antibiotics have been ordered.   Plan: Vanc 1gm x1, then 750mg  q12, AUC 523, rounding SCr to 0.8 Cefepime 2gm q8 MD aware MRSA PCR neg  Height: 5\' 4"  (162.6 cm) Weight: 140 lb (63.5 kg) IBW/kg (Calculated) : 54.7  Temp (24hrs), Avg:100.4 F (38 C), Min:99.5 F (37.5 C), Max:100.8 F (38.2 C)  Recent Labs  Lab 06/20/19 2000 06/20/19 2142 06/21/19 0523 06/21/19 0528 06/22/19 0525 06/23/19 0508  WBC 7.7  --  7.5  --  9.9 7.7  CREATININE 1.82*  --  1.01*  --  0.78 0.59  LATICACIDVEN 2.3* 2.4*  --  1.1  --   --     Estimated Creatinine Clearance: 70.2 mL/min (by C-G formula based on SCr of 0.59 mg/dL).    No Known Allergies  Antimicrobials this admission: Vanc 10/1 >> Cefepime 10/1 >> 10/1 Flagyl x1  Dose adjustments this admission: 10/2: Vanc 1gm q36h for AUC 486 using SCr 1.82  10/3: Cefepime 1gm q12 > q8 10/4: Vanc 1250mg  q24 for AUC 540, SCr 1.01 > SCr decreased further  Microbiology results: 10/2 MRSA PCR: neg 10/1 UCx: ng-final 10/1 BCx: ngtd   [name] PharmD Pager [phone] 06/23/2019, 10:22 AM

## 2019-06-23 NOTE — Progress Notes (Signed)
[place] for heparin  Indication: hx pulmonary embolus (holding xarelto)  No Known Allergies  Patient Measurements: Height: 5\' 4"  (162.6 cm) Weight: 140 lb (63.5 kg) IBW/kg (Calculated) : 54.7 Heparin Dosing Weight: 63 kg  Vital Signs: Temp: 100 F (37.8 C) (10/04 1508) Temp Source: Bladder (10/04 1125) BP: 158/94 (10/04 1508) Pulse Rate: 134 (10/04 1508)  Labs: Recent Labs    06/20/19 2000 06/21/19 0523 06/22/19 0525 06/23/19 0508 06/23/19 0850 06/23/19 1520  HGB 8.1* 7.4* 7.2* 6.8*  --   --   HCT 25.6* 23.8* 23.3* 21.8*  --   --   PLT 456* 372 386 351  --   --   APTT 62*  --   --   --  50* 86*  LABPROT 28.1*  --   --   --   --   --   INR 2.7*  --   --   --   --   --   HEPARINUNFRC  --   --   --   --  1.43*  --   CREATININE 1.82* 1.01* 0.78 0.59  --   --     Estimated Creatinine Clearance: 70.2 mL/min (by C-G formula based on SCr of 0.59 mg/dL).   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 Abd CT on 10/2 showed findings consistent with distal SBO. CCS recommends NG tube for decompression and bowel rest.  Pharmacy was consulted on 10/3 to transition patient from [place] to heparin drip on 10/3.  Today, 06/23/2019:  CBC: Hgb now < 7.0, getting transfused 1 unit PRBC - per RN no signs of bleeding and FOBT not ordered; Plt stable WNL  Most recent aPTT therapeutic on 1000 units/hr  Heparin level elevated as expected with recent DOAC  Goal of Therapy:  Heparin level 0.3-0.7 units/ml  APTT 66-102 sec Monitor platelets by anticoagulation protocol: Yes   Plan:   Continue IV heparin at 1000 units/hr  Daily heparin level and CBC; will defer confirmatory aPTT to AM labs as well  Monitor for signs of bleeding or thrombosis  F/u ability to resume [place], PharmD, BCPS [phone] 06/23/2019, 4:28 PM

## 2019-06-23 NOTE — Progress Notes (Signed)
Subjective/Chief Complaint: Pt can't tell if she is having flatus or not.  No n/v.  BM recorded yesterday.     Objective: Vital signs in last 24 hours: Temp:  [99.5 F (37.5 C)-99.7 F (37.6 C)] 99.7 F (37.6 C) (10/03 1600) Pulse Rate:  [67-144] 135 (10/04 0600) Resp:  [17-40] 26 (10/04 0600) BP: (93-162)/(59-137) 142/91 (10/04 0600) SpO2:  [92 %-100 %] 98 % (10/04 0600) Last BM Date: 06/22/19  Intake/Output from previous day: 10/03 0701 - 10/04 0700 In: 3485.6 [I.V.:2857.8; NG/GT:30; IV Piggyback:537.8] Out: 2835 [Urine:1015; Emesis/NG output:1820] Intake/Output this shift: No intake/output data recorded.  General appearance: alert, cooperative, mild distress and staring into space, but answers questions. Resp: breathing comfortably Cardio: regular, tachycardic GI: soft, distended, non tender. Extremities: extremities normal, atraumatic, no cyanosis or edema  Lab Results:  Recent Labs    06/22/19 0525 06/23/19 0508  WBC 9.9 7.7  HGB 7.2* 6.8*  HCT 23.3* 21.8*  PLT 386 351   BMET Recent Labs    06/22/19 0525 06/23/19 0508  NA 136 139  K 3.2* 3.1*  CL 108 114*  CO2 20* 21*  GLUCOSE 113* 135*  BUN 19 18  CREATININE 0.78 0.59  CALCIUM 8.0* 8.0*   PT/INR Recent Labs    06/20/19 2000  LABPROT 28.1*  INR 2.7*   ABG No results for input(s): PHART, HCO3 in the last 72 hours.  Invalid input(s): PCO2, PO2  Studies/Results: Dg Abd 1 View  Result Date: 06/22/2019 CLINICAL DATA:  NG tube placement EXAM: ABDOMEN - 1 VIEW COMPARISON:  None. FINDINGS: Enteric tube looped in the stomach with its tip in the distal gastric body. Nonobstructive bowel gas pattern. Visualized osseous structures are within normal limits. IMPRESSION: Enteric tube with its tip in the distal gastric body. Electronically Signed   By: Julian Hy M.D.   On: 06/22/2019 13:43   Ct Abdomen Pelvis W Contrast  Result Date: 06/21/2019 CLINICAL DATA:  Abdominal pain, fever,  metastatic breast cancer, peritoneal carcinoma EXAM: CT ABDOMEN AND PELVIS WITH CONTRAST TECHNIQUE: Multidetector CT imaging of the abdomen and pelvis was performed using the standard protocol following bolus administration of intravenous contrast. CONTRAST:  137mL OMNIPAQUE IOHEXOL 300 MG/ML SOLN, 56mL OMNIPAQUE IOHEXOL 300 MG/ML SOLN COMPARISON:  None. FINDINGS: Lower chest: Moderate right, small left pleural effusions with associated atelectasis or consolidation. No definite pulmonary nodules or pleural nodularity noted. Hepatobiliary: Numerous low-attenuation liver lesions of varying sizes, incompletely characterized and possibly representing cysts versus metastases. No gallstones, gallbladder wall thickening, or biliary dilatation. Pancreas: Unremarkable. No pancreatic ductal dilatation or surrounding inflammatory changes. Spleen: Normal in size without significant abnormality. Adrenals/Urinary Tract: Adrenal glands are unremarkable. Kidneys are normal, without renal calculi, solid lesion, or hydronephrosis. Foley catheter in the decompressed urinary bladder. Stomach/Bowel: The stomach is grossly fluid distended. The small bowel is fluid distended, largest loops measuring 3.9 cm, with overt fecalization of the most distal small bowel and a tightly knuckled transition point in the central right lower quadrant (series 2, image 63). There is gas and stool within the colon to the rectum. Vascular/Lymphatic: No significant vascular findings are present. No enlarged abdominal or pelvic lymph nodes. Reproductive: Large uterine fibroid. Other: No abdominal wall hernia or abnormality. Moderate volume ascites with diffuse peritoneal and omental nodularity, consistent with carcinomatosis. Musculoskeletal: Status post laminectomy of T11 and T12. IMPRESSION: 1. The stomach is grossly fluid distended. The small bowel is fluid distended, largest loops measuring 3.9 cm, with overt fecalization of the most distal  small bowel  and a tightly knuckled transition point in the central right lower quadrant (series 2, image 63). Findings are consistent with distal small bowel obstruction, which may be incomplete given the presence of gas and stool in the colon and rectum. 2. Moderate volume ascites with diffuse peritoneal and omental nodularity, consistent with carcinomatosis. 3. Numerous low-attenuation liver lesions of varying sizes, incompletely characterized and possibly representing cysts versus metastases. 4. Moderate right, small left pleural effusions with associated atelectasis or consolidation, presumably malignant. No definite pulmonary nodules or pleural nodularity noted. 5. Status post laminectomy of T11 and T12. 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 These results will be called to the ordering clinician or representative by the Radiologist Assistant, and communication documented in the PACS or zVision Dashboard. Electronically Signed   By: Eddie Candle M.D.   On: 06/21/2019 17:49    Anti-infectives: Anti-infectives (From admission, onward)   Start     Dose/Rate Route Frequency Ordered Stop   06/22/19 1600  ceFEPIme (MAXIPIME) 2 g in sodium chloride 0.9 % 100 mL IVPB     2 g 200 mL/hr over 30 Minutes Intravenous Every 8 hours 06/22/19 0921     06/22/19 1000  vancomycin (VANCOCIN) IVPB 1000 mg/200 mL premix  Status:  Discontinued     1,000 mg 200 mL/hr over 60 Minutes Intravenous Every 36 hours 06/20/19 2048 06/20/19 2049   06/22/19 0600  vancomycin (VANCOCIN) IVPB 1000 mg/200 mL premix  Status:  Discontinued     1,000 mg 200 mL/hr over 60 Minutes Intravenous Every 36 hours 06/20/19 2049 06/21/19 1322   06/21/19 2100  vancomycin (VANCOCIN) 1,250 mg in sodium chloride 0.9 % 250 mL IVPB     1,250 mg 166.7 mL/hr over 90 Minutes Intravenous Every 24 hours 06/21/19 1322     06/21/19 0900  ceFEPIme (MAXIPIME) 2 g in sodium  chloride 0.9 % 100 mL IVPB  Status:  Discontinued     2 g 200 mL/hr over 30 Minutes Intravenous Every 12 hours 06/20/19 2048 06/22/19 0921   06/20/19 2030  ceFEPIme (MAXIPIME) 2 g in sodium chloride 0.9 % 100 mL IVPB     2 g 200 mL/hr over 30 Minutes Intravenous  Once 06/20/19 2016 06/20/19 2134   06/20/19 2030  metroNIDAZOLE (FLAGYL) IVPB 500 mg     500 mg 100 mL/hr over 60 Minutes Intravenous  Once 06/20/19 2016 06/20/19 2249   06/20/19 2030  vancomycin (VANCOCIN) IVPB 1000 mg/200 mL premix     1,000 mg 200 mL/hr over 60 Minutes Intravenous  Once 06/20/19 2016 06/20/19 2143      Assessment/Plan: 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   NGT working well with 1800 out since placement  BM recorded may be residual stool and not reflective of her opening up.  Continue NGT, NPO.  Will need goals of care meeting.  Sometimes carcinomatosis like this can reflect a frozen abdomen that is high risk for fistulization with ex lap.  However, she may have less extensive disease.    Surgery to follow.  Would give her another day or two to open up.  Will check prealbumin.  Consider starting TNA tomorrow if not opening up.            LOS: 3 days    Stark Klein 06/23/2019

## 2019-06-23 NOTE — Progress Notes (Signed)
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  Patient recently was diagnosed with E. coli UTI which was pansensitive and was treated with ciprofloxacin.  Patient started having fever was less reactive and more sleepy with confusion initially however mental status improved on arrival.  Patient does complain of abdominal pain.    Subjective   Patient seen and examined, got agitated last night.  Pulled out NG tube twice.  At this time patient is calm, answering questions appropriately.   Assessment/Plan:    1. Small bowel obstruction/peritoneal carcinomatosis-seen on CT abdomen/pelvis due to metastatic disease from breast cancer.  We will keep patient n.p.o., NG tube was inserted.  General surgery following.  2. Sepsis-patient presented with high fever, tachycardia, tachypnea, procalcitonin 3.20, lactic acid 1.1, WBC 9.9.  Initially started on vancomycin and cefepime for presumed sepsis.  Unclear source of infection.  We will continue with empiric antibiotics at this time.  Patient is clinically improved.  Blood culture is negative to date.  Urine culture showed no growth.  3. History of pulmonary embolism-patient was on Xarelto at home.  Currently she is n.p.o., continue IV heparin.  4. Delirium-patient has developed delirium, with fluctuating mental status.  Will obtain CT head to rule out underlying neurological abnormality.  Ativan did calm her down.  Will start Ativan 1 mg IV every 4 hours as needed  5. Acute kidney injury-patient presented with creatinine of 1.82,  improved with IV fluids.  Today creatinine is 0.78.  6. 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  Patient has bilateral leg weakness right more than left.  Foley catheter has been placed.  7. 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  Patient is currently on chemotherapy at [facility].  Last dose was a week ago.  Palliative care also was consulted at [facility]  8. Anemia chronic disease-hemoglobin is 7.2 this morning.  Transfuse for hemoglobin less than 7.  9. Hypokalemia-potassium is still 3.1.  Will replace potassium and follow BMP in a.m.  10. Goals of care discussion-I discussed with patient's husband, who wants patient to be full code.  If patient does not improve in the next 1 to 2 days will consider palliative care consultation for formal goals of care discussion.       CBG: Recent Labs  Lab 06/22/19 1150 06/22/19 1626 06/22/19 1940 06/22/19 2329 06/23/19 0731  GLUCAP 125* 119* 113* 118* 118*    CBC: Recent Labs  Lab 06/20/19 2000 06/21/19 0523 06/22/19 0525 06/23/19 0508  WBC 7.7 7.5 9.9 7.7  NEUTROABS 6.0  --   --   --   HGB 8.1* 7.4* 7.2* 6.8*  HCT 25.6* 23.8* 23.3* 21.8*  MCV 86.8 88.1 88.6 87.9  PLT 456* 372 386 [id number]    Basic Metabolic Panel: Recent Labs  Lab 06/20/19 2000 06/21/19 0523 06/22/19 0525 06/23/19 0508  NA 127* 135 136 139  K 3.9 3.2* 3.2* 3.1*  CL 89* 103 108 114*  CO2 24 22 20* 21*  GLUCOSE 101* 86 113* 135*  BUN 21* 17 19 18   CREATININE 1.82* 1.01* 0.78 0.59  CALCIUM 8.7*  8.2* 8.0* 8.0*  MG  --   --   --  1.8  PHOS  --   --   --  2.0*     DVT prophylaxis: Xarelto, patient is n.p.o.  We will switch to Lovenox  Code Status: Full code  Family Communication: No family at bedside  Disposition Plan: likely home when medically ready for discharge Pressure Injury 06/20/19 Coccyx Mid Stage II -  Partial thickness loss of dermis presenting as a shallow open ulcer with a  red, pink wound bed without slough. (Active)  06/20/19 2102  Location: Coccyx  Location Orientation: Mid  Staging: Stage II -  Partial thickness loss of dermis presenting as a shallow open ulcer with a red, pink wound bed without slough.  Wound Description (Comments):   Present on Admission: Yes      BMI  Estimated body mass index is 24.03 kg/m as calculated from the following:   Height as of this encounter: 5\' 4"  (1.626 m).   Weight as of this encounter: 63.5 kg.  Scheduled medications:  . Chlorhexidine Gluconate Cloth  6 each Topical Daily  . gabapentin  100 mg Oral TID  . mirtazapine  7.5 mg Oral QHS  . sodium chloride flush  10-40 mL Intracatheter Q12H  . tiZANidine  4 mg Oral TID    Consultants:    Procedures:     Antibiotics:   Anti-infectives (From admission, onward)   Start     Dose/Rate Route Frequency Ordered Stop   06/23/19 1100  vancomycin (VANCOCIN) IVPB 750 mg/150 ml premix     750 mg 150 mL/hr over 60 Minutes Intravenous Every 12 hours 06/23/19 1025     06/22/19 1600  ceFEPIme (MAXIPIME) 2 g in sodium chloride 0.9 % 100 mL IVPB     2 g 200 mL/hr over 30 Minutes Intravenous Every 8 hours 06/22/19 0921     06/22/19 1000  vancomycin (VANCOCIN) IVPB 1000 mg/200 mL premix  Status:  Discontinued     1,000 mg 200 mL/hr over 60 Minutes Intravenous Every 36 hours 06/20/19 2048 06/20/19 2049   06/22/19 0600  vancomycin (VANCOCIN) IVPB 1000 mg/200 mL premix  Status:  Discontinued     1,000 mg 200 mL/hr over 60 Minutes Intravenous Every 36 hours 06/20/19 2049 06/21/19 1322   06/21/19 2100  vancomycin (VANCOCIN) 1,250 mg in sodium chloride 0.9 % 250 mL IVPB  Status:  Discontinued     1,250 mg 166.7 mL/hr over 90 Minutes Intravenous Every 24 hours 06/21/19 1322 06/23/19 1025   06/21/19 0900  ceFEPIme (MAXIPIME) 2 g in sodium chloride 0.9 % 100 mL IVPB  Status:  Discontinued     2 g 200 mL/hr over 30 Minutes Intravenous Every 12 hours 06/20/19 2048 06/22/19  0921   06/20/19 2030  ceFEPIme (MAXIPIME) 2 g in sodium chloride 0.9 % 100 mL IVPB     2 g 200 mL/hr over 30 Minutes Intravenous  Once 06/20/19 2016 06/20/19 2134   06/20/19 2030  metroNIDAZOLE (FLAGYL) IVPB 500 mg     500 mg 100 mL/hr over 60 Minutes Intravenous  Once 06/20/19 2016 06/20/19 2249   06/20/19 2030  vancomycin (VANCOCIN) IVPB 1000 mg/200 mL premix     1,000 mg 200 mL/hr over 60 Minutes Intravenous  Once 06/20/19 2016 06/20/19 2143       Objective   Vitals:   06/23/19 0930 06/23/19 1003 06/23/19 1022 06/23/19 1028  BP: (!) 151/91 (!) 159/98  (!) 162/107  Pulse: Marland Kitchen)  139   (!) 135  Resp: (!) 21 18 (!) 23 (!) 26  Temp: (!) 100.6 F (38.1 C) 100.2 F (37.9 C) 100.2 F (37.9 C) 100 F (37.8 C)  TempSrc: Bladder     SpO2: 97% 97%  99%  Weight:      Height:        Intake/Output Summary (Last 24 hours) at 06/23/2019 1106 Last data filed at 06/23/2019 0600 Gross per 24 hour  Intake 2860.59 ml  Output 2735 ml  Net 125.59 ml   Filed Weights   06/20/19 2014  Weight: 63.5 kg     Physical Examination:  General-appears in no acute distress Heart-S1-S2, regular, no murmur auscultated Lungs-clear to auscultation bilaterally, no wheezing or crackles auscultated Abdomen-soft, nontender, no organomegaly Extremities-no edema in the lower extremities, moving all extremities Neuro-alert, oriented x2,    Data Reviewed: I have personally reviewed following labs and imaging studies   Recent Results (from the past 240 hour(s))  Culture, blood (Routine x 2)     Status: None (Preliminary result)   Collection Time: 06/20/19  8:00 PM   Specimen: BLOOD  Result Value Ref Range Status   Specimen Description   Final    BLOOD CHEST PORTA CATH Performed at [facility], [address] [place], [place] [address]    Special Requests   Final    BOTTLES DRAWN AEROBIC AND ANAEROBIC Blood Culture adequate volume Performed at [facility], [address] [place], [place] [address]    Culture   Final    NO GROWTH 3 DAYS Performed at [facility], [place] [address] [place], [place] [address]    Report Status PENDING  Incomplete  Culture, blood (Routine x 2)     Status: None (Preliminary result)   Collection Time: 06/20/19  8:10 PM   Specimen: BLOOD  Result Value Ref Range Status   Specimen Description   Final    BLOOD RIGHT ANTECUBITAL Performed at [facility], [address] [place], [place] [address]    Special Requests   Final    BOTTLES DRAWN AEROBIC AND ANAEROBIC Blood Culture adequate volume Performed at [facility], [address] [place], [place] [address]    Culture   Final    NO GROWTH 3 DAYS Performed at [facility], [place] [address] [place], [place] [address]    Report Status PENDING  Incomplete  SARS Coronavirus 2 [facility] order, Performed in [facility] hospital lab) Nasopharyngeal Nasopharyngeal Swab     Status: None   Collection Time: 06/20/19  8:19 PM   Specimen: Nasopharyngeal Swab  Result Value Ref Range Status   SARS Coronavirus 2 NEGATIVE NEGATIVE Final    Comment: (NOTE) If result is NEGATIVE SARS-CoV-2 target nucleic acids are NOT DETECTED. The SARS-CoV-2 RNA is generally detectable in upper and lower  respiratory specimens during the acute phase of infection. The lowest  concentration of SARS-CoV-2 viral copies this assay can detect is 250  copies / mL. A negative result does not preclude SARS-CoV-2 infection  and should not be used as the sole basis for treatment or other  patient management decisions.  A negative result may occur with  improper specimen collection / handling, submission of specimen other  than nasopharyngeal swab, presence of viral mutation(s) within the  areas targeted by this assay, and inadequate number of viral copies  (<250 copies / mL). A negative result must  be combined with clinical  observations, patient history, and epidemiological  information. If result is POSITIVE SARS-CoV-2 target nucleic acids are DETECTED. The SARS-CoV-2 RNA is generally detectable in upper and lower  respiratory specimens dur ing the acute phase of infection.  Positive  results are indicative of active infection with SARS-CoV-2.  Clinical  correlation with patient history and other diagnostic information is  necessary to determine patient infection status.  Positive results do  not rule out bacterial infection or co-infection with other viruses. If result is PRESUMPTIVE POSTIVE SARS-CoV-2 nucleic acids MAY BE PRESENT.   A presumptive positive result was obtained on the submitted specimen  and confirmed on repeat testing.  While 2019 novel coronavirus  (SARS-CoV-2) nucleic acids may be present in the submitted sample  additional confirmatory testing may be necessary for epidemiological  and / or clinical management purposes  to differentiate between  SARS-CoV-2 and other Sarbecovirus currently known to infect humans.  If clinically indicated additional testing with an alternate test  methodology [phone]) is advised. The SARS-CoV-2 RNA is generally  detectable in upper and lower respiratory sp ecimens during the acute  phase of infection. The expected result is Negative. Fact Sheet for Patients:  StrictlyIdeas.no Fact Sheet for Healthcare Providers: BankingDealers.co.za This test is not yet approved or cleared by the [place] FDA and has been authorized for detection and/or diagnosis of SARS-CoV-2 by FDA under an Emergency Use Authorization (EUA).  This EUA will remain in effect (meaning this test can be used) for the duration of the COVID-19 declaration under Section 564(b)(1) of the Act, 21 U.S.C. section 360bbb-3(b)(1), unless the authorization is terminated or revoked sooner. Performed at [facility], [address] [place], [place] [address]   Urine culture      Status: None   Collection Time: 06/20/19  8:32 PM   Specimen: In/Out Cath Urine  Result Value Ref Range Status   Specimen Description   Final    IN/OUT CATH URINE Performed at [facility], [address] [place], [place] [address]    Special Requests   Final    NONE Performed at [facility], [address] [place], [place] [address]    Culture   Final    NO GROWTH Performed at [facility], [place] [address] [place], [place] [address]    Report Status 06/21/2019 FINAL  Final  MRSA PCR Screening     Status: None   Collection Time: 06/21/19  3:53 AM   Specimen: Nasal Mucosa; Nasopharyngeal  Result Value Ref Range Status   MRSA by PCR NEGATIVE NEGATIVE Final    Comment:        The GeneXpert MRSA Assay (FDA approved for NASAL specimens only), is one component of a comprehensive MRSA colonization surveillance program. It is not intended to diagnose MRSA infection nor to guide or monitor treatment for MRSA infections. Performed at [facility], [place] [address], [place], [place] [address]      Liver Function Tests: Recent Labs  Lab 06/20/19 2000 06/22/19 0525  AST 20 15  ALT 24 18  ALKPHOS 89 82  BILITOT 0.6 0.3  PROT 5.7* 5.2*  ALBUMIN 2.6* 2.1*      Studies: Dg Abd 1 View  Result Date: 06/23/2019 CLINICAL DATA:  Nasogastric tube placed EXAM: ABDOMEN - 1 VIEW COMPARISON:  None. FINDINGS: Nasogastric tube with the tip projecting over the body of the stomach. There is no bowel  dilatation to suggest obstruction. There is no evidence of pneumoperitoneum, portal venous gas or pneumatosis. There are no pathologic calcifications along the expected course of the ureters. The osseous structures are unremarkable. IMPRESSION: Nasogastric tube with the tip projecting over the body of the stomach. Electronically Signed   By: Kathreen Devoid   On: 06/23/2019 09:47   Dg Abd 1 View  Result Date: 06/22/2019 CLINICAL DATA:  NG tube  placement EXAM: ABDOMEN - 1 VIEW COMPARISON:  None. FINDINGS: Enteric tube looped in the stomach with its tip in the distal gastric body. Nonobstructive bowel gas pattern. Visualized osseous structures are within normal limits. IMPRESSION: Enteric tube with its tip in the distal gastric body. Electronically Signed   By: Julian Hy M.D.   On: 06/22/2019 13:43   Ct Abdomen Pelvis W Contrast  Result Date: 06/21/2019 CLINICAL DATA:  Abdominal pain, fever, metastatic breast cancer, peritoneal carcinoma EXAM: CT ABDOMEN AND PELVIS WITH CONTRAST TECHNIQUE: Multidetector CT imaging of the abdomen and pelvis was performed using the standard protocol following bolus administration of intravenous contrast. CONTRAST:  16mL OMNIPAQUE IOHEXOL 300 MG/ML SOLN, 50mL OMNIPAQUE IOHEXOL 300 MG/ML SOLN COMPARISON:  None. FINDINGS: Lower chest: Moderate right, small left pleural effusions with associated atelectasis or consolidation. No definite pulmonary nodules or pleural nodularity noted. Hepatobiliary: Numerous low-attenuation liver lesions of varying sizes, incompletely characterized and possibly representing cysts versus metastases. No gallstones, gallbladder wall thickening, or biliary dilatation. Pancreas: Unremarkable. No pancreatic ductal dilatation or surrounding inflammatory changes. Spleen: Normal in size without significant abnormality. Adrenals/Urinary Tract: Adrenal glands are unremarkable. Kidneys are normal, without renal calculi, solid lesion, or hydronephrosis. Foley catheter in the decompressed urinary bladder. Stomach/Bowel: The stomach is grossly fluid distended. The small bowel is fluid distended, largest loops measuring 3.9 cm, with overt fecalization of the most distal small bowel and a tightly knuckled transition point in the central right lower quadrant (series 2, image 63). There is gas and stool within the colon to the rectum. Vascular/Lymphatic: No significant vascular findings are present. No  enlarged abdominal or pelvic lymph nodes. Reproductive: Large uterine fibroid. Other: No abdominal wall hernia or abnormality. Moderate volume ascites with diffuse peritoneal and omental nodularity, consistent with carcinomatosis. Musculoskeletal: Status post laminectomy of T11 and T12. IMPRESSION: 1. The stomach is grossly fluid distended. The small bowel is fluid distended, largest loops measuring 3.9 cm, with overt fecalization of the most distal small bowel and a tightly knuckled transition point in the central right lower quadrant (series 2, image 63). Findings are consistent with distal small bowel obstruction, which may be incomplete given the presence of gas and stool in the colon and rectum. 2. Moderate volume ascites with diffuse peritoneal and omental nodularity, consistent with carcinomatosis. 3. Numerous low-attenuation liver lesions of varying sizes, incompletely characterized and possibly representing cysts versus metastases. 4. Moderate right, small left pleural effusions with associated atelectasis or consolidation, presumably malignant. No definite pulmonary nodules or pleural nodularity noted. 5. Status post laminectomy of T11 and T12. 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 These results will be called to the ordering clinician or representative by the Radiologist Assistant, and communication documented in the PACS or zVision Dashboard. Electronically Signed   By: Eddie Candle M.D.   On: 06/21/2019 17:49     Admission status: Inpatient: Based on patients clinical presentation and evaluation of above clinical data, I have made determination that patient meets Inpatient criteria at  this time.  Time spent: 20 minutes  [facility] Pager [phone]. If 7PM-7AM, please contact night-coverage at [email or web address], Office  [phone]  password [email or web address]  06/23/2019, 11:06 AM  LOS: 3  days

## 2019-06-23 NOTE — Progress Notes (Signed)
Patient's husband called and stated he "needed patient moved to a room with a window, she needs to see sunlight in the morning. He would be glad to talk to the doctor and explain how this room is affecting her mentally." I assured him I would get with charge nurse and see what could be done. I talked with charge and she is working to try and move patient.

## 2019-06-23 NOTE — Progress Notes (Signed)
Called hospitalist about HR elevated and made him aware of husbands concerns. He gave one time dose of Lopressor. He agreed the patient is to sick to move upstairs so we will try and find room with window here.

## 2019-06-23 NOTE — Progress Notes (Signed)
Patient has temp of 100.8 prior to blood administration. Paged Dr. Darrick Meigs to confirm start of blood products. Orders to give tylenol and then to start blood. Rectal tylenol given and blood started at 0915.

## 2019-06-24 ENCOUNTER — Inpatient Hospital Stay (HOSPITAL_COMMUNITY): Payer: BC Managed Care – PPO

## 2019-06-24 LAB — CBC
HCT: 30 % — ABNORMAL LOW (ref 36.0–46.0)
Hemoglobin: 9.5 g/dL — ABNORMAL LOW (ref 12.0–15.0)
MCH: 27.5 pg (ref 26.0–34.0)
MCHC: 31.7 g/dL (ref 30.0–36.0)
MCV: 87 fL (ref 80.0–100.0)
Platelets: 415 10*3/uL — ABNORMAL HIGH (ref 150–400)
RBC: 3.45 MIL/uL — ABNORMAL LOW (ref 3.87–5.11)
RDW: 19.2 % — ABNORMAL HIGH (ref 11.5–15.5)
WBC: 7.4 10*3/uL (ref 4.0–10.5)
nRBC: 0 % (ref 0.0–0.2)

## 2019-06-24 LAB — COMPREHENSIVE METABOLIC PANEL
ALT: 18 U/L (ref 0–44)
AST: 14 U/L — ABNORMAL LOW (ref 15–41)
Albumin: 2 g/dL — ABNORMAL LOW (ref 3.5–5.0)
Alkaline Phosphatase: 84 U/L (ref 38–126)
Anion gap: 8 (ref 5–15)
BUN: 17 mg/dL (ref 6–20)
CO2: 18 mmol/L — ABNORMAL LOW (ref 22–32)
Calcium: 8.2 mg/dL — ABNORMAL LOW (ref 8.9–10.3)
Chloride: 115 mmol/L — ABNORMAL HIGH (ref 98–111)
Creatinine, Ser: 0.55 mg/dL (ref 0.44–1.00)
GFR calc Af Amer: >60 mL/min (ref >60)
GFR calc non Af Amer: >60 mL/min (ref >60)
Glucose, Bld: 123 mg/dL — ABNORMAL HIGH (ref 70–99)
Potassium: 2.8 mmol/L — ABNORMAL LOW (ref 3.5–5.1)
Sodium: 141 mmol/L (ref 135–145)
Total Bilirubin: 0.4 mg/dL (ref 0.3–1.2)
Total Protein: 5.2 g/dL — ABNORMAL LOW (ref 6.5–8.1)

## 2019-06-24 LAB — GLUCOSE, CAPILLARY
Glucose-Capillary: 100 mg/dL — ABNORMAL HIGH (ref 70–99)
Glucose-Capillary: 103 mg/dL — ABNORMAL HIGH (ref 70–99)
Glucose-Capillary: 108 mg/dL — ABNORMAL HIGH (ref 70–99)
Glucose-Capillary: 123 mg/dL — ABNORMAL HIGH (ref 70–99)
Glucose-Capillary: 131 mg/dL — ABNORMAL HIGH (ref 70–99)
Glucose-Capillary: 84 mg/dL (ref 70–99)

## 2019-06-24 LAB — TYPE AND SCREEN
ABO/RH(D): A POS
Antibody Screen: NEGATIVE
Unit division: 0

## 2019-06-24 LAB — BPAM RBC
Blood Product Expiration Date: 202010262359
ISSUE DATE / TIME: 202010040908
Unit Type and Rh: 6200

## 2019-06-24 LAB — MAGNESIUM: Magnesium: 1.6 mg/dL — ABNORMAL LOW (ref 1.7–2.4)

## 2019-06-24 LAB — ABO/RH: ABO/RH(D): A POS

## 2019-06-24 LAB — APTT: aPTT: 94 seconds — ABNORMAL HIGH (ref 24–36)

## 2019-06-24 LAB — HEPARIN LEVEL (UNFRACTIONATED): Heparin Unfractionated: 0.94 IU/mL — ABNORMAL HIGH (ref 0.30–0.70)

## 2019-06-24 LAB — PREALBUMIN: Prealbumin: 5.3 mg/dL — ABNORMAL LOW (ref 18–38)

## 2019-06-24 MED ORDER — KCL IN DEXTROSE-NACL 20-5-0.45 MEQ/L-%-% IV SOLN
INTRAVENOUS | Status: DC
  Administered 2019-06-24 – 2019-06-29 (×10): via INTRAVENOUS
  Filled 2019-06-24 (×11): qty 1000

## 2019-06-24 MED ORDER — POTASSIUM CHLORIDE 10 MEQ/100ML IV SOLN
10.0000 meq | INTRAVENOUS | Status: AC
  Administered 2019-06-24 (×4): 10 meq via INTRAVENOUS
  Filled 2019-06-24 (×4): qty 100

## 2019-06-24 MED ORDER — MAGNESIUM SULFATE 2 GM/50ML IV SOLN
2.0000 g | Freq: Once | INTRAVENOUS | Status: AC
  Administered 2019-06-24: 2 g via INTRAVENOUS
  Filled 2019-06-24: qty 50

## 2019-06-24 MED ORDER — METOPROLOL TARTRATE 5 MG/5ML IV SOLN
5.0000 mg | Freq: Once | INTRAVENOUS | Status: AC
  Administered 2019-06-24: 5 mg via INTRAVENOUS
  Filled 2019-06-24: qty 5

## 2019-06-24 NOTE — TOC Initial Note (Signed)
Transition of Care [facility]) - Initial/Assessment Note    Patient Details  Name: Kaylee Randolph MRN: [id number] Date of Birth: [birth year]  [facility] CM/SW Contact:    Victoria Euceda, Meriam Sprague, RN Phone Number: 06/24/2019, 10:31 AM  Clinical Narrative:                 Pt was active with [facility]) prior to admit. Will need orders for discharge if needs to continue home health services.  Expected Discharge Plan: Home w Home Health Services Barriers to Discharge: Continued Medical Work up   Patient Goals and CMS Choice        Expected Discharge Plan and Services Expected Discharge Plan: Home w Home Health Services   Discharge Planning Services: CM Consult   Living arrangements for the past 2 months: Single Family Home Expected Discharge Date: 06/25/19                           [facility] Agency: [facility]        Prior Living Arrangements/Services Living arrangements for the past 2 months: Single Family Home Lives with:: Spouse              Current home services: Home RN, Horticulturist, commercial    Activities of Daily Living Home Assistive Devices/Equipment: Bathtub lift, Bedside commode/3-in-1, Blood pressure cuff, Wheelchair ADL Screening (condition at time of admission) Patient's cognitive ability adequate to safely complete daily activities?: Yes Is the patient deaf or have difficulty hearing?: No Does the patient have difficulty seeing, even when wearing glasses/contacts?: No Does the patient have difficulty concentrating, remembering, or making decisions?: No Patient able to express need for assistance with ADLs?: Yes Does the patient have difficulty dressing or bathing?: Yes Independently performs ADLs?: No Communication: Independent Dressing (OT): Needs assistance Is this a change from baseline?: Pre-admission baseline Grooming: Needs assistance Is this a change from baseline?: Pre-admission baseline Feeding: Independent Bathing:  Needs assistance Is this a change from baseline?: Pre-admission baseline Toileting: Needs assistance Is this a change from baseline?: Pre-admission baseline In/Out Bed: Needs assistance Is this a change from baseline?: Pre-admission baseline Walks in Home: Dependent Is this a change from baseline?: Pre-admission baseline Does the patient have difficulty walking or climbing stairs?: Yes Weakness of Legs: Both Weakness of Arms/Hands: None  Permission Sought/Granted                  Emotional Assessment              Admission diagnosis:  Sepsis [facility]) [A41.9] Patient Active Problem List   Diagnosis Date Noted  . Cauda equina compression (HCC) 06/21/2019  . PE (pulmonary thromboembolism) (HCC) 06/21/2019  . Sacral decubitus ulcer, stage II (HCC) 06/21/2019  . Hypertension   . Breast cancer metastasized to adrenal gland (HCC)   . Acute metabolic encephalopathy   . Hyponatremia   . AKI (acute kidney injury) (HCC)   . Sepsis (HCC)   . Normocytic anemia    PCP:  Elias Else, MD Pharmacy:   [facility] [address] [place], [place] - [address] [place] [address] Phone: [phone] Fax: [phone]     Social Determinants of Health (SDOH) Interventions    Readmission Risk Interventions Readmission Risk Prevention Plan 06/24/2019  Transportation Screening Complete  PCP or Specialist Appt within 3-5 Days Not Complete  Not Complete comments Not ready for dc  HRI or Home Care  Consult Complete  Social Work Consult for Recovery Care Planning/Counseling Not Complete  SW consult not completed comments NA  Palliative Care Screening Not Applicable  Medication Review Oceanographer) Complete  Some recent data might be hidden

## 2019-06-24 NOTE — Progress Notes (Signed)
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  Patient recently was diagnosed with E. coli UTI which was pansensitive and was treated with ciprofloxacin.  Patient started having fever was less reactive and more sleepy with confusion initially however mental status improved on arrival.  Patient does complain of abdominal pain.    Subjective   Patient seen and examined, feeling better than yesterday.  Had BM this morning.  Passing flatus   Assessment/Plan:    1. Small bowel obstruction/peritoneal carcinomatosis-seen on CT abdomen/pelvis due to metastatic disease from breast cancer.  NG tube with suction, patient is n.p.o.  General surgery following.  Slowly improving, NG tube will be clamped today and sips of clears will be started as per general surgery.    2. Sepsis-patient presented with high fever, tachycardia, tachypnea, procalcitonin 3.20, lactic acid 1.1, WBC 9.9.  Initially started on vancomycin and cefepime for presumed sepsis.  Unclear source of infection.  We will continue with empiric antibiotics at this time.  Patient is clinically improved.  Blood culture is negative to date.  Urine culture showed no growth.  3. History of pulmonary embolism-patient was on Xarelto at home.  Currently she is n.p.o., continue IV heparin.  4. Delirium-improved, patient  developed delirium, with fluctuating mental status.  Will obtain CT head to rule out underlying neurological abnormality.  Ativan did calm her down.  Will start Ativan 1 mg IV every 4 hours as needed  5. Acute  kidney injury-patient presented with creatinine of 1.82, improved with IV fluids.  Today creatinine is 0.78.  6. 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  Patient has bilateral leg weakness right more than left.  Foley catheter has been placed.  7. 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  Patient is currently on chemotherapy at [facility].  Last dose was a week ago.  Palliative care also was consulted at [facility].  8. Anemia chronic disease-hemoglobin is 9.8 this morning.  Status post blood transfusion yesterday.  9. Hypokalemia-potassium is still 2.8, despite replacement of potassium yesterday.  Magnesium was 1.8.  Will give 2 g of mag sulfate IV x1.  KCl 10 mg IV x4 has been given this morning.  Follow BMP in a.m.   10. Goals of care discussion-I discussed with patient's husband, who wants patient to be full code.  If patient does not improve in the next 1 to 2 days will consider palliative care consultation for formal goals of care discussion.       CBG: Recent Labs  Lab 06/23/19 1539 06/23/19 2004 06/23/19 2316 06/24/19 0557 06/24/19 0828  GLUCAP 93 121* 132* 108* 84    CBC: Recent Labs  Lab 06/20/19 2000 06/21/19 0523 06/22/19 0525 06/23/19 0508 06/24/19 0431  WBC 7.7 7.5 9.9 7.7 7.4  NEUTROABS 6.0  --   --   --   --   HGB 8.1* 7.4* 7.2* 6.8* 9.5*  HCT 25.6* 23.8* 23.3* 21.8* 30.0*  MCV 86.8 88.1 88.6 87.9 87.0  PLT 456* 372 386 351 415*    Basic Metabolic Panel: Recent Labs  Lab 06/20/19 2000 06/21/19 0523  06/22/19 0525 06/23/19 0508 06/24/19 0431  NA 127* 135 136 139 141  K 3.9 3.2* 3.2* 3.1* 2.8*  CL 89* 103 108 114* 115*  CO2 24 22 20* 21* 18*  GLUCOSE 101* 86 113* 135* 123*  BUN 21* 17 19 18 17   CREATININE 1.82* 1.01* 0.78 0.59 0.55  CALCIUM 8.7* 8.2* 8.0* 8.0* 8.2*  MG  --   --   --  1.8  --   PHOS  --   --   --  2.0*  --      DVT prophylaxis: Xarelto, patient is n.p.o.  We  will switch to Lovenox  Code Status: Full code  Family Communication: No family at bedside  Disposition Plan: likely home when medically ready for discharge Pressure Injury 06/20/19 Coccyx Mid Stage II -  Partial thickness loss of dermis presenting as a shallow open ulcer with a red, pink wound bed without slough. (Active)  06/20/19 2102  Location: Coccyx  Location Orientation: Mid  Staging: Stage II -  Partial thickness loss of dermis presenting as a shallow open ulcer with a red, pink wound bed without slough.  Wound Description (Comments):   Present on Admission: Yes      BMI  Estimated body mass index is 24.03 kg/m as calculated from the following:   Height as of this encounter: 5\' 4"  (1.626 m).   Weight as of this encounter: 63.5 kg.  Scheduled medications:  . chlorhexidine  15 mL Mouth Rinse BID  . Chlorhexidine Gluconate Cloth  6 each Topical Daily  . gabapentin  100 mg Oral TID  . mouth rinse  15 mL Mouth Rinse q12n4p  . mirtazapine  7.5 mg Oral QHS  . sodium chloride flush  10-40 mL Intracatheter Q12H  . tiZANidine  4 mg Oral TID    Consultants:  General surgery  Procedures:  NG tube placement on 06/22/2019   Antibiotics:   Anti-infectives (From admission, onward)   Start     Dose/Rate Route Frequency Ordered Stop   06/23/19 1100  vancomycin (VANCOCIN) IVPB 750 mg/150 ml premix     750 mg 150 mL/hr over 60 Minutes Intravenous Every 12 hours 06/23/19 1025     06/22/19 1600  ceFEPIme (MAXIPIME) 2 g in sodium chloride 0.9 % 100 mL IVPB     2 g 200 mL/hr over 30 Minutes Intravenous Every 8 hours 06/22/19 0921     06/22/19 1000  vancomycin (VANCOCIN) IVPB 1000 mg/200 mL premix  Status:  Discontinued     1,000 mg 200 mL/hr over 60 Minutes Intravenous Every 36 hours 06/20/19 2048 06/20/19 2049   06/22/19 0600  vancomycin (VANCOCIN) IVPB 1000 mg/200 mL premix  Status:  Discontinued     1,000 mg 200 mL/hr over 60 Minutes Intravenous Every 36 hours 06/20/19  2049 06/21/19 1322   06/21/19 2100  vancomycin (VANCOCIN) 1,250 mg in sodium chloride 0.9 % 250 mL IVPB  Status:  Discontinued     1,250 mg 166.7 mL/hr over 90 Minutes Intravenous Every 24 hours 06/21/19 1322 06/23/19 1025   06/21/19 0900  ceFEPIme (MAXIPIME) 2 g in sodium chloride 0.9 % 100 mL IVPB  Status:  Discontinued     2 g 200 mL/hr over 30 Minutes Intravenous Every 12 hours 06/20/19 2048 06/22/19 0921   06/20/19 2030  ceFEPIme (MAXIPIME) 2 g in sodium chloride 0.9 % 100 mL IVPB     2 g 200 mL/hr over 30 Minutes Intravenous  Once 06/20/19 2016 06/20/19 2134  06/20/19 2030  metroNIDAZOLE (FLAGYL) IVPB 500 mg     500 mg 100 mL/hr over 60 Minutes Intravenous  Once 06/20/19 2016 06/20/19 2249   06/20/19 2030  vancomycin (VANCOCIN) IVPB 1000 mg/200 mL premix     1,000 mg 200 mL/hr over 60 Minutes Intravenous  Once 06/20/19 2016 06/20/19 2143       Objective   Vitals:   06/24/19 0500 06/24/19 0600 06/24/19 0606 06/24/19 0700  BP:   (!) 155/106 (!) 154/100  Pulse: (!) 39  75 (!) 123  Resp: (!) 41 (!) 35 (!) 33 (!) 33  Temp: (!) 100.9 F (38.3 C) (!) 101.3 F (38.5 C) (!) 101.3 F (38.5 C) (!) 102 F (38.9 C)  TempSrc:      SpO2: 96%  100% 99%  Weight:      Height:        Intake/Output Summary (Last 24 hours) at 06/24/2019 0919 Last data filed at 06/24/2019 [id number] Gross per 24 hour  Intake 3416.6 ml  Output 1750 ml  Net 1666.6 ml   Filed Weights   06/20/19 2014  Weight: 63.5 kg     Physical Examination:  General-appears in no acute distress Heart-S1-S2, regular, no murmur auscultated Lungs-clear to auscultation bilaterally, no wheezing or crackles auscultated Abdomen-soft, nontender, no organomegaly Extremities-no edema in the lower extremities Neuro-alert, oriented x3, no focal deficit noted    Data Reviewed: I have personally reviewed following labs and imaging studies   Recent Results (from the past 240 hour(s))  Culture, blood (Routine x 2)      Status: None (Preliminary result)   Collection Time: 06/20/19  8:00 PM   Specimen: BLOOD  Result Value Ref Range Status   Specimen Description   Final    BLOOD CHEST PORTA CATH Performed at [facility], [address] [place], [place] [address]    Special Requests   Final    BOTTLES DRAWN AEROBIC AND ANAEROBIC Blood Culture adequate volume Performed at [facility], [address] [place], [place] [address]    Culture   Final    NO GROWTH 4 DAYS Performed at [facility], [place] [address], [place], [place] [address]    Report Status PENDING  Incomplete  Culture, blood (Routine x 2)     Status: None (Preliminary result)   Collection Time: 06/20/19  8:10 PM   Specimen: BLOOD  Result Value Ref Range Status   Specimen Description   Final    BLOOD RIGHT ANTECUBITAL Performed at [facility], [address] [place], [place] [address]    Special Requests   Final    BOTTLES DRAWN AEROBIC AND ANAEROBIC Blood Culture adequate volume Performed at [facility], [address] [place], [place] [address]    Culture   Final    NO GROWTH 4 DAYS Performed at [facility], [place] [address], [place], [place] [address]    Report Status PENDING  Incomplete  SARS Coronavirus 2 [facility] order, Performed in [facility] lab) Nasopharyngeal Nasopharyngeal Swab     Status: None   Collection Time: 06/20/19  8:19 PM   Specimen: Nasopharyngeal Swab  Result Value Ref Range Status   SARS Coronavirus 2 NEGATIVE NEGATIVE Final    Comment: (NOTE) If result is NEGATIVE SARS-CoV-2 target nucleic acids are NOT DETECTED. The SARS-CoV-2 RNA is generally detectable in upper and lower  respiratory specimens during the acute phase of infection.  The lowest  concentration of SARS-CoV-2 viral copies this assay can detect is 250  copies / mL. A negative result does not preclude SARS-CoV-2 infection  and should not be used as the sole basis for  treatment or other  patient management decisions.  A negative result may occur with  improper specimen collection / handling, submission of specimen other  than nasopharyngeal swab, presence of viral mutation(s) within the  areas targeted by this assay, and inadequate number of viral copies  (<250 copies / mL). A negative result must be combined with clinical  observations, patient history, and epidemiological information. If result is POSITIVE SARS-CoV-2 target nucleic acids are DETECTED. The SARS-CoV-2 RNA is generally detectable in upper and lower  respiratory specimens dur ing the acute phase of infection.  Positive  results are indicative of active infection with SARS-CoV-2.  Clinical  correlation with patient history and other diagnostic information is  necessary to determine patient infection status.  Positive results do  not rule out bacterial infection or co-infection with other viruses. If result is PRESUMPTIVE POSTIVE SARS-CoV-2 nucleic acids MAY BE PRESENT.   A presumptive positive result was obtained on the submitted specimen  and confirmed on repeat testing.  While 2019 novel coronavirus  (SARS-CoV-2) nucleic acids may be present in the submitted sample  additional confirmatory testing may be necessary for epidemiological  and / or clinical management purposes  to differentiate between  SARS-CoV-2 and other Sarbecovirus currently known to infect humans.  If clinically indicated additional testing with an alternate test  methodology [phone]) is advised. The SARS-CoV-2 RNA is generally  detectable in upper and lower respiratory sp ecimens during the acute  phase of infection. The expected result is Negative. Fact Sheet for Patients:  StrictlyIdeas.no Fact Sheet for Healthcare Providers: BankingDealers.co.za This test is not yet approved or cleared by the [place] FDA and has been authorized for detection and/or  diagnosis of SARS-CoV-2 by FDA under an Emergency Use Authorization (EUA).  This EUA will remain in effect (meaning this test can be used) for the duration of the COVID-19 declaration under Section 564(b)(1) of the Act, 21 U.S.C. section 360bbb-3(b)(1), unless the authorization is terminated or revoked sooner. Performed at [facility], [address] [place], [place] [address]   Urine culture     Status: None   Collection Time: 06/20/19  8:32 PM   Specimen: In/Out Cath Urine  Result Value Ref Range Status   Specimen Description   Final    IN/OUT CATH URINE Performed at [facility], [address] [place], [place] [address]    Special Requests   Final    NONE Performed at [facility], [address] [place], [place] [address]    Culture   Final    NO GROWTH Performed at [facility], [place] [address], [place], [place] [address]    Report Status 06/21/2019 FINAL  Final  MRSA PCR Screening     Status: None   Collection Time: 06/21/19  3:53 AM   Specimen: Nasal Mucosa; Nasopharyngeal  Result Value Ref Range Status   MRSA by PCR NEGATIVE NEGATIVE Final    Comment:        The GeneXpert MRSA Assay (FDA approved for NASAL specimens only), is one component of a comprehensive MRSA colonization surveillance program. It is not intended to diagnose MRSA infection nor to guide or monitor treatment for MRSA infections. Performed at [facility],  [place] [address], [place], [place] [address]      Liver Function Tests: Recent Labs  Lab 06/20/19 2000 06/22/19 0525 06/24/19 0431  AST 20 15 14*  ALT 24 18 18   ALKPHOS 89 82 84  BILITOT 0.6 0.3 0.4  PROT 5.7* 5.2* 5.2*  ALBUMIN 2.6* 2.1* 2.0*      Studies: Dg Abd 1 View  Result Date: 06/24/2019 CLINICAL DATA:  Nasogastric tube placement EXAM: ABDOMEN - 1 VIEW COMPARISON:  None. FINDINGS: Tip and side port of the nasogastric tube project over the stomach.  Nonobstructive bowel gas pattern. IMPRESSION: Nasogastric tube tip in the stomach. Electronically Signed   By: Ulyses Jarred M.D.   On: 06/24/2019 03:36   Dg Abd 1 View  Result Date: 06/23/2019 CLINICAL DATA:  Enteric tube placement EXAM: ABDOMEN - 1 VIEW COMPARISON:  Abdominal radiograph from earlier today FINDINGS: Enteric tube terminates in the distal stomach. Persistent dilated small bowel loops up to the 4.3 cm in the left abdomen. No evidence of pneumatosis or pneumoperitoneum. Small right pleural effusion appears unchanged. Patchy bibasilar lung opacities appear unchanged. No radiopaque nephrolithiasis. IMPRESSION: 1. Enteric tube terminates in the distal stomach. 2. Persistent dilated small bowel loops in the left abdomen compatible with mid to distal small bowel obstruction. 3. Stable small right pleural effusion and patchy bibasilar lung opacities. Electronically Signed   By: Ilona Sorrel M.D.   On: 06/23/2019 11:49   Dg Abd 1 View  Result Date: 06/23/2019 CLINICAL DATA:  Nasogastric tube placed EXAM: ABDOMEN - 1 VIEW COMPARISON:  None. FINDINGS: Nasogastric tube with the tip projecting over the body of the stomach. There is no bowel dilatation to suggest obstruction. There is no evidence of pneumoperitoneum, portal venous gas or pneumatosis. There are no pathologic calcifications along the expected course of the ureters. The osseous structures are unremarkable. IMPRESSION: Nasogastric tube with the tip projecting over the body of the stomach. Electronically Signed   By: Kathreen Devoid   On: 06/23/2019 09:47   Dg Abd 1 View  Result Date: 06/22/2019 CLINICAL DATA:  NG tube placement EXAM: ABDOMEN - 1 VIEW COMPARISON:  None. FINDINGS: Enteric tube looped in the stomach with its tip in the distal gastric body. Nonobstructive bowel gas pattern. Visualized osseous structures are within normal limits. IMPRESSION: Enteric tube with its tip in the distal gastric body. Electronically Signed   By: Julian Hy M.D.   On: 06/22/2019 13:43     Admission status: Inpatient: Based on patients clinical presentation and evaluation of above clinical data, I have made determination that patient meets Inpatient criteria at this time.  Time spent: 20 minutes  [facility] Pager [phone]. If 7PM-7AM, please contact night-coverage at [email or web address], Office  [phone]  password [email or web address]  06/24/2019, 9:19 AM  LOS: 4 days

## 2019-06-24 NOTE — Progress Notes (Signed)
[place] for heparin  Indication: hx pulmonary embolus (holding xarelto)  No Known Allergies  Patient Measurements: Height: 5\' 4"  (162.6 cm) Weight: 140 lb (63.5 kg) IBW/kg (Calculated) : 54.7 Heparin Dosing Weight: 63 kg  Vital Signs: Temp: 102 F (38.9 C) (10/05 0700) BP: 154/100 (10/05 0700) Pulse Rate: 123 (10/05 0700)  Labs: Recent Labs    06/22/19 0525 06/23/19 0508 06/23/19 0850 06/23/19 1520 06/24/19 0431  HGB 7.2* 6.8*  --   --  9.5*  HCT 23.3* 21.8*  --   --  30.0*  PLT 386 351  --   --  415*  APTT  --   --  50* 86* 94*  HEPARINUNFRC  --   --  1.43* 1.15* 0.94*  CREATININE 0.78 0.59  --   --  0.55    Estimated Creatinine Clearance: 70.2 mL/min (by C-G formula based on SCr of 0.55 mg/dL).   Medications:  Xarelto 20 mg daily PTA  Assessment: Patient is a 53 y.o F with hx metastatic breast and peritoneal cancer on chemotherapy and PE on xarelto PTA, presented to [facility] ED on 10/1 with c/o fever. Abd CT on 10/2 showed findings consistent with distal SBO. CCS recommends NG tube for decompression and bowel rest.  Pharmacy was consulted on 10/3 to transition patient from [place] to heparin drip on 10/3.  Today, 06/24/2019:   aPTT =94,therapeutic on 1000 units/hr  Heparin level elevated as expected with recent DOAC  CBC: Hgb 9.4,  Plt stable WNL  No bleeding noted   Goal of Therapy:  Heparin level 0.3-0.7 units/ml  APTT 66-102 sec Monitor platelets by anticoagulation protocol: Yes   Plan:   Continue IV heparin at 1000 units/hr  Daily heparin/ aPTT level and CBC  Monitor for signs of bleeding or thrombosis  F/u ability to resume Bernie 06/24/2019, 9:13 AM Pager [phone]

## 2019-06-24 NOTE — Progress Notes (Signed)
Subjective: CC: SBO Patient reports that she feels her abdominal distension, epigastric abdominal pain and nausea have improved since yesterday. She is now passing flatus and had a BM this AM that she reports was formed and normal in appearance. She had 2 BM's recorded in the last 24 hours. 1250 from NGT in the last 24 hours.   Objective: Vital signs in last 24 hours: Temp:  [99.3 F (37.4 C)-102 F (38.9 C)] 102 F (38.9 C) (10/05 0700) Pulse Rate:  [39-143] 123 (10/05 0700) Resp:  [17-41] 33 (10/05 0700) BP: (120-167)/(75-114) 154/100 (10/05 0700) SpO2:  [92 %-100 %] 99 % (10/05 0700) Last BM Date: 06/22/19  Intake/Output from previous day: 10/04 0701 - 10/05 0700 In: 3416.6 [I.V.:2271; Blood:336.7; IV Piggyback:809] Out: 1750 [Urine:500; Emesis/NG output:1250] Intake/Output this shift: No intake/output data recorded.  PE: Gen:  Alert, NAD, pleasant Card:  Tachycardic  Pulm:  CTA b/l, normal rate and effort  Abd: Mild distension but soft, tenderness in the epigastrium without r/r/g or signs of peritonitis. NGT with cannister just replaced on LIWS. Green bile in tubing.  Ext:  1+ LE edema  Skin: no rashes noted, warm and dry  Lab Results:  Recent Labs    06/23/19 0508 06/24/19 0431  WBC 7.7 7.4  HGB 6.8* 9.5*  HCT 21.8* 30.0*  PLT 351 415*   BMET Recent Labs    06/23/19 0508 06/24/19 0431  NA 139 141  K 3.1* 2.8*  CL 114* 115*  CO2 21* 18*  GLUCOSE 135* 123*  BUN 18 17  CREATININE 0.59 0.55  CALCIUM 8.0* 8.2*   PT/INR No results for input(s): LABPROT, INR in the last 72 hours. CMP     Component Value Date/Time   NA 141 06/24/2019 0431   K 2.8 (L) 06/24/2019 0431   CL 115 (H) 06/24/2019 0431   CO2 18 (L) 06/24/2019 0431   GLUCOSE 123 (H) 06/24/2019 0431   BUN 17 06/24/2019 0431   CREATININE 0.55 06/24/2019 0431   CALCIUM 8.2 (L) 06/24/2019 0431   PROT 5.2 (L) 06/24/2019 0431   ALBUMIN 2.0 (L) 06/24/2019 0431   AST 14 (L) 06/24/2019 0431    ALT 18 06/24/2019 0431   ALKPHOS 84 06/24/2019 0431   BILITOT 0.4 06/24/2019 0431   GFRNONAA >60 06/24/2019 0431   GFRAA >60 06/24/2019 0431   Lipase  No results found for: LIPASE     Studies/Results: Dg Abd 1 View  Result Date: 06/24/2019 CLINICAL DATA:  Nasogastric tube placement EXAM: ABDOMEN - 1 VIEW COMPARISON:  None. FINDINGS: Tip and side port of the nasogastric tube project over the stomach. Nonobstructive bowel gas pattern. IMPRESSION: Nasogastric tube tip in the stomach. Electronically Signed   By: Ulyses Jarred M.D.   On: 06/24/2019 03:36   Dg Abd 1 View  Result Date: 06/23/2019 CLINICAL DATA:  Enteric tube placement EXAM: ABDOMEN - 1 VIEW COMPARISON:  Abdominal radiograph from earlier today FINDINGS: Enteric tube terminates in the distal stomach. Persistent dilated small bowel loops up to the 4.3 cm in the left abdomen. No evidence of pneumatosis or pneumoperitoneum. Small right pleural effusion appears unchanged. Patchy bibasilar lung opacities appear unchanged. No radiopaque nephrolithiasis. IMPRESSION: 1. Enteric tube terminates in the distal stomach. 2. Persistent dilated small bowel loops in the left abdomen compatible with mid to distal small bowel obstruction. 3. Stable small right pleural effusion and patchy bibasilar lung opacities. Electronically Signed   By: Janina Mayo.D.  On: 06/23/2019 11:49   Dg Abd 1 View  Result Date: 06/23/2019 CLINICAL DATA:  Nasogastric tube placed EXAM: ABDOMEN - 1 VIEW COMPARISON:  None. FINDINGS: Nasogastric tube with the tip projecting over the body of the stomach. There is no bowel dilatation to suggest obstruction. There is no evidence of pneumoperitoneum, portal venous gas or pneumatosis. There are no pathologic calcifications along the expected course of the ureters. The osseous structures are unremarkable. IMPRESSION: Nasogastric tube with the tip projecting over the body of the stomach. Electronically Signed   By: Kathreen Devoid    On: 06/23/2019 09:47   Dg Abd 1 View  Result Date: 06/22/2019 CLINICAL DATA:  NG tube placement EXAM: ABDOMEN - 1 VIEW COMPARISON:  None. FINDINGS: Enteric tube looped in the stomach with its tip in the distal gastric body. Nonobstructive bowel gas pattern. Visualized osseous structures are within normal limits. IMPRESSION: Enteric tube with its tip in the distal gastric body. Electronically Signed   By: Julian Hy M.D.   On: 06/22/2019 13:43    Anti-infectives: Anti-infectives (From admission, onward)   Start     Dose/Rate Route Frequency Ordered Stop   06/23/19 1100  vancomycin (VANCOCIN) IVPB 750 mg/150 ml premix     750 mg 150 mL/hr over 60 Minutes Intravenous Every 12 hours 06/23/19 1025     06/22/19 1600  ceFEPIme (MAXIPIME) 2 g in sodium chloride 0.9 % 100 mL IVPB     2 g 200 mL/hr over 30 Minutes Intravenous Every 8 hours 06/22/19 0921     06/22/19 1000  vancomycin (VANCOCIN) IVPB 1000 mg/200 mL premix  Status:  Discontinued     1,000 mg 200 mL/hr over 60 Minutes Intravenous Every 36 hours 06/20/19 2048 06/20/19 2049   06/22/19 0600  vancomycin (VANCOCIN) IVPB 1000 mg/200 mL premix  Status:  Discontinued     1,000 mg 200 mL/hr over 60 Minutes Intravenous Every 36 hours 06/20/19 2049 06/21/19 1322   06/21/19 2100  vancomycin (VANCOCIN) 1,250 mg in sodium chloride 0.9 % 250 mL IVPB  Status:  Discontinued     1,250 mg 166.7 mL/hr over 90 Minutes Intravenous Every 24 hours 06/21/19 1322 06/23/19 1025   06/21/19 0900  ceFEPIme (MAXIPIME) 2 g in sodium chloride 0.9 % 100 mL IVPB  Status:  Discontinued     2 g 200 mL/hr over 30 Minutes Intravenous Every 12 hours 06/20/19 2048 06/22/19 0921   06/20/19 2030  ceFEPIme (MAXIPIME) 2 g in sodium chloride 0.9 % 100 mL IVPB     2 g 200 mL/hr over 30 Minutes Intravenous  Once 06/20/19 2016 06/20/19 2134   06/20/19 2030  metroNIDAZOLE (FLAGYL) IVPB 500 mg     500 mg 100 mL/hr over 60 Minutes Intravenous  Once 06/20/19 2016 06/20/19  2249   06/20/19 2030  vancomycin (VANCOCIN) IVPB 1000 mg/200 mL premix     1,000 mg 200 mL/hr over 60 Minutes Intravenous  Once 06/20/19 2016 06/20/19 2143       Assessment/Plan Hx PE - Xarelto on Hold  AKI - Resolved Hx Cauda Equina Syndrome Anemia - S/p 1U 10/4. Hgb 9.5 (10/5) Hypokalemia - Replaced by hospitalist. 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  - Xray this AM with non-obstructive bowel gas pattern - Trial of clamping NGT and clears from the floor - Maximize nausea medication - Zofran and  Phenergan  - Keep K >4 and Mg > 2 for bowel function  FEN - Clamp NGT. Sips of clears from the floor. Continue to replace K. Check Mg.  VTE - Heparin ID - Vanc/Cefepime 10/1 >>    LOS: 4 days    Jillyn Ledger , [facility] 06/24/2019, 8:13 AM Pager: [phone]

## 2019-06-25 DIAGNOSIS — K56609 Unspecified intestinal obstruction, unspecified as to partial versus complete obstruction: Secondary | ICD-10-CM

## 2019-06-25 LAB — HEPARIN LEVEL (UNFRACTIONATED)
Heparin Unfractionated: 0.86 IU/mL — ABNORMAL HIGH (ref 0.30–0.70)
Heparin Unfractionated: 0.52 IU/mL (ref 0.30–0.70)
Heparin Unfractionated: 0.6 IU/mL (ref 0.30–0.70)

## 2019-06-25 LAB — COMPREHENSIVE METABOLIC PANEL
ALT: 12 U/L (ref 0–44)
AST: 12 U/L — ABNORMAL LOW (ref 15–41)
Albumin: 1.7 g/dL — ABNORMAL LOW (ref 3.5–5.0)
Alkaline Phosphatase: 70 U/L (ref 38–126)
Anion gap: 8 (ref 5–15)
BUN: 19 mg/dL (ref 6–20)
CO2: 18 mmol/L — ABNORMAL LOW (ref 22–32)
Calcium: 7.7 mg/dL — ABNORMAL LOW (ref 8.9–10.3)
Chloride: 115 mmol/L — ABNORMAL HIGH (ref 98–111)
Creatinine, Ser: 0.58 mg/dL (ref 0.44–1.00)
GFR calc Af Amer: >60 mL/min (ref >60)
GFR calc non Af Amer: >60 mL/min (ref >60)
Glucose, Bld: 132 mg/dL — ABNORMAL HIGH (ref 70–99)
Potassium: 3.9 mmol/L (ref 3.5–5.1)
Sodium: 141 mmol/L (ref 135–145)
Total Bilirubin: 0.7 mg/dL (ref 0.3–1.2)
Total Protein: 4.7 g/dL — ABNORMAL LOW (ref 6.5–8.1)

## 2019-06-25 LAB — CBC
HCT: 26.4 % — ABNORMAL LOW (ref 36.0–46.0)
Hemoglobin: 8.2 g/dL — ABNORMAL LOW (ref 12.0–15.0)
MCH: 27.2 pg (ref 26.0–34.0)
MCHC: 31.1 g/dL (ref 30.0–36.0)
MCV: 87.4 fL (ref 80.0–100.0)
Platelets: 376 10*3/uL (ref 150–400)
RBC: 3.02 MIL/uL — ABNORMAL LOW (ref 3.87–5.11)
RDW: 19.4 % — ABNORMAL HIGH (ref 11.5–15.5)
WBC: 9.2 10*3/uL (ref 4.0–10.5)
nRBC: 0 % (ref 0.0–0.2)

## 2019-06-25 LAB — CULTURE, BLOOD (ROUTINE X 2)
Culture: NO GROWTH
Culture: NO GROWTH
Special Requests: ADEQUATE
Special Requests: ADEQUATE

## 2019-06-25 LAB — GLUCOSE, CAPILLARY
Glucose-Capillary: 122 mg/dL — ABNORMAL HIGH (ref 70–99)
Glucose-Capillary: 120 mg/dL — ABNORMAL HIGH (ref 70–99)
Glucose-Capillary: 151 mg/dL — ABNORMAL HIGH (ref 70–99)
Glucose-Capillary: 133 mg/dL — ABNORMAL HIGH (ref 70–99)
Glucose-Capillary: 118 mg/dL — ABNORMAL HIGH (ref 70–99)
Glucose-Capillary: 127 mg/dL — ABNORMAL HIGH (ref 70–99)

## 2019-06-25 LAB — APTT
aPTT: 150 seconds — ABNORMAL HIGH (ref 24–36)
aPTT: 183 seconds (ref 24–36)
aPTT: 131 seconds — ABNORMAL HIGH (ref 24–36)

## 2019-06-25 LAB — PROCALCITONIN: Procalcitonin: 1.16 ng/mL

## 2019-06-25 MED ORDER — ENSURE ENLIVE PO LIQD
237.0000 mL | Freq: Two times a day (BID) | ORAL | Status: DC
  Administered 2019-06-25: 10:00:00 237 mL via ORAL

## 2019-06-25 MED ORDER — HEPARIN (PORCINE) 25000 UT/250ML-% IV SOLN
900.0000 [IU]/h | INTRAVENOUS | Status: DC
  Administered 2019-06-26: 20:00:00 800 [IU]/h via INTRAVENOUS
  Filled 2019-06-25: qty 250

## 2019-06-25 MED ORDER — FAMOTIDINE 20 MG PO TABS
20.0000 mg | ORAL_TABLET | Freq: Every day | ORAL | Status: DC
  Administered 2019-06-25 – 2019-07-05 (×11): 20 mg via ORAL
  Filled 2019-06-25 (×11): qty 1

## 2019-06-25 NOTE — Progress Notes (Addendum)
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  Patient recently was diagnosed with E. coli UTI which was pansensitive and was treated with ciprofloxacin.  Patient started having fever was less reactive and more sleepy with confusion initially however mental status improved on arrival.  Patient does complain of abdominal pain.    Subjective   Patient seen and examined, NG tube has been removed.  General surgery has started patient on full liquid diet.  Patient tolerated diet very well.  Still spiking temp, T-max one 1.3.   Assessment/Plan:    1. Small bowel obstruction/peritoneal carcinomatosis-seen on CT abdomen/pelvis due to metastatic disease from breast cancer.  NG tube with suction, patient is n.p.o.  General surgery following.  Slowly improving, NG tube will be clamped today and sips of clears will be started as per general surgery.    2. Fever -patient presented with high fever, tachycardia, tachypnea, procalcitonin 3.20, lactic acid 1.1, WBC 9.9.  Initially started on vancomycin and cefepime for presumed sepsis.  Unclear source of infection.  .  Patient has clinically improved, completed 5 days of antibiotic therapy.  Blood culture is negative.  Urine culture showed no growth.  Patient continues to have fever despite clear source of infection.  Not sure about etiology.  Will discontinue IV antibiotics at this time and observe.  Repeat procalcitonin.  If patient continues to have fever, consider ID consultation in next 24 to 48 hours.  3. History of pulmonary  embolism-patient was on Xarelto at home.  Currently she is n.p.o., continue IV heparin.  4. Delirium-improved, patient  developed delirium, with fluctuating mental status.  She is back to baseline.  Continue to monitor closely.  5. Acute kidney injury-patient presented with creatinine of 1.82, improved with IV fluids.  Today creatinine is 0.58.  6. 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  Patient has bilateral leg weakness right more than left.  Foley catheter has been placed.  7. 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  Patient is currently on chemotherapy at [facility].  Last dose was a week ago.  Palliative care also was consulted at [facility].  8. Anemia chronic disease-hemoglobin is 8.2 this morning.  Status post blood transfusion.  9. Hypokalemia-replete  10. Goals of care discussion-I discussed with patient's husband, who wants patient to be full code.  If patient does not improve in the next 1 to 2 days will consider palliative care consultation for formal goals of care discussion.       CBG: Recent Labs  Lab 06/24/19 2000 06/24/19 2332 06/25/19 0409 06/25/19 0759 06/25/19 1244  GLUCAP 123* 131* 122* 120* 151*    CBC: Recent Labs  Lab 06/20/19 2000 06/21/19 0523 06/22/19 0525 06/23/19 0508 06/24/19 0431 06/25/19 0154  WBC 7.7 7.5 9.9 7.7 7.4 9.2  NEUTROABS 6.0  --   --   --   --   --   HGB 8.1* 7.4* 7.2* 6.8* 9.5* 8.2*  HCT 25.6* 23.8* 23.3* 21.8* 30.0* 26.4*  MCV 86.8 88.1 88.6 87.9 87.0 87.4  PLT 456* 372 386 351 415* 376    Basic  Metabolic Panel: Recent Labs  Lab 06/21/19 0523 06/22/19 0525 06/23/19 0508 06/24/19 0431 06/24/19 0954 06/25/19 0154  NA 135 136 139 141  --  141  K 3.2* 3.2* 3.1* 2.8*  --  3.9  CL 103 108 114* 115*  --  115*  CO2 22 20* 21* 18*  --  18*  GLUCOSE 86 113* 135* 123*  --  132*  BUN 17 19 18 17   --  19  CREATININE 1.01* 0.78 0.59 0.55  --  0.58  CALCIUM  8.2* 8.0* 8.0* 8.2*  --  7.7*  MG  --   --  1.8  --  1.6*  --   PHOS  --   --  2.0*  --   --   --      DVT prophylaxis: Xarelto, patient is n.p.o.  We will switch to Lovenox  Code Status: Full code  Family Communication: No family at bedside  Disposition Plan: likely home when medically ready for discharge Pressure Injury 06/20/19 Coccyx Mid Stage II -  Partial thickness loss of dermis presenting as a shallow open ulcer with a red, pink wound bed without slough. (Active)  06/20/19 2102  Location: Coccyx  Location Orientation: Mid  Staging: Stage II -  Partial thickness loss of dermis presenting as a shallow open ulcer with a red, pink wound bed without slough.  Wound Description (Comments):   Present on Admission: Yes      BMI  Estimated body mass index is 24.03 kg/m as calculated from the following:   Height as of this encounter: 5\' 4"  (1.626 m).   Weight as of this encounter: 63.5 kg.  Scheduled medications:  . chlorhexidine  15 mL Mouth Rinse BID  . Chlorhexidine Gluconate Cloth  6 each Topical Daily  . feeding supplement (ENSURE ENLIVE)  237 mL Oral BID BM  . gabapentin  100 mg Oral TID  . mouth rinse  15 mL Mouth Rinse q12n4p  . mirtazapine  7.5 mg Oral QHS  . sodium chloride flush  10-40 mL Intracatheter Q12H  . tiZANidine  4 mg Oral TID    Consultants:  General surgery  Procedures:  NG tube placement on 06/22/2019   Antibiotics:   Anti-infectives (From admission, onward)   Start     Dose/Rate Route Frequency Ordered Stop   06/23/19 1100  vancomycin (VANCOCIN) IVPB 750 mg/150 ml premix  Status:  Discontinued     750 mg 150 mL/hr over 60 Minutes Intravenous Every 12 hours 06/23/19 1025 06/25/19 1301   06/22/19 1600  ceFEPIme (MAXIPIME) 2 g in sodium chloride 0.9 % 100 mL IVPB  Status:  Discontinued     2 g 200 mL/hr over 30 Minutes Intravenous Every 8 hours 06/22/19 0921 06/25/19 1301   06/22/19 1000  vancomycin (VANCOCIN) IVPB 1000 mg/200 mL premix   Status:  Discontinued     1,000 mg 200 mL/hr over 60 Minutes Intravenous Every 36 hours 06/20/19 2048 06/20/19 2049   06/22/19 0600  vancomycin (VANCOCIN) IVPB 1000 mg/200 mL premix  Status:  Discontinued     1,000 mg 200 mL/hr over 60 Minutes Intravenous Every 36 hours 06/20/19 2049 06/21/19 1322   06/21/19 2100  vancomycin (VANCOCIN) 1,250 mg in sodium chloride 0.9 % 250 mL IVPB  Status:  Discontinued     1,250 mg 166.7 mL/hr over 90 Minutes Intravenous Every 24 hours 06/21/19 1322 06/23/19 1025   06/21/19 0900  ceFEPIme (MAXIPIME) 2 g in sodium chloride 0.9 % 100 mL  IVPB  Status:  Discontinued     2 g 200 mL/hr over 30 Minutes Intravenous Every 12 hours 06/20/19 2048 06/22/19 0921   06/20/19 2030  ceFEPIme (MAXIPIME) 2 g in sodium chloride 0.9 % 100 mL IVPB     2 g 200 mL/hr over 30 Minutes Intravenous  Once 06/20/19 2016 06/20/19 2134   06/20/19 2030  metroNIDAZOLE (FLAGYL) IVPB 500 mg     500 mg 100 mL/hr over 60 Minutes Intravenous  Once 06/20/19 2016 06/20/19 2249   06/20/19 2030  vancomycin (VANCOCIN) IVPB 1000 mg/200 mL premix     1,000 mg 200 mL/hr over 60 Minutes Intravenous  Once 06/20/19 2016 06/20/19 2143       Objective   Vitals:   06/25/19 0800 06/25/19 0900 06/25/19 1000 06/25/19 1147  BP: 123/75  108/76 116/72  Pulse: (!) 127 (!) 118 (!) 125 (!) 126  Resp: (!) 35 (!) 23 (!) 24 (!) 35  Temp: (!) 100.8 F (38.2 C) (!) 102.2 F (39 C) (!) 102 F (38.9 C) (!) 101.3 F (38.5 C)  TempSrc: Oral     SpO2: 100% 100% 98% 100%  Weight:      Height:        Intake/Output Summary (Last 24 hours) at 06/25/2019 1303 Last data filed at 06/25/2019 0600 Gross per 24 hour  Intake 1773.75 ml  Output 562 ml  Net 1211.75 ml   Filed Weights   06/20/19 2014  Weight: 63.5 kg     Physical Examination:  General-appears in no acute distress Heart-S1-S2, regular, no murmur auscultated Lungs-clear to auscultation bilaterally, no wheezing or crackles  auscultated Abdomen-soft, nontender, no organomegaly Extremities-trace edema in the lower extremities Neuro-alert, oriented x3, no focal deficit noted   Data Reviewed: I have personally reviewed following labs and imaging studies   Recent Results (from the past 240 hour(s))  Culture, blood (Routine x 2)     Status: None   Collection Time: 06/20/19  8:00 PM   Specimen: BLOOD  Result Value Ref Range Status   Specimen Description   Final    BLOOD CHEST PORTA CATH Performed at [facility], [address] [place], [place] [address]    Special Requests   Final    BOTTLES DRAWN AEROBIC AND ANAEROBIC Blood Culture adequate volume Performed at [facility], [address] [place], [place] [address]    Culture   Final    NO GROWTH 5 DAYS Performed at [facility], [place] [address], [place], [place] [address]    Report Status 06/25/2019 FINAL  Final  Culture, blood (Routine x 2)     Status: None   Collection Time: 06/20/19  8:10 PM   Specimen: BLOOD  Result Value Ref Range Status   Specimen Description   Final    BLOOD RIGHT ANTECUBITAL Performed at [facility], [address] [place], [place] [address]    Special Requests   Final    BOTTLES DRAWN AEROBIC AND ANAEROBIC Blood Culture adequate volume Performed at [facility], [address] [place], [place] [address]    Culture   Final    NO GROWTH 5 DAYS Performed at [facility], [place] [address], [place], [place] [address]    Report Status 06/25/2019 FINAL  Final  SARS Coronavirus 2 [facility] order, Performed in [facility] hospital lab) Nasopharyngeal Nasopharyngeal Swab     Status: None   Collection Time:  06/20/19  8:19 PM   Specimen: Nasopharyngeal Swab  Result Value Ref Range Status   SARS Coronavirus 2 NEGATIVE NEGATIVE Final    Comment: (NOTE) If result is NEGATIVE SARS-CoV-2 target nucleic acids are NOT DETECTED. The SARS-CoV-2 RNA is generally detectable in  upper and lower  respiratory specimens during the acute phase of infection. The lowest  concentration of SARS-CoV-2 viral copies this assay can detect is 250  copies / mL. A negative result does not preclude SARS-CoV-2 infection  and should not be used as the sole basis for treatment or other  patient management decisions.  A negative result may occur with  improper specimen collection / handling, submission of specimen other  than nasopharyngeal swab, presence of viral mutation(s) within the  areas targeted by this assay, and inadequate number of viral copies  (<250 copies / mL). A negative result must be combined with clinical  observations, patient history, and epidemiological information. If result is POSITIVE SARS-CoV-2 target nucleic acids are DETECTED. The SARS-CoV-2 RNA is generally detectable in upper and lower  respiratory specimens dur ing the acute phase of infection.  Positive  results are indicative of active infection with SARS-CoV-2.  Clinical  correlation with patient history and other diagnostic information is  necessary to determine patient infection status.  Positive results do  not rule out bacterial infection or co-infection with other viruses. If result is PRESUMPTIVE POSTIVE SARS-CoV-2 nucleic acids MAY BE PRESENT.   A presumptive positive result was obtained on the submitted specimen  and confirmed on repeat testing.  While 2019 novel coronavirus  (SARS-CoV-2) nucleic acids may be present in the submitted sample  additional confirmatory testing may be necessary for epidemiological  and / or clinical management purposes  to differentiate between  SARS-CoV-2 and other Sarbecovirus currently known to infect humans.  If clinically indicated additional testing with an alternate test  methodology [id number]) is advised. The SARS-CoV-2 RNA is generally  detectable in upper and lower respiratory sp ecimens during the acute  phase of infection. The expected result is  Negative. Fact Sheet for Patients:  StrictlyIdeas.no Fact Sheet for Healthcare Providers: BankingDealers.co.za This test is not yet approved or cleared by the [place] FDA and has been authorized for detection and/or diagnosis of SARS-CoV-2 by FDA under an Emergency Use Authorization (EUA).  This EUA will remain in effect (meaning this test can be used) for the duration of the COVID-19 declaration under Section 564(b)(1) of the Act, 21 U.S.C. section 360bbb-3(b)(1), unless the authorization is terminated or revoked sooner. Performed at [facility], [address] [place], [place] [address]   Urine culture     Status: None   Collection Time: 06/20/19  8:32 PM   Specimen: In/Out Cath Urine  Result Value Ref Range Status   Specimen Description   Final    IN/OUT CATH URINE Performed at [facility], [address] [place], [place] [address]    Special Requests   Final    NONE Performed at [facility], [address] [place], [place] [address]    Culture   Final    NO GROWTH Performed at [facility], [place] [address], [place], [place] [address]    Report Status 06/21/2019 FINAL  Final  MRSA PCR Screening     Status: None   Collection Time: 06/21/19  3:53 AM   Specimen: Nasal Mucosa; Nasopharyngeal  Result Value Ref Range Status   MRSA by  PCR NEGATIVE NEGATIVE Final    Comment:        The GeneXpert MRSA Assay (FDA approved for NASAL specimens only), is one component of a comprehensive MRSA colonization surveillance program. It is not intended to diagnose MRSA infection nor to guide or monitor treatment for MRSA infections. Performed at [facility], [place] [address] [place], [place] [address]      Liver Function Tests: Recent Labs  Lab 06/20/19 2000 06/22/19 0525 06/24/19 0431 06/25/19 0154  AST 20 15 14* 12*  ALT 24 18 18 12   ALKPHOS 89 82 84 70   BILITOT 0.6 0.3 0.4 0.7  PROT 5.7* 5.2* 5.2* 4.7*  ALBUMIN 2.6* 2.1* 2.0* 1.7*      Studies: Dg Abd 1 View  Result Date: 06/24/2019 CLINICAL DATA:  Nasogastric tube placement EXAM: ABDOMEN - 1 VIEW COMPARISON:  None. FINDINGS: Tip and side port of the nasogastric tube project over the stomach. Nonobstructive bowel gas pattern. IMPRESSION: Nasogastric tube tip in the stomach. Electronically Signed   By: Ulyses Jarred M.D.   On: 06/24/2019 03:36     Admission status: Inpatient: Based on patients clinical presentation and evaluation of above clinical data, I have made determination that patient meets Inpatient criteria at this time.  Time spent: 20 minutes  [facility] Pager [phone]. If 7PM-7AM, please contact night-coverage at [email or web address], Office  [phone]  password [email or web address]  06/25/2019, 1:03 PM  LOS: 5 days

## 2019-06-25 NOTE — Discharge Instructions (Signed)
Soft-Food Eating Plan A soft-food eating plan includes foods that are safe and easy to chew and swallow. Your health care provider or dietitian can help you find foods and flavors that fit into this plan. Follow this plan until your health care provider or dietitian says it is safe to start eating other foods and food textures. What are tips for following this plan? General guidelines   Take small bites of food, or cut food into pieces about  inch or smaller. Bite-sized pieces of food are easier to chew and swallow.  Eat moist foods. Avoid overly dry foods.  Avoid foods that: ? Are difficult to swallow, such as dry, chunky, crispy, or sticky foods. ? Are difficult to chew, such as hard, tough, or stringy foods. ? Contain nuts, seeds, or fruits.  Follow instructions from your dietitian about the types of liquids that are safe for you to swallow. You may be allowed to have: ? Thick liquids only. This includes only liquids that are thicker than honey. ? Thin and thick liquids. This includes all beverages and foods that become liquid at room temperature.  To make thick liquids: ? Purchase a commercial liquid thickening powder. These are available at grocery stores and pharmacies. ? Mix the thickener into liquids according to instructions on the label. ? Purchase ready-made thickened liquids. ? Thicken soup by pureeing, straining to remove chunks, and adding flour, potato flakes, or corn starch. ? Add commercial thickener to foods that become liquid at room temperature, such as milk shakes, yogurt, ice cream, gelatin, and sherbet.  Ask your health care provider whether you need to take a fiber supplement. Cooking  Cook meats so they stay tender and moist. Use methods like braising, stewing, or baking in liquid.  Cook vegetables and fruit until they are soft enough to be mashed with a fork.  Peel soft, fresh fruits such as peaches, nectarines, and melons.  When making soup, make sure  chunks of meat and vegetables are smaller than  inch.  Reheat leftover foods slowly so that a tough crust does not form. What foods are allowed? The items listed below may not be a complete list. Talk with your dietitian about what dietary choices are best for you. Grains Breads, muffins, pancakes, or waffles moistened with syrup, jelly, or butter. Dry cereals well-moistened with milk. Moist, cooked cereals. Well-cooked pasta and rice. Vegetables All soft-cooked vegetables. Shredded lettuce. Fruits All canned and cooked fruits. Soft, peeled fresh fruits. Strawberries. Dairy Milk. Cream. Yogurt. Cottage cheese. Soft cheese without the rind. Meats and other protein foods Tender, moist ground meat, poultry, or fish. Meat cooked in gravy or sauces. Eggs. Sweets and desserts Ice cream. Milk shakes. Sherbet. Pudding. Fats and oils Butter. Margarine. Olive, canola, sunflower, and grapeseed oil. Smooth salad dressing. Smooth cream cheese. Mayonnaise. Gravy. What foods are not allowed? The items listed bemay not be a complete list. Talk with your dietitian about what dietary choices are best for you. Grains Coarse or dry cereals, such as bran, granola, and shredded wheat. Tough or chewy crusty breads, such as [place] bread or baguettes. Breads with nuts, seeds, or fruit. Vegetables All raw vegetables. Cooked corn. Cooked vegetables that are tough or stringy. Tough, crisp, fried potatoes and potato skins. Fruits Fresh fruits with skins or seeds, or both, such as apples, pears, and grapes. Stringy, high-pulp fruits, such as papaya, pineapple, coconut, and mango. Fruit leather and all dried fruit. Dairy Yogurt with nuts or coconut. Meats and other protein foods Hard,  dry sausages. Dry meat, poultry, or fish. Meats with gristle. Fish with bones. Fried meat or fish. Lunch meat and hotdogs. Nuts and seeds. Chunky peanut butter or other nut butters. Sweets and desserts Cakes or cookies that are very  dry or chewy. Desserts with dried fruit, nuts, or coconut. Fried pastries. Very rich pastries. Fats and oils Cream cheese with fruit or nuts. Salad dressings with seeds or chunks. Summary  A soft-food eating plan includes foods that are safe and easy to swallow. Generally, the foods should be soft enough to be mashed with a fork.  Avoid foods that are dry, hard to chew, crunchy, sticky, stringy, or crispy.  Ask your health care provider whether you need to thicken your liquids and if you need to take a fiber supplement. This information is not intended to replace advice given to you by your health care provider. Make sure you discuss any questions you have with your health care provider. Document Released: 12/13/2007 Document Revised: 12/27/2018 Document Reviewed: 11/08/2016 Elsevier Patient Education  2020 [employer].

## 2019-06-25 NOTE — Progress Notes (Signed)
Subjective: CC: Patient pulled her NGT out overnight. She reports similar epigastric abdominal cramping/discomfort that is the same as yesterday. Nausea has improved. No emesis. She is tolerating clears. Continues to pass flatus and had a formed BM yesterday.   Objective: Vital signs in last 24 hours: Temp:  [98.5 F (36.9 C)-102.2 F (39 C)] 102.2 F (39 C) (10/06 0700) Pulse Rate:  [104-143] 115 (10/06 0700) Resp:  [21-38] 23 (10/06 0700) BP: (87-148)/(44-100) 113/76 (10/06 0558) SpO2:  [94 %-100 %] 99 % (10/06 0700) Last BM Date: 06/24/19  Intake/Output from previous day: 10/05 0701 - 10/06 0700 In: 1773.8 [I.V.:1127.9; IV Piggyback:645.8] Out: 962 [Urine:962] Intake/Output this shift: No intake/output data recorded.  PE: Gen:  Alert, NAD, pleasant Card:  Tachycardic  Pulm:  CTA b/l, normal rate and effort  Abd: Mild distension but soft, tenderness in the epigastrium without r/r/g or signs of peritonitis. Similar to yesterday. +BS  Ext:  1+ LE edema  Skin: no rashes noted, warm and dry Lab Results:  Recent Labs    06/24/19 0431 06/25/19 0154  WBC 7.4 9.2  HGB 9.5* 8.2*  HCT 30.0* 26.4*  PLT 415* 376   BMET Recent Labs    06/24/19 0431 06/25/19 0154  NA 141 141  K 2.8* 3.9  CL 115* 115*  CO2 18* 18*  GLUCOSE 123* 132*  BUN 17 19  CREATININE 0.55 0.58  CALCIUM 8.2* 7.7*   PT/INR No results for input(s): LABPROT, INR in the last 72 hours. CMP     Component Value Date/Time   NA 141 06/25/2019 0154   K 3.9 06/25/2019 0154   CL 115 (H) 06/25/2019 0154   CO2 18 (L) 06/25/2019 0154   GLUCOSE 132 (H) 06/25/2019 0154   BUN 19 06/25/2019 0154   CREATININE 0.58 06/25/2019 0154   CALCIUM 7.7 (L) 06/25/2019 0154   PROT 4.7 (L) 06/25/2019 0154   ALBUMIN 1.7 (L) 06/25/2019 0154   AST 12 (L) 06/25/2019 0154   ALT 12 06/25/2019 0154   ALKPHOS 70 06/25/2019 0154   BILITOT 0.7 06/25/2019 0154   GFRNONAA >60 06/25/2019 0154   GFRAA >60 06/25/2019 0154    Lipase  No results found for: LIPASE     Studies/Results: Dg Abd 1 View  Result Date: 06/24/2019 CLINICAL DATA:  Nasogastric tube placement EXAM: ABDOMEN - 1 VIEW COMPARISON:  None. FINDINGS: Tip and side port of the nasogastric tube project over the stomach. Nonobstructive bowel gas pattern. IMPRESSION: Nasogastric tube tip in the stomach. Electronically Signed   By: Ulyses Jarred M.D.   On: 06/24/2019 03:36   Dg Abd 1 View  Result Date: 06/23/2019 CLINICAL DATA:  Enteric tube placement EXAM: ABDOMEN - 1 VIEW COMPARISON:  Abdominal radiograph from earlier today FINDINGS: Enteric tube terminates in the distal stomach. Persistent dilated small bowel loops up to the 4.3 cm in the left abdomen. No evidence of pneumatosis or pneumoperitoneum. Small right pleural effusion appears unchanged. Patchy bibasilar lung opacities appear unchanged. No radiopaque nephrolithiasis. IMPRESSION: 1. Enteric tube terminates in the distal stomach. 2. Persistent dilated small bowel loops in the left abdomen compatible with mid to distal small bowel obstruction. 3. Stable small right pleural effusion and patchy bibasilar lung opacities. Electronically Signed   By: Ilona Sorrel M.D.   On: 06/23/2019 11:49    Anti-infectives: Anti-infectives (From admission, onward)   Start     Dose/Rate Route Frequency Ordered Stop   06/23/19 1100  vancomycin (VANCOCIN) IVPB  750 mg/150 ml premix     750 mg 150 mL/hr over 60 Minutes Intravenous Every 12 hours 06/23/19 1025     06/22/19 1600  ceFEPIme (MAXIPIME) 2 g in sodium chloride 0.9 % 100 mL IVPB     2 g 200 mL/hr over 30 Minutes Intravenous Every 8 hours 06/22/19 0921     06/22/19 1000  vancomycin (VANCOCIN) IVPB 1000 mg/200 mL premix  Status:  Discontinued     1,000 mg 200 mL/hr over 60 Minutes Intravenous Every 36 hours 06/20/19 2048 06/20/19 2049   06/22/19 0600  vancomycin (VANCOCIN) IVPB 1000 mg/200 mL premix  Status:  Discontinued     1,000 mg 200 mL/hr over 60  Minutes Intravenous Every 36 hours 06/20/19 2049 06/21/19 1322   06/21/19 2100  vancomycin (VANCOCIN) 1,250 mg in sodium chloride 0.9 % 250 mL IVPB  Status:  Discontinued     1,250 mg 166.7 mL/hr over 90 Minutes Intravenous Every 24 hours 06/21/19 1322 06/23/19 1025   06/21/19 0900  ceFEPIme (MAXIPIME) 2 g in sodium chloride 0.9 % 100 mL IVPB  Status:  Discontinued     2 g 200 mL/hr over 30 Minutes Intravenous Every 12 hours 06/20/19 2048 06/22/19 0921   06/20/19 2030  ceFEPIme (MAXIPIME) 2 g in sodium chloride 0.9 % 100 mL IVPB     2 g 200 mL/hr over 30 Minutes Intravenous  Once 06/20/19 2016 06/20/19 2134   06/20/19 2030  metroNIDAZOLE (FLAGYL) IVPB 500 mg     500 mg 100 mL/hr over 60 Minutes Intravenous  Once 06/20/19 2016 06/20/19 2249   06/20/19 2030  vancomycin (VANCOCIN) IVPB 1000 mg/200 mL premix     1,000 mg 200 mL/hr over 60 Minutes Intravenous  Once 06/20/19 2016 06/20/19 2143       Assessment/Plan Hx PE - Xarelto on Hold  AKI - Resolved Hx Cauda Equina Syndrome Anemia - S/p 1U 10/4. 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

## 2019-06-25 NOTE — Progress Notes (Addendum)
ANTICOAGULATION CONSULT NOTE   Pharmacy Consult for heparin  Indication: hx pulmonary embolus (holding xarelto)  No Known Allergies  Patient Measurements: Height: 5\' 4"  (162.6 cm) Weight: 140 lb (63.5 kg) IBW/kg (Calculated) : 54.7 Heparin Dosing Weight: 63 kg  Vital Signs: Temp: 102.2 F (39 C) (10/06 0700) Temp Source: Axillary (10/06 0000) BP: 113/76 (10/06 0558) Pulse Rate: 115 (10/06 0700)  Labs: Recent Labs    06/23/19 0508  06/23/19 1520 06/24/19 0431 06/25/19 0154 06/25/19 0553  HGB 6.8*  --   --  9.5* 8.2*  --   HCT 21.8*  --   --  30.0* 26.4*  --   PLT 351  --   --  415* 376  --   APTT  --    < > 86* 94*  --  150*  HEPARINUNFRC  --    < > 1.15* 0.94* 0.86*  --   CREATININE 0.59  --   --  0.55 0.58  --    < > = values in this interval not displayed.    Estimated Creatinine Clearance: 70.2 mL/min (by C-G formula based on SCr of 0.58 mg/dL).   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 Abd CT on 10/2 showed findings consistent with distal SBO. CCS recommends NG tube for decompression and bowel rest.  Pharmacy was consulted on 10/3 to transition patient from [place] to heparin drip on 10/3.  Today, 06/25/2019:   0553 aPTT and heparin level supratherapeutic on 1000 units/hr  CBC: Hgb 8.2,  Plt stable WNL  No bleeding noted  1319 aPTT supratherapeutic at 183, heparin infusing in port, lab drew peripherally  Goal of Therapy:  Heparin level 0.3-0.7 units/ml  APTT 66-102 sec Monitor platelets by anticoagulation protocol: Yes   Plan:   Hold heparin x 1 hour then decrease 800 units/hr  APTT and HL in 6 hours  Daily heparin/ aPTT level and CBC  Monitor for signs of bleeding or thrombosis  F/u ability to resume [place] 06/25/2019, 7:55 AM Pager [phone]

## 2019-06-25 NOTE — Progress Notes (Signed)
Patient reports that she is tolerating full liquids and some ensure without any emesis. She continues to pass flatus and has had 2+ BM's in the last 48 hours. No indication for surgery. General surgery will sign off. Patients diet can be advanced as tolerated. Please call back with any questions or concerns.

## 2019-06-26 ENCOUNTER — Other Ambulatory Visit: Payer: Self-pay

## 2019-06-26 DIAGNOSIS — Z515 Encounter for palliative care: Secondary | ICD-10-CM

## 2019-06-26 DIAGNOSIS — C50919 Malignant neoplasm of unspecified site of unspecified female breast: Secondary | ICD-10-CM

## 2019-06-26 DIAGNOSIS — Z7189 Other specified counseling: Secondary | ICD-10-CM

## 2019-06-26 DIAGNOSIS — C7970 Secondary malignant neoplasm of unspecified adrenal gland: Secondary | ICD-10-CM

## 2019-06-26 DIAGNOSIS — C7951 Secondary malignant neoplasm of bone: Secondary | ICD-10-CM

## 2019-06-26 DIAGNOSIS — R509 Fever, unspecified: Secondary | ICD-10-CM

## 2019-06-26 LAB — GLUCOSE, CAPILLARY
Glucose-Capillary: 103 mg/dL — ABNORMAL HIGH (ref 70–99)
Glucose-Capillary: 112 mg/dL — ABNORMAL HIGH (ref 70–99)
Glucose-Capillary: 113 mg/dL — ABNORMAL HIGH (ref 70–99)
Glucose-Capillary: 117 mg/dL — ABNORMAL HIGH (ref 70–99)
Glucose-Capillary: 119 mg/dL — ABNORMAL HIGH (ref 70–99)
Glucose-Capillary: 145 mg/dL — ABNORMAL HIGH (ref 70–99)

## 2019-06-26 LAB — CBC
HCT: 27.8 % — ABNORMAL LOW (ref 36.0–46.0)
Hemoglobin: 8.4 g/dL — ABNORMAL LOW (ref 12.0–15.0)
MCH: 27.5 pg (ref 26.0–34.0)
MCHC: 30.2 g/dL (ref 30.0–36.0)
MCV: 90.8 fL (ref 80.0–100.0)
Platelets: 384 10*3/uL (ref 150–400)
RBC: 3.06 MIL/uL — ABNORMAL LOW (ref 3.87–5.11)
RDW: 19.9 % — ABNORMAL HIGH (ref 11.5–15.5)
WBC: 13 10*3/uL — ABNORMAL HIGH (ref 4.0–10.5)
nRBC: 0.2 % (ref 0.0–0.2)

## 2019-06-26 LAB — PROCALCITONIN: Procalcitonin: <0.1 ng/mL

## 2019-06-26 LAB — HEPARIN LEVEL (UNFRACTIONATED): Heparin Unfractionated: 0.63 IU/mL (ref 0.30–0.70)

## 2019-06-26 MED ORDER — ENSURE ENLIVE PO LIQD
237.0000 mL | ORAL | Status: DC
  Administered 2019-06-26 – 2019-07-03 (×2): 237 mL via ORAL

## 2019-06-26 MED ORDER — ACETAMINOPHEN 325 MG PO TABS: 650.0000 mg | ORAL_TABLET | Freq: Four times a day (QID) | ORAL | Status: DC | PRN

## 2019-06-26 MED ORDER — ADULT MULTIVITAMIN W/MINERALS CH
1.0000 | ORAL_TABLET | Freq: Every day | ORAL | Status: DC
  Administered 2019-06-26 – 2019-07-05 (×9): 1 via ORAL
  Filled 2019-06-26 (×10): qty 1

## 2019-06-26 MED ORDER — ACETAMINOPHEN 325 MG PO TABS
650.0000 mg | ORAL_TABLET | Freq: Four times a day (QID) | ORAL | Status: DC | PRN
  Administered 2019-06-26 – 2019-07-03 (×8): 650 mg via ORAL
  Filled 2019-06-26 (×9): qty 2

## 2019-06-26 MED ORDER — ACETAMINOPHEN 650 MG RE SUPP: 650.0000 mg | Freq: Four times a day (QID) | RECTAL | Status: DC | PRN

## 2019-06-26 MED ORDER — METOPROLOL TARTRATE 5 MG/5ML IV SOLN
2.5000 mg | Freq: Four times a day (QID) | INTRAVENOUS | Status: DC | PRN
  Administered 2019-06-26 – 2019-07-01 (×13): 2.5 mg via INTRAVENOUS
  Filled 2019-06-26 (×13): qty 5

## 2019-06-26 MED ORDER — PRO-STAT SUGAR FREE PO LIQD
30.0000 mL | Freq: Every day | ORAL | Status: DC
  Administered 2019-06-26 – 2019-07-04 (×4): 30 mL via ORAL
  Filled 2019-06-26 (×4): qty 30

## 2019-06-26 MED ORDER — BOOST / RESOURCE BREEZE PO LIQD CUSTOM
1.0000 | ORAL | Status: DC
  Administered 2019-06-26 – 2019-07-04 (×3): 1 via ORAL

## 2019-06-26 NOTE — Progress Notes (Signed)
ANTICOAGULATION CONSULT NOTE - Follow Up Consult  Pharmacy Consult for Heparin Indication: hx pulmonary embolus (holding xarelto)  No Known Allergies  Patient Measurements: Height: 5\' 4"  (162.6 cm) Weight: 140 lb (63.5 kg) IBW/kg (Calculated) : 54.7 Heparin Dosing Weight:   Vital Signs: Temp: 99.1 F (37.3 C) (10/07 0028) Temp Source: Bladder (10/06 2031) BP: 106/63 (10/07 0027) Pulse Rate: 98 (10/07 0028)  Labs: Recent Labs    06/23/19 0508  06/24/19 0431 06/25/19 0154 06/25/19 0553 06/25/19 1319 06/25/19 2042  HGB 6.8*  --  9.5* 8.2*  --   --   --   HCT 21.8*  --  30.0* 26.4*  --   --   --   PLT 351  --  415* 376  --   --   --   APTT  --    < > 94*  --  150* 183* 131*  HEPARINUNFRC  --    < > 0.94* 0.86*  --  0.52 0.60  CREATININE 0.59  --  0.55 0.58  --   --   --    < > = values in this interval not displayed.    Estimated Creatinine Clearance: 70.2 mL/min (by C-G formula based on SCr of 0.58 mg/dL).   Medications:  Infusions:  . dextrose 5 % and 0.45 % NaCl with KCl 20 mEq/L 100 mL/hr at 06/25/19 2104  . heparin 800 Units/hr (06/25/19 1511)    Assessment: Patient with heparin level at goal.  PTT above goal.  PTT ordered with Heparin level until both correlate due to possible drug-lab interaction between oral anticoagulant (rivaroxaban, edoxaban, or apixaban) and anti-Xa level (aka heparin level)   Will only use heparin level going forward.  Goal of Therapy:  Heparin level 0.3-0.7 units/ml Monitor platelets by anticoagulation protocol: Yes   Plan:  Continue heparin drip at current rate Recheck level with AM labs  Tyler Deis, [facility] [place] 06/26/2019,3:12 AM

## 2019-06-26 NOTE — Progress Notes (Addendum)
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 Patient recently was diagnosed with E. coli UTI which was pansensitive and was treated with ciprofloxacin. Patient  Also reported abdominal pain. Hospital course complicated by persistent fevers with no clear source, AKI, anemia requiring blood transfusion and SBO in the setting of peritoneal carcinomatosis prompting GS consult-managed medically. Initially started on vancomycin and cefepime for presumed sepsis.  Unclear source of infection.Patient completed 5 days of antibiotic therapy.  Blood cultures / Urine culture showed no growth. Now felt to be drug induced fever, being monitored off antibiotics. Mental status has improved.   Subjective:  Patient resting comfortably when seen earlier this morning.  She reports passing gas.  Denies any abdominal pain or bloating.  Tolerating full liquid diet well and feels ready for advancing diet although has a low appetite.   Objective: Vitals:   06/26/19 0026 06/26/19 0027 06/26/19 0028 06/26/19 0524  BP:  106/63  99/62  Pulse: 82 97 98 83  Resp: (!) 22 (!) 30 (!) 27 20  Temp: 99.1 F (37.3 C) 99.1 F (37.3 C) 99.1 F (37.3 C) 99.3 F (37.4 C)  TempSrc:      SpO2: 100% (!) 82% 99% 100%  Weight:      Height:        Intake/Output Summary (Last 24 hours) at 06/26/2019 0742 Last data filed at 06/26/2019 0600 Gross per 24 hour  Intake 3047.53 ml  Output 400 ml  Net 2647.53 ml   Filed Weights   06/20/19 2014  Weight: 63.5 kg     Physical Examination:  General exam: Appears tired and weak but no respiratory distress and comfortable  Respiratory system: Clear to auscultation. Respiratory effort normal. Cardiovascular system: S1 & S2 heard. No JVD, murmurs.  1+ pedal edema. Gastrointestinal system: Abdomen is nondistended, soft and nontender. No organomegaly or masses felt. Normal bowel sounds heard. Central nervous system: Alert and oriented. No focal neurological deficits.  Generalized weakness with decreased strength 4/5 in upper extremities, 2/5 in lower extremities right > left Skin: No rashes, lesions or ulcers Psychiatry: Oriented x3, judgement and insight appear normal. Mood & affect flat.    Data Reviewed: I have personally reviewed following labs and imaging studies  CBC: Recent Labs  Lab 06/20/19 2000  06/22/19 0525 06/23/19 0508 06/24/19 0431 06/25/19 0154 06/26/19 0504  WBC 7.7   < > 9.9 7.7 7.4 9.2 13.0*  NEUTROABS 6.0  --   --   --   --   --   --   HGB 8.1*   < > 7.2* 6.8* 9.5* 8.2* 8.4*  HCT 25.6*   < > 23.3* 21.8* 30.0* 26.4* 27.8*  MCV 86.8   < > 88.6 87.9 87.0 87.4 90.8  PLT 456*   < > 386 351 415* 376 384   < > = values in this interval not displayed.   Basic Metabolic Panel: Recent Labs  Lab 06/21/19 0523 06/22/19 0525 06/23/19 0508 06/24/19 0431 06/24/19 0954 06/25/19 0154  NA 135 136 139 141  --  141  K 3.2* 3.2* 3.1* 2.8*  --  3.9  CL 103 108 114* 115*  --  115*  CO2 22 20* 21* 18*  --  18*  GLUCOSE 86 113* 135* 123*  --  132*  BUN 17 19 18 17   --  19  CREATININE 1.01* 0.78 0.59 0.55  --  0.58  CALCIUM 8.2* 8.0* 8.0* 8.2*  --  7.7*  MG  --   --  1.8  --  1.6*  --   PHOS  --   --  2.0*  --   --   --    GFR: Estimated Creatinine Clearance: 70.2 mL/min (by C-G formula based on SCr of 0.58 mg/dL). Liver Function Tests: Recent Labs  Lab 06/20/19 2000 06/22/19 0525 06/24/19 0431 06/25/19 0154  AST 20 15 14* 12*  ALT 24 18 18 12   ALKPHOS 89 82 84 70  BILITOT  0.6 0.3 0.4 0.7  PROT 5.7* 5.2* 5.2* 4.7*  ALBUMIN 2.6* 2.1* 2.0* 1.7*   No results for input(s): LIPASE, AMYLASE in the last 168 hours. No results for input(s): AMMONIA in the last 168 hours. Coagulation Profile: Recent Labs  Lab 06/20/19 2000  INR 2.7*   Cardiac Enzymes: No results for input(s): CKTOTAL, CKMB, CKMBINDEX, TROPONINI in the last 168 hours. BNP (last 3 results) No results for input(s): PROBNP in the last 8760 hours. HbA1C: No results for input(s): HGBA1C in the last 72 hours. CBG: Recent Labs  Lab 06/25/19 1244 06/25/19 1511 06/25/19 2002 06/25/19 2325 06/26/19 0318  GLUCAP 151* 133* 118* 127* 119*   Lipid Profile: No results for input(s): CHOL, HDL, LDLCALC, TRIG, CHOLHDL, LDLDIRECT in the last 72 hours. Thyroid Function Tests: No results for input(s): TSH, T4TOTAL, FREET4, T3FREE, THYROIDAB in the last 72 hours. Anemia Panel: No results for input(s): VITAMINB12, FOLATE, FERRITIN, TIBC, IRON, RETICCTPCT in the last 72 hours. Sepsis Labs: Recent Labs  Lab 06/20/19 2000 06/20/19 2142 06/21/19 0523 06/21/19 0528 06/25/19 1319 06/26/19 0504  PROCALCITON  --   --  3.20  --  1.16 <0.10  LATICACIDVEN 2.3* 2.4*  --  1.1  --   --     Recent Results (from the past 240 hour(s))  Culture, blood (Routine x 2)     Status: None   Collection Time: 06/20/19  8:00 PM   Specimen: BLOOD  Result Value Ref Range Status   Specimen Description   Final    BLOOD CHEST PORTA CATH Performed at [facility], [address] [place], [place] [address]    Special Requests   Final    BOTTLES DRAWN AEROBIC AND ANAEROBIC Blood Culture adequate volume Performed at [facility], [address] [place], [place] [address]    Culture   Final    NO GROWTH 5 DAYS Performed at [facility], [place] [address] [place] [address], [place], [place] [address]    Report Status 06/25/2019 FINAL  Final  Culture, blood (Routine x 2)     Status: None   Collection Time: 06/20/19   8:10 PM   Specimen: BLOOD  Result Value Ref Range Status   Specimen Description   Final    BLOOD RIGHT ANTECUBITAL Performed at [facility], [address] [place], [place] [address]    Special Requests   Final    BOTTLES DRAWN AEROBIC AND ANAEROBIC Blood Culture adequate volume Performed at [facility], [address] [place], [place] [address]    Culture   Final  NO GROWTH 5 DAYS Performed at [facility], [place] [address], [place], [place] [address]    Report Status 06/25/2019 FINAL  Final  SARS Coronavirus 2 [facility] order, Performed in [facility] hospital lab) Nasopharyngeal Nasopharyngeal Swab     Status: None   Collection Time: 06/20/19  8:19 PM   Specimen: Nasopharyngeal Swab  Result Value Ref Range Status   SARS Coronavirus 2 NEGATIVE NEGATIVE Final    Comment: (NOTE) If result is NEGATIVE SARS-CoV-2 target nucleic acids are NOT DETECTED. The SARS-CoV-2 RNA is generally detectable in upper and lower  respiratory specimens during the acute phase of infection. The lowest  concentration of SARS-CoV-2 viral copies this assay can detect is 250  copies / mL. A negative result does not preclude SARS-CoV-2 infection  and should not be used as the sole basis for treatment or other  patient management decisions.  A negative result may occur with  improper specimen collection / handling, submission of specimen other  than nasopharyngeal swab, presence of viral mutation(s) within the  areas targeted by this assay, and inadequate number of viral copies  (<250 copies / mL). A negative result must be combined with clinical  observations, patient history, and epidemiological information. If result is POSITIVE SARS-CoV-2 target nucleic acids are DETECTED. The SARS-CoV-2 RNA is generally detectable in upper and lower  respiratory specimens dur ing the acute phase of infection.  Positive  results are indicative of active infection with SARS-CoV-2.   Clinical  correlation with patient history and other diagnostic information is  necessary to determine patient infection status.  Positive results do  not rule out bacterial infection or co-infection with other viruses. If result is PRESUMPTIVE POSTIVE SARS-CoV-2 nucleic acids MAY BE PRESENT.   A presumptive positive result was obtained on the submitted specimen  and confirmed on repeat testing.  While 2019 novel coronavirus  (SARS-CoV-2) nucleic acids may be present in the submitted sample  additional confirmatory testing may be necessary for epidemiological  and / or clinical management purposes  to differentiate between  SARS-CoV-2 and other Sarbecovirus currently known to infect humans.  If clinically indicated additional testing with an alternate test  methodology [phone]) is advised. The SARS-CoV-2 RNA is generally  detectable in upper and lower respiratory sp ecimens during the acute  phase of infection. The expected result is Negative. Fact Sheet for Patients:  StrictlyIdeas.no Fact Sheet for Healthcare Providers: BankingDealers.co.za This test is not yet approved or cleared by the [place] FDA and has been authorized for detection and/or diagnosis of SARS-CoV-2 by FDA under an Emergency Use Authorization (EUA).  This EUA will remain in effect (meaning this test can be used) for the duration of the COVID-19 declaration under Section 564(b)(1) of the Act, 21 U.S.C. section 360bbb-3(b)(1), unless the authorization is terminated or revoked sooner. Performed at [facility], [address] [place], [place] [address]   Urine culture     Status: None   Collection Time: 06/20/19  8:32 PM   Specimen: In/Out Cath Urine  Result Value Ref Range Status   Specimen Description   Final    IN/OUT CATH URINE Performed at [facility], [address] [place], [place] [address]    Special Requests   Final     NONE Performed at [facility], [address] [place], [place] [address]    Culture   Final    NO GROWTH Performed at [facility], [address] [place], [place] [address]    Report Status 06/21/2019 FINAL  Final  MRSA PCR Screening     Status: None   Collection Time: 06/21/19  3:53 AM   Specimen: Nasal Mucosa; Nasopharyngeal  Result Value Ref Range Status   MRSA by PCR NEGATIVE NEGATIVE Final    Comment:        The GeneXpert MRSA Assay (FDA approved for NASAL specimens only), is one component of a comprehensive MRSA colonization surveillance program. It is not intended to diagnose MRSA infection nor to guide or monitor treatment for MRSA infections. Performed at [facility], [place] [address], [place], [place] [address]       Radiology Studies: No results found.      Scheduled Meds:  chlorhexidine  15 mL Mouth Rinse BID   Chlorhexidine Gluconate Cloth  6 each Topical Daily   famotidine  20 mg Oral Daily   feeding supplement (ENSURE ENLIVE)  237 mL Oral BID BM   gabapentin  100 mg Oral TID   mouth rinse  15 mL Mouth Rinse q12n4p   mirtazapine  7.5 mg Oral QHS   sodium chloride flush  10-40 mL Intracatheter Q12H   tiZANidine  4 mg Oral TID   Continuous Infusions:  dextrose 5 % and 0.45 % NaCl with KCl 20 mEq/L 100 mL/hr at 06/26/19 0657   heparin 800 Units/hr (06/25/19 1511)    Assessment & Plan:    1.  Small bowel obstruction: CT abdomen on admission revealed SBO with metastatic disease/peritoneal carcinomatosis.  Seen by general surgery and managed conservatively.  She did have bowel movements during the hospital course, tolerating liquid diet and general surgery has signed off with recommendations to advance diet as tolerated.  Patient feels ready for soft diet although reports poor appetite.  No abdominal distention or tenderness noted on exam.  2.  Fever of unclear etiology: Procalcitonin at 3.2 on  presentation-> 1.16--> now <0.10.  Urine cultures and blood cultures so far negative.  Patient started on vancomycin and cefepime empirically on admission which have been discontinued now in concern for possible drug-induced fever.  Patient afebrile overnight but spiked temp this afternoon.  We will continue to monitor off antibiotics.  Procalcitonin levels have come down, no white count.  Will repeat blood cultures and consult ID depending on palliative care discussions.  May need CSF evaluation.  3.  Metabolic encephalopathy: Present on admission in the setting of problem #1 and 2.  Given diffuse metastasis, leptomeningitis could be a consideration.  Patient however mentating well now.  Currently off antibiotics.  May need CSF evaluation as discussed above.  Will continue to monitor.  4.  Breast cancer with diffuse metastasis-peritoneal carcinomatosis/spinal cord/adrenal mets: Patient was following [facility] oncology and was undergoing chemotherapy-last session a week prior to admission.  Patient this morning expressed wishes to avoid aggressive interventions and leaning towards palliative care.  She does not want any aggressive interventions including resuscitative measures but would like to discuss with her husband before final decision.  I have informally discussed with oncology NP.  Also requested a consult and discussed with palliative care MD who plans to meet with patient/family today.  5.  History of pulmonary embolism: On Xarelto at home--> currently on heparin given SBO/n.p.o. status on admission.  Will transition back to Xarelto if no procedures planned  6.  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  Patient has bilateral leg weakness right more than left.  Foley catheter has been placed.  7.  Acute on chronic anemia: S/p blood transfusion.  Hemoglobin improved to 8.2  8.  Sacral decubitus stage II: Local wound care  9.  FEN-severe PCM: Patient continues to have  poor appetite and barely ate 20% of her meals.  She feels she might do better with soft diet.  She states she does not want feeding tubes.  Her albumin level is less than 2 and likely contributing to her lower extremity edema/moderate ascitis.  DVT prophylaxis: On heparin IV Code Status: Remains full code as of now Family / Patient Communication: Discussed with patient earlier today.  Will also discuss with husband Disposition Plan: Home with home health services per case management note  Addendum : Discussed with husband Mamie Hundertmark who expressed his wish for continuing aggressive care and stated that he spoke to [facility] oncology this morning and was advised to have oncology evaluation while here for emergent needs.  He states patient underwent surgery for spinal met and there is "no more cancer in her spine".  He is requesting ID evaluation (which I have agreed to and placed a consult) as well as oncology evaluation (I have recommended outpatient follow-up with [facility] unless emergent indication) while here.  He is open for palliative care discussions and states patient was referred to palliative care evaluation at [facility] as well.  I have encouraged him to consider patient's wishes and serve as healthcare proxy in patient's best interest while discussing with palliative care team.  Unfortunately Ms. Miao has very poor prognosis and is getting tired of multiple interventions.   LOS: 6 days    Time spent: [place]    Guilford Shi, MD [facility] Pager [phone]  If 7PM-7AM, please contact night-coverage [email or web address] Password [email or web address] 06/26/2019, 7:42 AM

## 2019-06-26 NOTE — Consult Note (Addendum)
Consultation Note Date: 06/26/2019   Patient Name: Kaylee Randolph  DOB: 11-22-1965  MRN: [id number]  Age / Sex: 53 y.o., female  PCP: Kaylee Dus, MD Referring Physician: Guilford Shi, MD  Reason for Consultation: Establishing goals of care  HPI/Patient Profile: 53 y.o. 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  She has unclear source of fever and has completed 5 days antibiotic therapy.  Blood and urine cultures did not show growth.  ID consulted today.  Palliative consulted for goals of care.  Of note, she does follow with palliative care at [facility] where she receives her chemotherapy.  Last note reviewed there reveals visit focused on symptom management rather than advance care planning.  Clinical Assessment and Goals of Care: Chart reviewed including personal review of pertinent labs and imaging.  Discussed case with bedside staff including her RN and NT.  Of note, patient had episode of unresponsiveness and inability to find pulse.  When 2 chest compressions were done, patient regained consciousness and stated she did not want resuscitative attempts.  I met at the bedside with patient, RN, and nurse tech.   She is overwhelmed during encounter but reports that she finds the most joy in being able to have all of garden takeout with her husband and son.  She has been married for over 20 years and has 3 children.  She also has a grandchild who is less than 16-year-old.  She has worked as a Therapist, art in the past.  She reports having a difficult time with continued burdens of being chronically ill and having to undergo  multiple medical procedures and interventions.  She becomes tearful and states she is "just so tired."  We discussed options for care moving forward including continuation of current aggressive interventions, continuing current interventions with limitations on escalation of care (such as no attempts at resuscitation in the event of cardiac or respiratory arrest), and discontinuation of therapies with focus on comfort.  She states that she would like to continue with work-up for potential infection and we discussed a plan to continue with current interventions while waiting to see results of this work-up.  I discussed with her regarding limitations of care with recommendation for consideration of establishing a DO NOT RESUSCITATE order based upon her stated preference not to undergo resuscitation.  While she agrees that this is not something that she would want, she hesitates and states that she does not know that she wants to put this order in place "right now."  Her husband then reentered the room and we again reviewed her stated wishes of continuation of current care with the understanding that overall her condition is continuing and will continue to decline.  Her husband states that he understands that she has a poor prognosis and he also understands my  concern that heroic interventions such as CPR or ventilator support in the event of cardiac or respiratory arrest have little chance of resulting in her ever becoming well enough to leave the hospital.  He states that this is something that he would like to discuss further with their children.  I met with Kaylee Randolph at third time when I noted that Dr. Earnest Randolph was in the room speaking with her.  Her husband was not present at this time.  She did not recall meeting with me earlier despite is having met for nearly an hour less than 20 minutes before this third encounter.  She again expressed her desire to not have aggressive interventions such as CPR or  intubation, but remained noncommittal when asked directly if this is something that we should change.  She indicated that while she would not want CPR or intubation "when that time comes."  At this time, however, she views this is something that would occur in the future and expressed hesitancy to actually take the step of changing CODE STATUS at this point.  SUMMARY OF RECOMMENDATIONS   - Patient has expressed that she does not want to have aggressive interventions in the event of cardiac or respiratory arrest.  At the same time, she expressed hesitancy about changing her CODE STATUS at this particular moment.  I participated in multiple conversations with her and her husband today regarding this.  Her husband would like to discuss further with her children and is agreeable to readdressing again tomorrow.  Both patient and her husband were informed that until the changes made, the default plan would be for aggressive interventions including full CODE STATUS.  This is further complicated by patient's poor memory as she did not recall a 1 hour conversation that I had with her and her husband less than 20 minutes after concluded.  I discussed with Dr. Earnest Randolph and we will continue to work to reach a point where patient and her family are congruent on goals moving forward.  Based upon her clinical condition and expressed wishes, I have recommended to her husband consideration for changing CODE STATUS to do not attempt resuscitation in the event of cardiac or respiratory arrest.  Code Status/Advance Care Planning:  Full code   Palliative Prophylaxis:   Aspiration, Bowel Regimen, Delirium Protocol and Frequent Pain Assessment  Additional Recommendations (Limitations, Scope, Preferences):  Full Scope Treatment  Psycho-social/Spiritual:   Desire for further Chaplaincy support:Did not address today  Additional Recommendations: Caregiving  Support/Resources and Education on Hospice  Prognosis:    Guarded  Discharge Planning: To Be Determined      Primary Diagnoses: Present on Admission: . Hypertension . 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 Acute metabolic encephalopathy . Hyponatremia . AKI (acute kidney injury) ([place]) . Sepsis ([place]) . Normocytic anemia . Cauda equina compression ([place]) . PE (pulmonary thromboembolism) ([place]) . Sacral decubitus ulcer, stage II ([place])   I have reviewed the medical record, interviewed the patient and family, and examined the patient. The following aspects are pertinent.  Past Medical History:  Diagnosis Date  . Cancer ([place])   . Hypertension    Social History   Socioeconomic History  . Marital status: Married    Spouse name: Not on file  . Number of children: Not on file  . Years of education: Not on file  . Highest education level: Not on file  Occupational History  . Not on file  Social Needs  . Financial resource strain: Not on  file  . Food insecurity    Worry: Not on file    Inability: Not on file  . Transportation needs    Medical: Not on file    Non-medical: Not on file  Tobacco Use  . Smoking status: Never Smoker  . Smokeless tobacco: Never Used  Substance and Sexual Activity  . Alcohol use: Not Currently  . Drug use: Never  . Sexual activity: Never  Lifestyle  . Physical activity    Days per week: Not on file    Minutes per session: Not on file  . Stress: Not on file  Relationships  . Social Herbalist on phone: Not on file    Gets together: Not on file    Attends religious service: Not on file    Active member of club or organization: Not on file    Attends meetings of clubs or organizations: Not on file    Relationship status: Not on file  Other Topics Concern  . Not on file  Social History Narrative  . Not on file   Family History  Problem Relation Age of Onset  . Huntington's disease Mother   . Huntington's disease Sister    Scheduled Meds: . chlorhexidine  15 mL Mouth  Rinse BID  . Chlorhexidine Gluconate Cloth  6 each Topical Daily  . famotidine  20 mg Oral Daily  . feeding supplement  1 Container Oral Q24H  . feeding supplement (ENSURE ENLIVE)  237 mL Oral Q24H  . feeding supplement (PRO-STAT SUGAR FREE 64)  30 mL Oral Daily  . gabapentin  100 mg Oral TID  . mouth rinse  15 mL Mouth Rinse q12n4p  . mirtazapine  7.5 mg Oral QHS  . multivitamin with minerals  1 tablet Oral Daily  . sodium chloride flush  10-40 mL Intracatheter Q12H  . tiZANidine  4 mg Oral TID   Continuous Infusions: . dextrose 5 % and 0.45 % NaCl with KCl 20 mEq/L 100 mL/hr at 06/26/19 2014  . heparin 800 Units/hr (06/26/19 2013)   PRN Meds:.acetaminophen **OR** acetaminophen, HYDROmorphone (DILAUDID) injection, hyoscyamine, LORazepam, metoprolol tartrate, ondansetron **OR** ondansetron (ZOFRAN) IV, phenol, promethazine, sodium chloride flush Medications Prior to Admission:  Prior to Admission medications   Medication Sig Start Date End Date Taking? Authorizing Provider  acetaminophen (TYLENOL) 500 MG tablet Take 1,000 mg by mouth every 6 (six) hours as needed for mild pain.   Yes [provider]  ciprofloxacin (CIPRO) 500 MG tablet Take 500 mg by mouth 2 (two) times daily.   Yes [provider]  dexamethasone (DECADRON) 2 MG tablet Take 0.5-2 mg by mouth.    Yes [provider]  famotidine (PEPCID) 20 MG tablet Take 20 mg by mouth daily.   Yes [provider]  gabapentin (NEURONTIN) 100 MG capsule Take 100 mg by mouth 3 (three) times daily.   Yes [provider]  hydrochlorothiazide (MICROZIDE) 12.5 MG capsule Take 12.5 mg by mouth daily.   Yes [provider]  hyoscyamine (LEVSIN) 0.125 MG tablet Take 0.125-0.25 mg by mouth every 4 (four) hours as needed for cramping.   Yes [provider]  losartan (COZAAR) 100 MG tablet Take 100 mg by mouth daily.   Yes [provider]  mirtazapine (REMERON) 7.5 MG tablet Take  7.5 mg by mouth at bedtime.   Yes [provider]  ondansetron (ZOFRAN) 4 MG tablet Take 4 mg by mouth every 6 (six) hours as needed for nausea  or vomiting.   Yes [provider]  oxyCODONE (OXYCONTIN) 10 mg 12 hr tablet Take 10 mg by mouth 2 (two) times daily.   Yes [provider]  pravastatin (PRAVACHOL) 40 MG tablet Take 40 mg by mouth daily.   Yes [provider]  rivaroxaban (XARELTO) 20 MG TABS tablet Take 20 mg by mouth daily.   Yes [provider]  tiZANidine (ZANAFLEX) 4 MG tablet Take 4 mg by mouth 3 (three) times daily.   Yes [provider]  capecitabine (XELODA) 500 MG tablet Take 500 mg by mouth. 05/29/19   [provider]   No Known Allergies Review of Systems Reports anxious and overwhelmed.  Physical Exam General: Alert, awake, in no acute distress.  HEENT: No bruits, no goiter, no JVD Heart: Regular rate and rhythm. No murmur appreciated. Lungs: Good air movement, clear Abdomen: Soft, nontender, nondistended, positive bowel sounds.  Ext: No significant edema Skin: Warm and dry Neuro: LE weakness  Vital Signs: BP (!) 142/95 (BP Location: Right Arm)   Pulse (!) 112   Temp (!) 100.6 F (38.1 C) (Bladder)   Resp 20   Ht _0  (1.626 m)   Wt 63.5 kg   SpO2 96%   BMI 24.03 kg/m  Pain Scale: 0-10 POSS *See Group Information*: S-Acceptable,Sleep, easy to arouse Pain Score: Asleep   SpO2: SpO2: 96 % O2 Device:SpO2: 96 % O2 Flow Rate: .   IO: Intake/output summary:   Intake/Output Summary (Last 24 hours) at 06/26/2019 2222 Last data filed at 06/26/2019 1100 Gross per 24 hour  Intake 1353.6 ml  Output 200 ml  Net 1153.6 ml    LBM: Last BM Date: 06/25/19 Baseline Weight: Weight: 63.5 kg Most recent weight: Weight: 63.5 kg     Palliative Assessment/Data:   Flowsheet Rows     Most Recent Value  Intake Tab  Referral Department  Hospitalist  Unit at Time of Referral  ICU  Palliative Care  Primary Diagnosis  Cancer  Date Notified  06/26/19  Palliative Care Type  New Palliative care  Reason for referral  Clarify Goals of Care  Date of Admission  06/20/19  Date first seen by Palliative Care  06/26/19  # of days Palliative referral response time  0 Day(s)  # of days IP prior to Palliative referral  6  Clinical Assessment  Palliative Performance Scale Score  40%  Psychosocial & Spiritual Assessment  Palliative Care Outcomes  Patient/Family meeting held?  Yes  Who was at the meeting?  Patient, family      Time In: 1750 Time Out: 1900 Time Total: 17 Greater than 50%  of this time was spent counseling and coordinating care related to the above assessment and plan.  Signed by: Micheline Rough, MD   Please contact Palliative Medicine Team phone at [phone] for questions and concerns.  For individual provider: See Shea Evans

## 2019-06-26 NOTE — Progress Notes (Signed)
Pt. Husband Saralyn Pilar) spoke to their personal Oncologist at [facility]; Dr. Wetzel Bjornstad. Per this physician they recommend Oncologist Nicholas Lose, if available, to be the oncologist to consult on patients care. Husband is kindly requesting if possible that this be allowed for his wife's care. Will update physician on family request.

## 2019-06-26 NOTE — Significant Event (Signed)
Informed by nurse that [place] was called around 5:40 PM when patient had a brief episode of unresponsiveness.  (Of note, CODE BLUE overhead was called to room 1223 although this patient is in room 1222).  Nurse apparently went to the room to give medication but patient did not respond to verbal stimuli.  Nurse checked pulse and could not palpate a pulse, CODE BLUE was called and nurse started CPR.  After 2 compressions patient woke up and stated "why did you do that?  I want to die".  Per nurse monitor at that time showing blood pressure 130/80.  Heart rate has been in 110s.  Palliative care MD did speak at length with patient and husband at bedside after the event.  Shortly after this meeting, I came to check on patient who is awake alert oriented x3.  She did not recollect meeting palliative care MD and did not know for sure if her husband came by today.  She however continued to express her wish to die in peace and did not want aggressive interventions like CPR or intubation.  I also met with husband who had just arrived back on the floor and with palliative care MD.  Husband slowly coming in terms with patient's terminal illness and poor prognosis.  He wishes to speak to his children and let us know regarding CODE STATUS by tomorrow.  Both patient and husband aware that medical team will follow current orders for full code for any overnight adverse events unless husband communicates with bedside nurse/on-call team regarding any changes in their wishes for medical care.  Unfortunately, given patient's poor memory, her cognitive ability is questionable currently and we will have to rely on her  Husband's (next of kin/healthcare proxy) decision.  Metoprolol IV as needed ordered for heart rate greater than 120 with holding parameters for blood pressure.  Will hold off on IV antibiotics per ID recommendations and plan for LP in AM.

## 2019-06-26 NOTE — Consult Note (Signed)
[facility] for Infectious Disease       Reason for Consult: fever    Referring Physician: Dr. 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 ([place])   Acute metabolic encephalopathy   Hyponatremia   AKI (acute kidney injury) ([place])   Normocytic anemia   Cauda equina compression (HCC)   PE (pulmonary thromboembolism) (HCC)   Sacral decubitus ulcer, stage II (H[facility])    chlorhexidine  15 mL Mouth Rinse BID   Chlorhexidine Gluconate Cloth  6 each Topical Daily   famotidine  20 mg Oral Daily   feeding supplement  1 Container Oral Q24H   feeding supplement (ENSURE ENLIVE)  237 mL Oral Q24H   feeding supplement (PRO-STAT SUGAR FREE 64)  30 mL Oral Daily   gabapentin  100 mg Oral TID   mouth rinse  15 mL Mouth Rinse q12n4p   mirtazapine  7.5 mg Oral QHS   multivitamin with minerals  1 tablet Oral Daily   sodium chloride flush  10-40 mL Intracatheter Q12H   tiZANidine  4 mg Oral TID    Recommendations: Lumbar puncture for ? Encephalitis, meningitis Paracentesis for cell count and culture Continue to observe off of antibiotics Supportive care for fever   Assessment: She has a fever and now leukocytosis after empiric antibiotics for 6 days for FUO.  Differential includes infectious - SBP, meningoencephalitis, bacteremia, non-infectious tumor fever.  I discussed this with the patient and husband at the bedside and they both would like to proceed with interventions to determine cause of fever or elimination of causes and potential causes include above concerns.   Discussed with Dr. Earnest Conroy  Antibiotics: Cefepime, vancomycin x 6 days  HPI: Kaylee Randolph is a 53 y.o. female with metastatic breast cancer to spinal cord and abdomen with peritoneal carcinomatosis on chemotherapy at [facility] who presented on 10/2 with fever and altered mental status.  She had been on cipro for a UTI as an  outpatient but developed fever and chills and brought to ED by her husband.  She improved and now with normal mentation but fever persists.  WBC up to 13 today.  She has no localizing symptoms including no diarrhea, no sob, no cough, no headache, no neck pain, no dysuria.     Review of Systems:  Constitutional: positive for fevers and chills or negative for anorexia Respiratory: negative for cough, sputum or hemoptysis Genitourinary: negative for frequency and dysuria Integument/breast: negative for rash Neurological: negative for headaches All other systems reviewed and are negative    Past Medical History:  Diagnosis Date   Cancer ([place])    Hypertension     Social History   Tobacco Use   Smoking status: Never Smoker   Smokeless tobacco: Never Used  Substance Use Topics   Alcohol use: Not Currently   Drug use: Never    Family History  Problem Relation Age of Onset   Huntington's disease Mother    Huntington's disease Sister     No Known Allergies  Physical Exam: Constitutional: in no apparent distress  Vitals:   06/26/19 1200 06/26/19 1600  BP: 105/63 122/78  Pulse: 87 (!) 111  Resp:  20  Temp: 100.2 F (37.9 C) (!) 100.8 F (38.2 C)  SpO2: 100% 99%   EYES: anicteric ENMT: no thrush Cardiovascular: Cor RRR Respiratory: CTA B; normal respiratory effort GI: soft, somewhat taught, non-tender Musculoskeletal: no edema Skin: negatives:  no rash Hematologic: no cervical lad  Lab Results  Component Value Date   WBC 13.0 (H) 06/26/2019   HGB 8.4 (L) 06/26/2019   HCT 27.8 (L) 06/26/2019   MCV 90.8 06/26/2019   PLT 384 06/26/2019    Lab Results  Component Value Date   CREATININE 0.58 06/25/2019   BUN 19 06/25/2019   NA 141 06/25/2019   K 3.9 06/25/2019   CL 115 (H) 06/25/2019   CO2 18 (L) 06/25/2019    Lab Results  Component Value Date   ALT 12 06/25/2019   AST 12 (L) 06/25/2019   ALKPHOS 70 06/25/2019     Microbiology: Recent Results  (from the past 240 hour(s))  Culture, blood (Routine x 2)     Status: None   Collection Time: 06/20/19  8:00 PM   Specimen: BLOOD  Result Value Ref Range Status   Specimen Description   Final    BLOOD CHEST PORTA CATH Performed at [facility], [address] [place], [place] [address]    Special Requests   Final    BOTTLES DRAWN AEROBIC AND ANAEROBIC Blood Culture adequate volume Performed at [facility], [address] [place], [place] [address]    Culture   Final    NO GROWTH 5 DAYS Performed at [facility], [place] [address] [place], [place] [address]    Report Status 06/25/2019 FINAL  Final  Culture, blood (Routine x 2)     Status: None   Collection Time: 06/20/19  8:10 PM   Specimen: BLOOD  Result Value Ref Range Status   Specimen Description   Final    BLOOD RIGHT ANTECUBITAL Performed at [facility], [address] [place], [place] [address]    Special Requests   Final    BOTTLES DRAWN AEROBIC AND ANAEROBIC Blood Culture adequate volume Performed at [facility], [address] [place], [place] [address]    Culture   Final    NO GROWTH 5 DAYS Performed at [facility], [place] [address], [place], [place] [address]    Report Status 06/25/2019 FINAL  Final  SARS Coronavirus 2 [facility] order, Performed in [facility] hospital lab) Nasopharyngeal Nasopharyngeal Swab     Status: None   Collection Time: 06/20/19  8:19 PM   Specimen: Nasopharyngeal Swab  Result Value Ref Range Status   SARS Coronavirus 2 NEGATIVE NEGATIVE Final    Comment: (NOTE) If result is NEGATIVE SARS-CoV-2 target nucleic acids are NOT DETECTED. The SARS-CoV-2 RNA is generally detectable in upper and lower  respiratory specimens during the acute phase of infection. The lowest  concentration of SARS-CoV-2 viral copies this assay can detect is 250  copies / mL. A negative result does not preclude SARS-CoV-2 infection  and should not be used  as the sole basis for treatment or other  patient management decisions.  A negative result may occur with  improper specimen collection / handling, submission of specimen other  than nasopharyngeal swab, presence of viral mutation(s) within the  areas targeted by this assay, and inadequate number of viral copies  (<250 copies / mL). A negative result must be combined with clinical  observations, patient history, and epidemiological information. If result is POSITIVE SARS-CoV-2 target nucleic acids are DETECTED. The SARS-CoV-2 RNA is generally detectable in upper and lower  respiratory specimens dur ing the acute phase of infection.  Positive  results are indicative of active  infection with SARS-CoV-2.  Clinical  correlation with patient history and other diagnostic information is  necessary to determine patient infection status.  Positive results do  not rule out bacterial infection or co-infection with other viruses. If result is PRESUMPTIVE POSTIVE SARS-CoV-2 nucleic acids MAY BE PRESENT.   A presumptive positive result was obtained on the submitted specimen  and confirmed on repeat testing.  While 2019 novel coronavirus  (SARS-CoV-2) nucleic acids may be present in the submitted sample  additional confirmatory testing may be necessary for epidemiological  and / or clinical management purposes  to differentiate between  SARS-CoV-2 and other Sarbecovirus currently known to infect humans.  If clinically indicated additional testing with an alternate test  methodology [phone]) is advised. The SARS-CoV-2 RNA is generally  detectable in upper and lower respiratory sp ecimens during the acute  phase of infection. The expected result is Negative. Fact Sheet for Patients:  StrictlyIdeas.no Fact Sheet for Healthcare Providers: BankingDealers.co.za This test is not yet approved or cleared by the [place] FDA and has been authorized for  detection and/or diagnosis of SARS-CoV-2 by FDA under an Emergency Use Authorization (EUA).  This EUA will remain in effect (meaning this test can be used) for the duration of the COVID-19 declaration under Section 564(b)(1) of the Act, 21 U.S.C. section 360bbb-3(b)(1), unless the authorization is terminated or revoked sooner. Performed at [facility], [address] [place], [place] [address]   Urine culture     Status: None   Collection Time: 06/20/19  8:32 PM   Specimen: In/Out Cath Urine  Result Value Ref Range Status   Specimen Description   Final    IN/OUT CATH URINE Performed at [facility], [address] [place], [place] [address]    Special Requests   Final    NONE Performed at [facility], [address] [place], [place] [address]    Culture   Final    NO GROWTH Performed at [facility], [place] [address] [place], [place] [address]    Report Status 06/21/2019 FINAL  Final  MRSA PCR Screening     Status: None   Collection Time: 06/21/19  3:53 AM   Specimen: Nasal Mucosa; Nasopharyngeal  Result Value Ref Range Status   MRSA by PCR NEGATIVE NEGATIVE Final    Comment:        The GeneXpert MRSA Assay (FDA approved for NASAL specimens only), is one component of a comprehensive MRSA colonization surveillance program. It is not intended to diagnose MRSA infection nor to guide or monitor treatment for MRSA infections. Performed at [facility], [place] [address], [place], [place] [address]     Zaiden Ludlum W Natausha Jungwirth, MD [facility] [email or web address] 06/26/2019, 4:44 PM

## 2019-06-26 NOTE — Progress Notes (Addendum)
Initial Nutrition Assessment  RD working remotely.   DOCUMENTATION CODES:   Not applicable  INTERVENTION:  - will change Ensure Enlive from BID to once/day, each supplement provides 350 kcal and 20 grams of protein. - will order Boost Breeze once/day, each supplement provides 250 kcal and 9 grams of protein. - will order 30 mL prostat once/day, each supplement provides 100 kcal and 15 grams of protein. - will order daily multivitamin with minerals.   *based on SBO at time of admission and peritoneal carcinomatosis, patient may require TPN to fully meet nutrition needs and to consume PO items as able to supplement.  She has a port which could be used for TPN administration at home.    NUTRITION DIAGNOSIS:   Increased nutrient needs related to acute illness, chronic illness, cancer and cancer related treatments as evidenced by estimated needs.  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 She presented to the ED on 10/2 with a fever and AMS. She had recently been diagnosed with E. coli UTI which was treated with Cipro. Mental status began to improve once she arrived to the ED. She reported severe abdominal pain in the ED.  Significant Events: 10/2- admission 10/3- NGT placed 10/4- NGT replaced x2 10/5- NGT replaced 10/6- NGT removed at 0300   Per flow sheet, patient has mild edema to LLE and deep pitting edema to RLE. Heart Healthy diet ordered on 10/2 at 0440 and then changed to NPO that day at 1910. Diet was  then advanced to [facility] on 10/5 at 1132 and then to [place] on 10/6 at [place]. No intakes documented since admission. Ensure ordered BID yesterday and patient accepted 1 of the 2 bottles offered to her.   Per chart review, current weight is 140 lb which is consistent with weight recorded from [place] on 05/31/19. Weight at [facility] on 06/07/19 recorded as 155 lb. Will continue to monitor weight trends closely.   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 Medications reviewed; 20 mg oral pepcid/day. IVF; D5-1/2 NS-20 mEq KCl @ 100 ml/hr (408 kcal).    NUTRITION - FOCUSED PHYSICAL EXAM:  unable to complete at this time.   Diet Order:   Diet Order            Diet full liquid Room service appropriate? Yes; Fluid consistency: Thin  Diet effective now              EDUCATION NEEDS:   No education needs have been identified at this time  Skin:  Skin Assessment: Skin Integrity Issues: Skin Integrity Issues:: Stage II Stage II: coccyx  Last BM:  10/6  Height:   Ht Readings  from Last 1 Encounters:  06/20/19 5\' 4"  (1.626 m)    Weight:   Wt Readings from Last 1 Encounters:  06/20/19 63.5 kg    Ideal Body Weight:  54.5 kg  BMI:  Body mass index is 24.03 kg/m.  Estimated Nutritional Needs:   Kcal:  2100-2300 kcal  Protein:  105-115 grams  Fluid:  >/= 2 L/day      Jarome Matin, MS, RD, LDN, [facility] Inpatient Clinical Dietitian Pager # [phone] After hours/weekend pager # [phone]

## 2019-06-27 ENCOUNTER — Inpatient Hospital Stay (HOSPITAL_COMMUNITY): Payer: BC Managed Care – PPO

## 2019-06-27 LAB — CBC
HCT: 29.1 % — ABNORMAL LOW (ref 36.0–46.0)
Hemoglobin: 8.8 g/dL — ABNORMAL LOW (ref 12.0–15.0)
MCH: 27.1 pg (ref 26.0–34.0)
MCHC: 30.2 g/dL (ref 30.0–36.0)
MCV: 89.5 fL (ref 80.0–100.0)
Platelets: 418 10*3/uL — ABNORMAL HIGH (ref 150–400)
RBC: 3.25 MIL/uL — ABNORMAL LOW (ref 3.87–5.11)
RDW: 19.7 % — ABNORMAL HIGH (ref 11.5–15.5)
WBC: 10.6 10*3/uL — ABNORMAL HIGH (ref 4.0–10.5)
nRBC: 0.4 % — ABNORMAL HIGH (ref 0.0–0.2)

## 2019-06-27 LAB — APTT: aPTT: 90 seconds — ABNORMAL HIGH (ref 24–36)

## 2019-06-27 LAB — GRAM STAIN

## 2019-06-27 LAB — GLUCOSE, CAPILLARY
Glucose-Capillary: 120 mg/dL — ABNORMAL HIGH (ref 70–99)
Glucose-Capillary: 113 mg/dL — ABNORMAL HIGH (ref 70–99)
Glucose-Capillary: 94 mg/dL (ref 70–99)
Glucose-Capillary: 75 mg/dL (ref 70–99)
Glucose-Capillary: 92 mg/dL (ref 70–99)

## 2019-06-27 LAB — LACTATE DEHYDROGENASE, PLEURAL OR PERITONEAL FLUID: LD, Fluid: 281 U/L — ABNORMAL HIGH (ref 3–23)

## 2019-06-27 LAB — PROTIME-INR
INR: 1.2 (ref 0.8–1.2)
Prothrombin Time: 14.7 seconds (ref 11.4–15.2)

## 2019-06-27 LAB — CSF CELL COUNT WITH DIFFERENTIAL
Eosinophils, CSF: 1 % (ref 0–1)
Lymphs, CSF: 15 % — ABNORMAL LOW (ref 40–80)
Monocyte-Macrophage-Spinal Fluid: 78 % — ABNORMAL HIGH (ref 15–45)
RBC Count, CSF: 11 /mm3 — ABNORMAL HIGH
Segmented Neutrophils-CSF: 6 % (ref 0–6)
Tube #: 4
WBC, CSF: 4 /mm3 (ref 0–5)

## 2019-06-27 LAB — BODY FLUID CELL COUNT WITH DIFFERENTIAL
Lymphs, Fluid: 28 %
Monocyte-Macrophage-Serous Fluid: 22 % — ABNORMAL LOW (ref 50–90)
Neutrophil Count, Fluid: 50 % — ABNORMAL HIGH (ref 0–25)
Total Nucleated Cell Count, Fluid: 364 cu mm (ref 0–1000)

## 2019-06-27 LAB — ALBUMIN, PLEURAL OR PERITONEAL FLUID: Albumin, Fluid: 1.1 g/dL

## 2019-06-27 LAB — PROCALCITONIN: Procalcitonin: 2.16 ng/mL

## 2019-06-27 LAB — PROTEIN, PLEURAL OR PERITONEAL FLUID: Total protein, fluid: <3 g/dL

## 2019-06-27 LAB — PROTEIN, CSF: Total  Protein, CSF: 110 mg/dL — ABNORMAL HIGH (ref 15–45)

## 2019-06-27 LAB — GLUCOSE, PLEURAL OR PERITONEAL FLUID: Glucose, Fluid: 96 mg/dL

## 2019-06-27 LAB — HEPARIN LEVEL (UNFRACTIONATED): Heparin Unfractionated: 0.21 IU/mL — ABNORMAL LOW (ref 0.30–0.70)

## 2019-06-27 LAB — GLUCOSE, CSF: Glucose, CSF: 35 mg/dL — ABNORMAL LOW (ref 40–70)

## 2019-06-27 MED ORDER — LIDOCAINE HCL 1 % IJ SOLN
INTRAMUSCULAR | Status: AC
  Filled 2019-06-27: qty 20

## 2019-06-27 MED ORDER — LIDOCAINE HCL 1 % IJ SOLN
INTRAMUSCULAR | Status: AC
  Filled 2019-06-27: qty 10

## 2019-06-27 MED ORDER — HEPARIN (PORCINE) 25000 UT/250ML-% IV SOLN
1250.0000 [IU]/h | INTRAVENOUS | Status: DC
  Administered 2019-06-28 (×2): 900 [IU]/h via INTRAVENOUS
  Filled 2019-06-27: qty 250

## 2019-06-27 MED ORDER — ALBUMIN HUMAN 25 % IV SOLN
12.5000 g | Freq: Once | INTRAVENOUS | Status: AC
  Administered 2019-06-27: 12.5 g via INTRAVENOUS
  Filled 2019-06-27: qty 50

## 2019-06-27 NOTE — Progress Notes (Signed)
Palliative Care Brief Progress Note  I met today with patient and her husband.  Plan at this point remains for continued aggressive interventions,  She remains a FULL CODE.  Family to discuss further this evening.  Full consult note to follow.  Micheline Rough, MD [place] Team [phone]

## 2019-06-27 NOTE — Progress Notes (Signed)
ANTICOAGULATION CONSULT NOTE   Pharmacy Consult for heparin  Indication: hx pulmonary embolus (holding xarelto)  No Known Allergies  Patient Measurements: Height: 5\' 4"  (162.6 cm) Weight: 140 lb (63.5 kg) IBW/kg (Calculated) : 54.7 Heparin Dosing Weight: 63 kg  Vital Signs: Temp: 100.4 F (38 C) (10/08 0608) Temp Source: Bladder (10/08 0600) BP: 117/77 (10/08 0400) Pulse Rate: 116 (10/08 0608)  Labs: Recent Labs    06/25/19 0154 06/25/19 0553 06/25/19 1319 06/25/19 2042 06/26/19 0504 06/27/19 0157  HGB 8.2*  --   --   --  8.4* 8.8*  HCT 26.4*  --   --   --  27.8* 29.1*  PLT 376  --   --   --  384 418*  APTT  --  150* 183* 131*  --   --   HEPARINUNFRC 0.86*  --  0.52 0.60 0.63 0.21*  CREATININE 0.58  --   --   --   --   --     Estimated Creatinine Clearance: 70.2 mL/min (by C-G formula based on SCr of 0.58 mg/dL).   Medications:  Xarelto 20 mg daily PTA  Assessment: Patient is a 53 y.o F with hx metastatic breast and peritoneal cancer on chemotherapy and PE on xarelto PTA, presented to [facility] ED on 10/1 with c/o fever. Abd CT on 10/2 showed findings consistent with distal SBO. CCS recommends NG tube for decompression and bowel rest.  Pharmacy was consulted to transition patient from [place] to heparin drip on 10/3.  Today, 06/27/2019:  Heparin level subtherapeutic on 800 units/hr  CBC: Hgb 8.8-low but stable,  Plt stable WNL  No bleeding noted  No infusion related issues reported by nursing  Goal of Therapy:  Heparin level 0.3-0.7 units/ml  Monitor platelets by anticoagulation protocol: Yes   Plan:   Increase heparin infusion to 900 units/hr  F/U timing for paracentesis, needle bx & hold heparin accordingly  Recheck HL in 6 hours  Daily heparin level and CBC while on heparin  Monitor for signs of bleeding or thrombosis  F/u plans to resume Xarelto once no further interventions planned  Netta Cedars, PharmD, BCPS 06/27/2019, 7:35 AM

## 2019-06-27 NOTE — Progress Notes (Signed)
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 Patient recently was diagnosed with E. coli UTI which was pansensitive and was treated with ciprofloxacin. Patient  Also reported abdominal pain. Hospital course complicated by persistent fevers with no clear source, AKI, anemia requiring blood transfusion and SBO in the setting of peritoneal carcinomatosis prompting GS consult-managed medically. Initially started on vancomycin and cefepime for presumed sepsis.  Unclear source of infection.Patient completed 5 days of antibiotic therapy.  Blood cultures / Urine culture showed no growth. Now felt to be drug induced fever, being monitored off antibiotics. Mental status has improved.   Subjective: Patient underwent paracentesis this morning with drainage of 2 L of ascitic fluid.  Tolerated procedure well.  Seen in rounds this afternoon.  Appears much more awake alert.  Husband bedside and patient was awaiting LP.  T-max of 100.5 overnight  Objective: Vitals:   06/27/19 0200 06/27/19 0400 06/27/19 0600 06/27/19 0608  BP: 109/68 117/77    Pulse: (!) 110 (!) 106  (!) 116  Resp: 18 (!) 25  20  Temp: 100 F (37.8 C) 99.7 F (37.6 C)  (!) 100.4 F (38 C)  TempSrc:  Bladder Bladder   SpO2: 97% 100%  99%  Weight:      Height:        Intake/Output Summary (Last 24 hours) at 06/27/2019 1726 Last data filed at 06/27/2019 0456 Gross per 24 hour  Intake 1080 ml  Output 700 ml  Net 380 ml   Filed Weights   06/20/19 2014  Weight: 63.5 kg     Physical Examination:  General exam: Appears weak but no respiratory distress and comfortable  Respiratory system: Clear to auscultation. Respiratory effort normal. Cardiovascular system: S1 & S2 heard. No JVD, murmurs.  1+ pedal edema. Gastrointestinal system: Abdomen is nondistended, soft and nontender. No organomegaly or masses felt. Normal bowel sounds heard. Central nervous system: Alert and oriented. No focal neurological deficits.  Generalized weakness with decreased strength 4/5 in upper extremities, 2/5 in lower extremities right > left Skin: No rashes, lesions or ulcers Psychiatry: Oriented x3, judgement and insight appear normal. Mood & affect flat.    Data Reviewed: I have personally reviewed following labs and imaging studies  CBC: Recent Labs  Lab 06/20/19 2000  06/23/19 0508 06/24/19 0431 06/25/19 0154 06/26/19 0504 06/27/19 0157  WBC 7.7   < > 7.7 7.4 9.2 13.0* 10.6*  NEUTROABS 6.0  --   --   --   --   --   --   HGB 8.1*   < > 6.8* 9.5* 8.2* 8.4* 8.8*  HCT 25.6*   < > 21.8* 30.0* 26.4* 27.8* 29.1*  MCV 86.8   < > 87.9 87.0 87.4 90.8 89.5  PLT 456*   < > 351 415* 376 384 418*   < > = values in this interval not displayed.   Basic Metabolic Panel: Recent Labs  Lab 06/21/19 0523 06/22/19 0525 06/23/19 0508 06/24/19 0431 06/24/19 0954 06/25/19 0154  NA 135 136 139 141  --  141  K 3.2* 3.2* 3.1* 2.8*  --  3.9  CL 103 108 114* 115*  --  115*  CO2 22 20* 21* 18*  --  18*  GLUCOSE 86 113* 135* 123*  --  132*  BUN 17 19 18 17   --  19  CREATININE 1.01* 0.78 0.59 0.55  --  0.58  CALCIUM 8.2* 8.0* 8.0* 8.2*  --  7.7*  MG  --   --  1.8  --  1.6*  --   PHOS  --   --  2.0*  --   --   --    GFR: Estimated Creatinine Clearance: 70.2 mL/min (by C-G formula based on SCr of 0.58 mg/dL). Liver Function Tests: Recent Labs  Lab 06/20/19 2000 06/22/19 0525 06/24/19 0431 06/25/19 0154  AST 20 15 14* 12*  ALT 24 18 18 12   ALKPHOS 89 82 84 70  BILITOT 0.6 0.3  0.4 0.7  PROT 5.7* 5.2* 5.2* 4.7*  ALBUMIN 2.6* 2.1* 2.0* 1.7*   No results for input(s): LIPASE, AMYLASE in the last 168 hours. No results for input(s): AMMONIA in the last 168 hours. Coagulation Profile: Recent Labs  Lab 06/20/19 2000 06/27/19 0856  INR 2.7* 1.2   Cardiac Enzymes: No results for input(s): CKTOTAL, CKMB, CKMBINDEX, TROPONINI in the last 168 hours. BNP (last 3 results) No results for input(s): PROBNP in the last 8760 hours. HbA1C: No results for input(s): HGBA1C in the last 72 hours. CBG: Recent Labs  Lab 06/26/19 2346 06/27/19 0335 06/27/19 0838 06/27/19 1225 06/27/19 1627  GLUCAP 112* 120* 113* 94 75   Lipid Profile: No results for input(s): CHOL, HDL, LDLCALC, TRIG, CHOLHDL, LDLDIRECT in the last 72 hours. Thyroid Function Tests: No results for input(s): TSH, T4TOTAL, FREET4, T3FREE, THYROIDAB in the last 72 hours. Anemia Panel: No results for input(s): VITAMINB12, FOLATE, FERRITIN, TIBC, IRON, RETICCTPCT in the last 72 hours. Sepsis Labs: Recent Labs  Lab 06/20/19 2000 06/20/19 2142 06/21/19 0523 06/21/19 0528 06/25/19 1319 06/26/19 0504 06/27/19 0157  PROCALCITON  --   --  3.20  --  1.16 <0.10 2.16  LATICACIDVEN 2.3* 2.4*  --  1.1  --   --   --     Recent Results (from the past 240 hour(s))  Culture, blood (Routine x 2)     Status: None   Collection Time: 06/20/19  8:00 PM   Specimen: BLOOD  Result Value Ref Range Status   Specimen Description   Final    BLOOD CHEST PORTA CATH Performed at [facility], [address] [place], [place] [address]    Special Requests   Final    BOTTLES DRAWN AEROBIC AND ANAEROBIC Blood Culture adequate volume Performed at [facility], [address] [place], [place] [address]    Culture   Final    NO GROWTH 5 DAYS Performed at [facility], [place] [address], [place], [place] [address]    Report Status 06/25/2019 FINAL  Final  Culture, blood (Routine x 2)     Status:  None   Collection Time: 06/20/19  8:10 PM   Specimen: BLOOD  Result Value Ref Range Status   Specimen Description   Final    BLOOD RIGHT ANTECUBITAL Performed at [facility], [address] [place], [place] [address]    Special Requests   Final    BOTTLES DRAWN AEROBIC AND ANAEROBIC Blood Culture adequate volume Performed at [facility], [address], [place], [place] [address]  Culture   Final    NO GROWTH 5 DAYS Performed at [facility], [place] [address] [place], [place] [address]    Report Status 06/25/2019 FINAL  Final  SARS Coronavirus 2 [facility] order, Performed in [facility] hospital lab) Nasopharyngeal Nasopharyngeal Swab     Status: None   Collection Time: 06/20/19  8:19 PM   Specimen: Nasopharyngeal Swab  Result Value Ref Range Status   SARS Coronavirus 2 NEGATIVE NEGATIVE Final    Comment: (NOTE) If result is NEGATIVE SARS-CoV-2 target nucleic acids are NOT DETECTED. The SARS-CoV-2 RNA is generally detectable in upper and lower  respiratory specimens during the acute phase of infection. The lowest  concentration of SARS-CoV-2 viral copies this assay can detect is 250  copies / mL. A negative result does not preclude SARS-CoV-2 infection  and should not be used as the sole basis for treatment or other  patient management decisions.  A negative result may occur with  improper specimen collection / handling, submission of specimen other  than nasopharyngeal swab, presence of viral mutation(s) within the  areas targeted by this assay, and inadequate number of viral copies  (<250 copies / mL). A negative result must be combined with clinical  observations, patient history, and epidemiological information. If result is POSITIVE SARS-CoV-2 target nucleic acids are DETECTED. The SARS-CoV-2 RNA is generally detectable in upper and lower  respiratory specimens dur ing the acute phase of infection.  Positive  results are indicative of  active infection with SARS-CoV-2.  Clinical  correlation with patient history and other diagnostic information is  necessary to determine patient infection status.  Positive results do  not rule out bacterial infection or co-infection with other viruses. If result is PRESUMPTIVE POSTIVE SARS-CoV-2 nucleic acids MAY BE PRESENT.   A presumptive positive result was obtained on the submitted specimen  and confirmed on repeat testing.  While 2019 novel coronavirus  (SARS-CoV-2) nucleic acids may be present in the submitted sample  additional confirmatory testing may be necessary for epidemiological  and / or clinical management purposes  to differentiate between  SARS-CoV-2 and other Sarbecovirus currently known to infect humans.  If clinically indicated additional testing with an alternate test  methodology [phone]) is advised. The SARS-CoV-2 RNA is generally  detectable in upper and lower respiratory sp ecimens during the acute  phase of infection. The expected result is Negative. Fact Sheet for Patients:  StrictlyIdeas.no Fact Sheet for Healthcare Providers: BankingDealers.co.za This test is not yet approved or cleared by the [place] FDA and has been authorized for detection and/or diagnosis of SARS-CoV-2 by FDA under an Emergency Use Authorization (EUA).  This EUA will remain in effect (meaning this test can be used) for the duration of the COVID-19 declaration under Section 564(b)(1) of the Act, 21 U.S.C. section 360bbb-3(b)(1), unless the authorization is terminated or revoked sooner. Performed at [facility], [address] [place], [place] [address]   Urine culture     Status: None   Collection Time: 06/20/19  8:32 PM   Specimen: In/Out Cath Urine  Result Value Ref Range Status   Specimen Description   Final    IN/OUT CATH URINE Performed at [facility], [address] [place], [place] [address]     Special Requests   Final    NONE Performed at [facility], [address] [place], [place] [address]    Culture   Final  NO GROWTH Performed at [facility], [place] [address], [place], [place] [address]    Report Status 06/21/2019 FINAL  Final  MRSA PCR Screening     Status: None   Collection Time: 06/21/19  3:53 AM   Specimen: Nasal Mucosa; Nasopharyngeal  Result Value Ref Range Status   MRSA by PCR NEGATIVE NEGATIVE Final    Comment:        The GeneXpert MRSA Assay (FDA approved for NASAL specimens only), is one component of a comprehensive MRSA colonization surveillance program. It is not intended to diagnose MRSA infection nor to guide or monitor treatment for MRSA infections. Performed at [facility], [place] [address] [place], [place] [address]   Culture, blood (Routine X 2) w Reflex to ID Panel     Status: None (Preliminary result)   Collection Time: 06/26/19  1:40 PM   Specimen: BLOOD RIGHT HAND  Result Value Ref Range Status   Specimen Description   Final    BLOOD RIGHT HAND Performed at [facility] [address], [place], [place] [address]    Special Requests   Final    BOTTLES DRAWN AEROBIC ONLY Blood Culture results may not be optimal due to an inadequate volume of blood received in culture bottles Performed at [facility] [address], [place], [place] [address]    Culture   Final    NO GROWTH < 24 HOURS Performed at [facility] [address], [place], [place] [address]    Report Status PENDING  Incomplete  Culture, blood (Routine X 2) w Reflex to ID Panel     Status: None (Preliminary result)   Collection Time: 06/26/19  1:40 PM   Specimen: BLOOD  Result Value Ref Range Status   Specimen Description   Final    BLOOD LEFT ANTECUBITAL Performed at [facility] [address] [place], [place] [address]    Special Requests   Final    BOTTLES  DRAWN AEROBIC ONLY Blood Culture results may not be optimal due to an inadequate volume of blood received in culture bottles Performed at [facility] [address], [place], [place] [address]    Culture   Final    NO GROWTH < 24 HOURS Performed at [facility] [address], [place], [place] [address]    Report Status PENDING  Incomplete  Gram stain     Status: None   Collection Time: 06/27/19 11:45 AM   Specimen: Abdomen  Result Value Ref Range Status   Specimen Description ABDOMEN  Final   Special Requests NONE  Final   Gram Stain   Final    RARE WBC PRESENT,BOTH PMN AND MONONUCLEAR NO ORGANISMS SEEN Performed at [facility], [address], [place], [place] [address]    Report Status 06/27/2019 FINAL  Final      Radiology Studies: Ct Head Wo Contrast  Result Date: 06/27/2019 CLINICAL DATA:  53 year old female with metastatic breast cancer. Known spinal cord involvement. Encephalopathy. EXAM: CT HEAD WITHOUT CONTRAST TECHNIQUE: Contiguous axial images were obtained from the base of the skull through the vertex without intravenous contrast. COMPARISON:  None. FINDINGS: Brain: Partially empty sella. No midline shift, ventriculomegaly, mass effect, evidence of mass lesion, intracranial hemorrhage or evidence of cortically based acute infarction. Gray-white matter differentiation is within normal limits throughout the brain. No contrast administered. Vascular: No suspicious intracranial vascular hyperdensity. Skull: Hyperostosis of the  calvarium with no destructive or suspicious skull lesion identified. Sinuses/Orbits: Partially visible left maxillary sinus opacification but the other visualized paranasal sinuses and mastoids are clear. Other: Visualized orbits and scalp soft tissues are within normal limits. IMPRESSION: 1. Negative noncontrast CT appearance of the brain. Brain MRI without and with contrast would be most sensitive for early metastatic  disease. 2. No skull metastasis identified. Partially visible opacification of the left maxillary sinus. Electronically Signed   By: Genevie Ann M.D.   On: 06/27/2019 12:23   US Paracentesis  Result Date: 06/27/2019 INDICATION: Patient with history of metastatic breast cancer, small-bowel obstruction, fever, encephalopathy, PE, cauda equina syndrome, ascites. Request made for diagnostic and therapeutic paracentesis. EXAM: ULTRASOUND GUIDED DIAGNOSTIC AND THERAPEUTIC PARACENTESIS MEDICATIONS: None COMPLICATIONS: None immediate. PROCEDURE: Informed written consent was obtained from the patient's husband after a discussion of the risks, benefits and alternatives to treatment. A timeout was performed prior to the initiation of the procedure. Initial ultrasound scanning demonstrates a small to moderate amount of ascites within the left mid to lower abdominal quadrant. The left mid to lower abdomen was prepped and draped in the usual sterile fashion. 1% lidocaine was used for local anesthesia. Following this, a 19 gauge, 7-cm, Yueh catheter was introduced. An ultrasound image was saved for documentation purposes. The paracentesis was performed. The catheter was removed and a dressing was applied. The patient tolerated the procedure well without immediate post procedural complication. FINDINGS: A total of approximately 2.2 liters of yellow fluid was removed. Samples were sent to the laboratory as requested by the clinical team. IMPRESSION: Successful ultrasound-guided diagnostic and therapeutic paracentesis yielding 2.2 liters of peritoneal fluid. Read by: Rowe Robert, PA-C Electronically Signed   By: Jacqulynn Cadet M.D.   On: 06/27/2019 12:46        Scheduled Meds:  chlorhexidine  15 mL Mouth Rinse BID   Chlorhexidine Gluconate Cloth  6 each Topical Daily   famotidine  20 mg Oral Daily   feeding supplement  1 Container Oral Q24H   feeding supplement (ENSURE ENLIVE)  237 mL Oral Q24H   feeding  supplement (PRO-STAT SUGAR FREE 64)  30 mL Oral Daily   gabapentin  100 mg Oral TID   lidocaine       mouth rinse  15 mL Mouth Rinse q12n4p   mirtazapine  7.5 mg Oral QHS   multivitamin with minerals  1 tablet Oral Daily   sodium chloride flush  10-40 mL Intracatheter Q12H   tiZANidine  4 mg Oral TID   Continuous Infusions:  dextrose 5 % and 0.45 % NaCl with KCl 20 mEq/L 100 mL/hr at 06/27/19 0501   heparin Stopped (06/27/19 0924)    Assessment & Plan:    1.  Small bowel obstruction: CT abdomen on admission revealed SBO with metastatic disease/peritoneal carcinomatosis.  Seen by general surgery and managed conservatively.  She did have bowel movements during the hospital course, tolerating liquid diet and general surgery has signed off with recommendations to advance diet as tolerated.  Patient now on soft diet although reports poor appetite.  No abdominal distention or tenderness noted on exam.  2.  Fever of unclear etiology: Procalcitonin at 3.2 on presentation-> 1.16--> now <0.10.  Urine cultures and blood cultures so far negative.  Patient started on vancomycin and cefepime empirically x5 days which are now discontinued in concern for possible drug-induced fever.   Procalcitonin levels have come down, no white count.  Seen by ID and underwent diagnostic/therapeutic paracentesis as  well as lumbar puncture today for body fluid/CSF cultures.  Continue to monitor off antibiotics per ID  3.  Metabolic encephalopathy: Present on admission in the setting of problem #1 and 2.  CT head negative for any acute findings.  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  CSF sent for cytology today.  Patient however mentating well now.  Will continue to monitor.  4.  Breast cancer with diffuse metastasis-peritoneal carcinomatosis/spinal cord/adrenal mets: Patient was following [facility] onc[facility] and was undergoing chemotherapy-last session a week prior to  admission.  Patient over the last couple of days expressed wishes to avoid aggressive interventions.  She is still not in favor for feeding tubes/mechanical ventilation.  She discussed with her husband and children overnight.  CODE STATUS now changed to DNI.  Husband states they would like to consider palliative chemo once she is treated for any acute infections to prolong life at least for couple of weeks where she could spend time at home with family.  Palliative care following and also assisting in discussions regarding care goals.  5.  History of pulmonary embolism: On Xarelto at home--> currently on heparin given SBO/n.p.o. status on admission.  Will transition back to Xarelto if no procedures planned in am.  6.  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  Patient has bilateral leg weakness right more than left.  Foley catheter has been placed.  7.  Acute on chronic anemia: S/p blood transfusion.  Hemoglobin improved to 8.2  8.  Sacral decubitus stage II: Local wound care  9.  FEN-severe PCM: Patient continues to have poor appetite and barely ate 20% of her meals.  She feels she might do better with soft diet.  She states she does not want feeding tubes.  Her albumin level is less than 2 and likely contributing to her lower extremity edema/moderate ascitis.  DVT prophylaxis: On heparin IV Code Status: DNI per patient's wish Family / Patient Communication: Discussed with patient earlier today.  Will also discuss with husband Disposition Plan: Home with home health services per case management note. Unfortunately Ms. Pendley has very poor prognosis and is getting tired of multiple interventions.  Palliative care following along.   LOS: 7 days    Time spent: [place]    Guilford Shi, MD [facility] Pager [phone]  If 7PM-7AM, please contact night-coverage [email or web address] Password [facility] 06/27/2019, 5:26 PM

## 2019-06-27 NOTE — Progress Notes (Signed)
Daily Progress Note   Patient Name: Kaylee Randolph       Date: 06/27/2019 DOB: January 10, 1966  Age: 53 y.o. MRN#: [id number] Attending Physician: Guilford Shi, MD Primary Care Physician: Maury Dus, MD Admit Date: 06/20/2019  Reason for Consultation/Follow-up: Establishing goals of care  Subjective: I met today with patient and her husband.  She report being tired after procedures today.  Her husband reports talking with Dr. Earnest Conroy today and decision to change code status to DNI, but plan for CPR in the event of cardiac arrest.    He reports being hopeful for treatable cause to be found for fevers.  Also expressed that he wants "just a little more time" and is hoping she can stabilize enough to have more chemotherapy.  We discussed that if she has reached a point where oncology feels that further chemotherapy is unlikely to benefit her, that this does not mean that there is not care left to give, but rather care should be focused on aggressive symptom management to preserve as much functional status as possible.  Length of Stay: 7  Current Medications: Scheduled Meds:  . chlorhexidine  15 mL Mouth Rinse BID  . Chlorhexidine Gluconate Cloth  6 each Topical Daily  . famotidine  20 mg Oral Daily  . feeding supplement  1 Container Oral Q24H  . feeding supplement (ENSURE ENLIVE)  237 mL Oral Q24H  . feeding supplement (PRO-STAT SUGAR FREE 64)  30 mL Oral Daily  . gabapentin  100 mg Oral TID  . mouth rinse  15 mL Mouth Rinse q12n4p  . mirtazapine  7.5 mg Oral QHS  . multivitamin with minerals  1 tablet Oral Daily  . sodium chloride flush  10-40 mL Intracatheter Q12H  . tiZANidine  4 mg Oral TID    Continuous Infusions: . dextrose 5 % and 0.45 % NaCl with KCl 20 mEq/L 100 mL/hr  at 06/27/19 0501  . heparin 900 Units/hr (06/27/19 1745)    PRN Meds: acetaminophen **OR** acetaminophen, HYDROmorphone (DILAUDID) injection, hyoscyamine, LORazepam, metoprolol tartrate, ondansetron **OR** ondansetron (ZOFRAN) IV, phenol, promethazine, sodium chloride flush  Physical Exam  General: Alert, awake, in no acute distress.  HEENT: No bruits, no goiter, no JVD Heart: Regular rate and rhythm. No murmur appreciated. Lungs: Good air movement, clear Abdomen: Soft, nontender, nondistended, positive bowel  sounds.  Ext: No significant edema Skin: Warm and dry Neuro: LE weakness         Vital Signs: BP 128/73   Pulse (!) 118   Temp (!) 100.8 F (38.2 C)   Resp 19   Ht 5' 4"  (1.626 m)   Wt 63.5 kg   SpO2 100%   BMI 24.03 kg/m  SpO2: SpO2: 100 % O2 Device: O2 Device: Room Air O2 Flow Rate:    Intake/output summary:   Intake/Output Summary (Last 24 hours) at 06/27/2019 2252 Last data filed at 06/27/2019 0456 Gross per 24 hour  Intake 1080 ml  Output 700 ml  Net 380 ml   LBM: Last BM Date: 06/27/19 Baseline Weight: Weight: 63.5 kg Most recent weight: Weight: 63.5 kg       Palliative Assessment/Data:    Flowsheet Rows     Most Recent Value  Intake Tab  Referral Department  Hospitalist  Unit at Time of Referral  ICU  Palliative Care Primary Diagnosis  Cancer  Date Notified  06/26/19  Palliative Care Type  New Palliative care  Reason for referral  Clarify Goals of Care  Date of Admission  06/20/19  Date first seen by Palliative Care  06/26/19  # of days Palliative referral response time  0 Day(s)  # of days IP prior to Palliative referral  6  Clinical Assessment  Palliative Performance Scale Score  40%  Psychosocial & Spiritual Assessment  Palliative Care Outcomes  Patient/Family meeting held?  Yes  Who was at the meeting?  Patient, family      Patient Active Problem List   Diagnosis Date Noted  . Cauda equina compression ([place]) 06/21/2019  . PE  (pulmonary thromboembolism) ([place]) 06/21/2019  . Sacral decubitus ulcer, stage II ([place]) 06/21/2019  . Hypertension   . 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 Acute metabolic encephalopathy   . Hyponatremia   . AKI (acute kidney injury) ([place])   . Sepsis ([place])   . Normocytic anemia     Palliative Care Assessment & Plan   Patient Profile: 53 y.o. 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  She has unclear source of fever and has completed 5 days antibiotic therapy.  Blood and urine cultures did not show growth.  ID consulted today.  Palliative consulted for goals of care.  Of note, she does follow with palliative care at [facility] where she receives her chemotherapy.  Last note reviewed there reveals visit focused on symptom management rather than advance care planning.  Assessment: Patient Active Problem List   Diagnosis Date Noted  . Cauda equina compression ([place]) 06/21/2019  . PE (pulmonary thromboembolism) ([place]) 06/21/2019  . Sacral decubitus ulcer, stage II ([place]) 06/21/2019  . Hypertension   . 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 Acute metabolic encephalopathy   . Hyponatremia   . AKI (acute kidney injury) ([place])   . Sepsis ([place])   . Normocytic anemia    Recommendations/Plan:  Await results of LP and paracentesis.  Code status changed to DNI.  Husband remains hopeful for some stabilization and that she will be able to follow up with her oncologist for consideration for additional systemic therapy.  I gave his a copy of [facility]  [facility] [employer] to review.  Will continue to follow and progress conversation based on clinical course and as family is emotionally able.  Goals of  Care and Additional Recommendations:  Limitations on Scope of Treatment: Full Scope Treatment  Code Status:    Code Status Orders  (From admission, onward)         Start     Ordered   06/27/19 1448  Limited resuscitation (code)  Continuous    Question Answer Comment  In the event of cardiac or respiratory ARREST: Initiate Code Blue, Call Rapid Response Yes   In the event of cardiac or respiratory ARREST: Perform CPR Yes   In the event of cardiac or respiratory ARREST: Perform Intubation/Mechanical Ventilation No   In the event of cardiac or respiratory ARREST: Use NIPPV/BiPAp only if indicated Yes   In the event of cardiac or respiratory ARREST: Administer ACLS medications if indicated Yes   In the event of cardiac or respiratory ARREST: Perform Defibrillation or Cardioversion if indicated Yes      06/27/19 1447        Code Status History    Date Active Date Inactive Code Status Order ID Comments User Context   06/21/2019 0441 06/27/2019 1447 Full Code [id number]  Ivor Costa, MD Inpatient   Advance Care Planning Activity    Advance Directive Documentation     Most Recent Value  Type of Advance Directive  Living will  Pre-existing out of facility DNR order (yellow form or pink MOST form)  -  "MOST" Form in Place?  -       Prognosis:   Unable to determine  Discharge Planning:  To Be Determined  Care plan was discussed with RN, patient, husband  Thank you for allowing the Palliative Medicine Team to assist in the care of this patient.   Time In: 1810 Time Out: 1850 Total Time 40 Prolonged Time Billed No      Greater than 50%  of this time was spent counseling and coordinating care related to the above assessment and plan.  Micheline Rough, MD  Please contact Palliative Medicine Team phone at [phone] for questions and concerns.

## 2019-06-27 NOTE — Progress Notes (Signed)
Pharmacy: heparin brief note  Heparin was off today for paracentesis and LP.  Per Dr Jacalyn Lefevre OK to resume heparin 1 hr s/p LP.  RN to resume heparin at 1730  Plan: resume heparin at 900 units/hr at 1730 Check 6 hr heparin level at 2330 F/u for s/sx bleeding  Eudelia Bunch, Pharm.D [phone] 06/27/2019 5:22 PM

## 2019-06-27 NOTE — Procedures (Signed)
L2-3 LP performed without difficulty.  10cc removed and sent for requested testing.

## 2019-06-27 NOTE — Procedures (Signed)
Ultrasound-guided diagnostic and therapeutic paracentesis performed yielding 2.2 liters of yellow  fluid. No immediate complications. EBL none. The fluid was sent to the lab for preordered studies.

## 2019-06-28 ENCOUNTER — Other Ambulatory Visit: Payer: Self-pay | Admitting: Hematology and Oncology

## 2019-06-28 DIAGNOSIS — C801 Malignant (primary) neoplasm, unspecified: Secondary | ICD-10-CM

## 2019-06-28 DIAGNOSIS — R188 Other ascites: Secondary | ICD-10-CM

## 2019-06-28 DIAGNOSIS — R4182 Altered mental status, unspecified: Secondary | ICD-10-CM

## 2019-06-28 DIAGNOSIS — C50919 Malignant neoplasm of unspecified site of unspecified female breast: Secondary | ICD-10-CM

## 2019-06-28 DIAGNOSIS — C786 Secondary malignant neoplasm of retroperitoneum and peritoneum: Secondary | ICD-10-CM

## 2019-06-28 LAB — BASIC METABOLIC PANEL
Anion gap: 7 (ref 5–15)
BUN: 14 mg/dL (ref 6–20)
CO2: 17 mmol/L — ABNORMAL LOW (ref 22–32)
Calcium: 8.5 mg/dL — ABNORMAL LOW (ref 8.9–10.3)
Chloride: 113 mmol/L — ABNORMAL HIGH (ref 98–111)
Creatinine, Ser: 0.62 mg/dL (ref 0.44–1.00)
GFR calc Af Amer: >60 mL/min (ref >60)
GFR calc non Af Amer: >60 mL/min (ref >60)
Glucose, Bld: 93 mg/dL (ref 70–99)
Potassium: 4.5 mmol/L (ref 3.5–5.1)
Sodium: 137 mmol/L (ref 135–145)

## 2019-06-28 LAB — GLUCOSE, CAPILLARY
Glucose-Capillary: 101 mg/dL — ABNORMAL HIGH (ref 70–99)
Glucose-Capillary: 98 mg/dL (ref 70–99)
Glucose-Capillary: 80 mg/dL (ref 70–99)
Glucose-Capillary: 92 mg/dL (ref 70–99)
Glucose-Capillary: 95 mg/dL (ref 70–99)

## 2019-06-28 LAB — BLOOD CULTURE ID PANEL (REFLEXED)

## 2019-06-28 LAB — ACID FAST SMEAR (AFB, MYCOBACTERIA): Acid Fast Smear: NEGATIVE

## 2019-06-28 LAB — MAGNESIUM: Magnesium: 1.7 mg/dL (ref 1.7–2.4)

## 2019-06-28 LAB — HEPARIN LEVEL (UNFRACTIONATED)
Heparin Unfractionated: <0.1 IU/mL — ABNORMAL LOW (ref 0.30–0.70)
Heparin Unfractionated: 0.98 IU/mL — ABNORMAL HIGH (ref 0.30–0.70)

## 2019-06-28 MED ORDER — HEPARIN BOLUS VIA INFUSION
2000.0000 [IU] | Freq: Once | INTRAVENOUS | Status: AC
  Administered 2019-06-28: 2000 [IU] via INTRAVENOUS
  Filled 2019-06-28: qty 2000

## 2019-06-28 MED ORDER — SODIUM CHLORIDE 0.9 % IV SOLN
Freq: Once | INTRAVENOUS | Status: AC
  Administered 2019-06-28: 16:00:00 via INTRAVENOUS
  Filled 2019-06-28: qty 5

## 2019-06-28 MED ORDER — RIVAROXABAN 20 MG PO TABS
20.0000 mg | ORAL_TABLET | Freq: Every day | ORAL | Status: DC
  Administered 2019-06-28 – 2019-07-05 (×8): 20 mg via ORAL
  Filled 2019-06-28 (×8): qty 1

## 2019-06-28 MED ORDER — PALONOSETRON HCL INJECTION 0.25 MG/5ML
0.2500 mg | Freq: Once | INTRAVENOUS | Status: AC
  Administered 2019-06-28: 0.25 mg via INTRAVENOUS
  Filled 2019-06-28: qty 5

## 2019-06-28 MED ORDER — SODIUM CHLORIDE 0.9 % IV SOLN
532.5000 mg | Freq: Once | INTRAVENOUS | Status: AC
  Administered 2019-06-28: 530 mg via INTRAVENOUS
  Filled 2019-06-28: qty 53

## 2019-06-28 MED ORDER — QUETIAPINE FUMARATE 50 MG PO TABS
25.0000 mg | ORAL_TABLET | Freq: Every day | ORAL | Status: DC
  Administered 2019-06-28 – 2019-07-05 (×7): 25 mg via ORAL
  Filled 2019-06-28 (×8): qty 1

## 2019-06-28 MED ORDER — TIZANIDINE HCL 4 MG PO TABS
4.0000 mg | ORAL_TABLET | Freq: Three times a day (TID) | ORAL | Status: DC | PRN
  Administered 2019-07-02: 4 mg via ORAL
  Filled 2019-06-28: qty 1

## 2019-06-28 NOTE — Progress Notes (Signed)
Pt. Is AMS, with extreme paranoia and bizarre behavior. She broke Heparin IV pump tubing in half and attempting to pull port out. She was also trying to throw everything on bedside table on floor. She is very depressed and scared. She endorses multiple times the desire to just be finished with it all and "die". Mittens placed on patient, MD made aware. Seroquel ordered. Pharmacist made aware of Heparin and meds being changed to Xarelto. RN here to comfort patient and updated family members. RN will give Seroquel and Xarelto when family at bedside.

## 2019-06-28 NOTE — Progress Notes (Signed)
[facility] for Infectious Disease   Reason for visit: Follow up on fever  Interval History: fever curve down.  CSF and ascitic fluid cells noted.  Tmax 100.8.  No new complaints.  No rash or diarrhea.    Physical Exam: Constitutional:  Vitals:   06/28/19 0600 06/28/19 0800  BP: (!) 129/91   Pulse: (!) 112   Resp: 17   Temp: 100 F (37.8 C) 99.7 F (37.6 C)  SpO2: 98%    patient appears in NAD Eyes: anicteric Respiratory: Normal respiratory effort; CTA B Cardiovascular: RRR GI: soft, nt, nd  Review of Systems: Constitutional: negative for sweats Gastrointestinal: negative for nausea and diarrhea Integument/breast: negative for rash  Lab Results  Component Value Date   WBC 10.6 (H) 06/27/2019   HGB 8.8 (L) 06/27/2019   HCT 29.1 (L) 06/27/2019   MCV 89.5 06/27/2019   PLT 418 (H) 06/27/2019    Lab Results  Component Value Date   CREATININE 0.58 06/25/2019   BUN 19 06/25/2019   NA 141 06/25/2019   K 3.9 06/25/2019   CL 115 (H) 06/25/2019   CO2 18 (L) 06/25/2019    Lab Results  Component Value Date   ALT 12 06/25/2019   AST 12 (L) 06/25/2019   ALKPHOS 70 06/25/2019     Microbiology: Recent Results (from the past 240 hour(s))  Culture, blood (Routine x 2)     Status: None   Collection Time: 06/20/19  8:00 PM   Specimen: BLOOD  Result Value Ref Range Status   Specimen Description   Final    BLOOD CHEST PORTA CATH Performed at [facility], [address] [place], [place] [address]    Special Requests   Final    BOTTLES DRAWN AEROBIC AND ANAEROBIC Blood Culture adequate volume Performed at [facility], [address] [place], [place] [address]    Culture   Final    NO GROWTH 5 DAYS Performed at [facility], [place] [address] [place], [place] [address]    Report Status 06/25/2019 FINAL  Final  Culture, blood (Routine x 2)     Status: None   Collection Time: 06/20/19  8:10 PM   Specimen: BLOOD  Result Value Ref  Range Status   Specimen Description   Final    BLOOD RIGHT ANTECUBITAL Performed at [facility], [address] [place], [place] [address]    Special Requests   Final    BOTTLES DRAWN AEROBIC AND ANAEROBIC Blood Culture adequate volume Performed at [facility], [address] [place], [place] [address]    Culture   Final    NO GROWTH 5 DAYS Performed at [facility], [place] [address], [place], [place] [address]    Report Status 06/25/2019 FINAL  Final  SARS Coronavirus 2 [facility] order, Performed in [facility] hospital lab) Nasopharyngeal Nasopharyngeal Swab     Status: None   Collection Time: 06/20/19  8:19 PM   Specimen: Nasopharyngeal Swab  Result Value Ref Range Status   SARS Coronavirus 2 NEGATIVE NEGATIVE Final    Comment: (NOTE) If result is NEGATIVE SARS-CoV-2 target nucleic acids are NOT DETECTED. The SARS-CoV-2 RNA is generally detectable in upper and lower  respiratory specimens during the acute phase of infection. The lowest  concentration of SARS-CoV-2 viral copies this assay can detect is 250  copies / mL. A negative result does not preclude SARS-CoV-2 infection  and should not be used as the sole basis for treatment or other  patient management decisions.  A negative result may occur with  improper specimen collection / handling, submission of specimen other  than nasopharyngeal swab, presence of viral mutation(s) within the  areas targeted by this assay, and inadequate number of viral copies  (<250 copies / mL). A negative result must be combined with clinical  observations, patient history, and epidemiological information. If result is POSITIVE SARS-CoV-2 target nucleic acids are DETECTED. The SARS-CoV-2 RNA is generally detectable in upper and lower  respiratory specimens dur ing the acute phase of infection.  Positive  results are indicative of active infection with SARS-CoV-2.  Clinical  correlation with patient history and  other diagnostic information is  necessary to determine patient infection status.  Positive results do  not rule out bacterial infection or co-infection with other viruses. If result is PRESUMPTIVE POSTIVE SARS-CoV-2 nucleic acids MAY BE PRESENT.   A presumptive positive result was obtained on the submitted specimen  and confirmed on repeat testing.  While 2019 novel coronavirus  (SARS-CoV-2) nucleic acids may be present in the submitted sample  additional confirmatory testing may be necessary for epidemiological  and / or clinical management purposes  to differentiate between  SARS-CoV-2 and other Sarbecovirus currently known to infect humans.  If clinically indicated additional testing with an alternate test  methodology [phone]) is advised. The SARS-CoV-2 RNA is generally  detectable in upper and lower respiratory sp ecimens during the acute  phase of infection. The expected result is Negative. Fact Sheet for Patients:  StrictlyIdeas.no Fact Sheet for Healthcare Providers: BankingDealers.co.za This test is not yet approved or cleared by the [place] FDA and has been authorized for detection and/or diagnosis of SARS-CoV-2 by FDA under an Emergency Use Authorization (EUA).  This EUA will remain in effect (meaning this test can be used) for the duration of the COVID-19 declaration under Section 564(b)(1) of the Act, 21 U.S.C. section 360bbb-3(b)(1), unless the authorization is terminated or revoked sooner. Performed at [facility], [address] [place], [place] [address]   Urine culture     Status: None   Collection Time: 06/20/19  8:32 PM   Specimen: In/Out Cath Urine  Result Value Ref Range Status   Specimen Description   Final    IN/OUT CATH URINE Performed at [facility], [address] [place], [place] [address]    Special Requests   Final    NONE Performed at [facility], [address] [place], [place] [address]    Culture   Final    NO GROWTH Performed at [facility], [place] [address], [place], [place] [address]    Report Status 06/21/2019 FINAL  Final  MRSA PCR Screening     Status: None   Collection Time: 06/21/19  3:53 AM   Specimen: Nasal Mucosa; Nasopharyngeal  Result Value Ref Range Status   MRSA by PCR NEGATIVE NEGATIVE Final    Comment:        The GeneXpert MRSA Assay (FDA approved for NASAL specimens only), is one component of a comprehensive MRSA colonization surveillance program. It is not intended to diagnose MRSA infection nor to guide or monitor treatment for MRSA infections. Performed at [facility], [place] [address] [place], [place] [address]   Culture, blood (Routine X 2) w Reflex to ID Panel     Status: None (Preliminary result)  Collection Time: 06/26/19  1:40 PM   Specimen: BLOOD RIGHT HAND  Result Value Ref Range Status   Specimen Description   Final    BLOOD RIGHT HAND Performed at [facility] [address], [place], [place] [address]    Special Requests   Final    BOTTLES DRAWN AEROBIC ONLY Blood Culture results may not be optimal due to an inadequate volume of blood received in culture bottles Performed at [facility] [address] [place], [place] [address]    Culture   Final    NO GROWTH < 24 HOURS Performed at [facility] [address] [place], [place] [address]    Report Status PENDING  Incomplete  Culture, blood (Routine X 2) w Reflex to ID Panel     Status: None (Preliminary result)   Collection Time: 06/26/19  1:40 PM   Specimen: BLOOD  Result Value Ref Range Status   Specimen Description   Final    BLOOD LEFT ANTECUBITAL Performed at [facility] [address], [place], [place] [address]    Special Requests   Final    BOTTLES DRAWN AEROBIC ONLY Blood Culture results may not be optimal due to an  inadequate volume of blood received in culture bottles Performed at [facility] [address] [place], [place] [address]    Culture  Setup Time   Final    AEROBIC BOTTLE ONLY GRAM POSITIVE COCCI CRITICAL RESULT CALLED TO, READ BACK BY AND VERIFIED WITH: PHARMD [id number] AT 701 AM BY CM Performed at [facility], [place] [address], [place],  [address]    Culture GRAM POSITIVE COCCI  Final   Report Status PENDING  Incomplete  Blood Culture ID Panel (Reflexed)     Status: Abnormal   Collection Time: 06/26/19  1:40 PM  Result Value Ref Range Status   Enterococcus species NOT DETECTED NOT DETECTED Final   Listeria monocytogenes NOT DETECTED NOT DETECTED Final   Staphylococcus species DETECTED (A) NOT DETECTED Final    Comment: Methicillin (oxacillin) resistant coagulase negative staphylococcus. Possible blood culture contaminant (unless isolated from more than one blood culture draw or clinical case suggests pathogenicity). No antibiotic treatment is indicated for blood  culture contaminants. CRITICAL RESULT CALLED TO, READ BACK BY AND VERIFIED WITH: PHARMD [id number] AT 18 AM BY CM    Staphylococcus aureus (BCID) NOT DETECTED NOT DETECTED Final   Methicillin resistance DETECTED (A) NOT DETECTED Final    Comment: CRITICAL RESULT CALLED TO, READ BACK BY AND VERIFIED WITH: PHARMD T GREEN [id number] AT 47 BY CM    Streptococcus species NOT DETECTED NOT DETECTED Final   Streptococcus agalactiae NOT DETECTED NOT DETECTED Final   Streptococcus pneumoniae NOT DETECTED NOT DETECTED Final   Streptococcus pyogenes NOT DETECTED NOT DETECTED Final   Acinetobacter baumannii NOT DETECTED NOT DETECTED Final   Enterobacteriaceae species NOT DETECTED NOT DETECTED Final   Enterobacter cloacae complex NOT DETECTED NOT DETECTED Final   Escherichia coli NOT DETECTED NOT DETECTED Final   Klebsiella oxytoca NOT DETECTED NOT DETECTED Final   Klebsiella pneumoniae NOT DETECTED NOT  DETECTED Final   Proteus species NOT DETECTED NOT DETECTED Final   Serratia marcescens NOT DETECTED NOT DETECTED Final   Haemophilus influenzae NOT DETECTED NOT DETECTED Final   Neisseria meningitidis NOT DETECTED NOT DETECTED Final   Pseudomonas aeruginosa NOT DETECTED NOT DETECTED Final   Candida albicans NOT DETECTED NOT DETECTED Final  Candida glabrata NOT DETECTED NOT DETECTED Final   Candida krusei NOT DETECTED NOT DETECTED Final   Candida parapsilosis NOT DETECTED NOT DETECTED Final   Candida tropicalis NOT DETECTED NOT DETECTED Final    Comment: Performed at [facility], [place] [address], [place], [place] [address]  Gram stain     Status: None   Collection Time: 06/27/19 11:45 AM   Specimen: Abdomen  Result Value Ref Range Status   Specimen Description ABDOMEN  Final   Special Requests NONE  Final   Gram Stain   Final    RARE WBC PRESENT,BOTH PMN AND MONONUCLEAR NO ORGANISMS SEEN Performed at [facility], [place] [address], [place], [place] [address]    Report Status 06/27/2019 FINAL  Final  CSF culture     Status: None (Preliminary result)   Collection Time: 06/27/19  4:00 PM   Specimen: PATH Cytology CSF; Cerebrospinal Fluid  Result Value Ref Range Status   Specimen Description   Final    CSF Performed at [facility] [address] [place], [place] [address]    Special Requests   Final    NONE Performed at [facility], [place] [address], [place], [place] [address]    Gram Stain   Final    WBC PRESENT,BOTH PMN AND MONONUCLEAR NO ORGANISMS SEEN CYTOSPIN SMEAR    Culture   Final    NO GROWTH < 24 HOURS Performed at [facility], [place] [address], [place], [place] [address]    Report Status PENDING  Incomplete    Impression/Plan:  1. Fever - CSF and paracentesis fluid with minimal WBCs, not c/w infectious etiology.  I discussed the results with the husband via phone.  Most c/w non-infectious etiology.    Continue with supportive care  2.  Ascites - over 2 liters removed.  364 WBCs noted.  No treatment indicated.  3.  AMS -s/p LP and CSF with just 4 WBCs.  No treatment indicated.    I will sign off, thanks for consultation

## 2019-06-28 NOTE — Progress Notes (Addendum)
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 Patient recently was diagnosed with E. coli UTI which was pansensitive and was treated with ciprofloxacin. Patient  Also reported abdominal pain. Hospital course complicated by persistent fevers with no clear source, AKI, anemia requiring blood transfusion and SBO in the setting of peritoneal carcinomatosis prompting GS consult-managed medically. Initially started on vancomycin and cefepime for presumed sepsis.  Unclear source of infection.Patient completed 5 days of antibiotic therapy.  Blood cultures / Urine culture showed no growth. Now felt to be drug induced fever, being monitored off antibiotics. Mental status has improved.   Subjective: Patient appears comfortable and more alert since yesterday. Says eating better about 30%. T-max of 100.4 overnight. Patient has had intermittent delusional/paranoid thoughts over last few days.   Objective: Vitals:   06/28/19 0800 06/28/19 0900 06/28/19 1000 06/28/19 1100  BP:   (!) 131/91   Pulse: (!) 124 (!) 106 (!) 120 (!) 104  Resp: (!) 27 15 (!) 23 17  Temp: 99.7 F (37.6 C) (!) 100.4 F (38 C) (!) 100.4 F (38 C) 100.2 F (37.9 C)  TempSrc:      SpO2: 100% 97% 100% 98%  Weight:      Height:        Intake/Output Summary (Last 24 hours) at 06/28/2019 1248 Last data filed at 06/28/2019 0308 Gross per 24 hour  Intake 2015.78 ml  Output 1200 ml  Net 815.78 ml   Filed Weights   06/20/19 2014  Weight: 63.5 kg    Physical  Examination:  General exam: Appears weak but no respiratory distress and comfortable  Respiratory system: Clear to auscultation. Respiratory effort normal. Cardiovascular system: S1 & S2 heard. No JVD, murmurs.  1+ pedal edema. Gastrointestinal system: Abdomen is nondistended, soft and nontender. No organomegaly or masses felt. Normal bowel sounds heard. Central nervous system: Alert and oriented. No focal neurological deficits.  Generalized weakness with decreased strength 4/5 in upper extremities, 2/5 in lower extremities right > left Skin: No rashes, lesions or ulcers Psychiatry: Oriented x3, judgement and insight appear normal. Mood & affect flat.    Data Reviewed: I have personally reviewed following labs and imaging studies  CBC: Recent Labs  Lab 06/23/19 0508 06/24/19 0431 06/25/19 0154 06/26/19 0504 06/27/19 0157  WBC 7.7 7.4 9.2 13.0* 10.6*  HGB 6.8* 9.5* 8.2* 8.4* 8.8*  HCT 21.8* 30.0* 26.4* 27.8* 29.1*  MCV 87.9 87.0 87.4 90.8 89.5  PLT 351 415* 376 384 [id number]*   Basic Metabolic Panel: Recent Labs  Lab 06/22/19 0525 06/23/19 0508 06/24/19 0431 06/24/19 0954 06/25/19 0154  NA 136 139 141  --  141  K 3.2* 3.1* 2.8*  --  3.9  CL 108 114* 115*  --  115*  CO2 20* 21* 18*  --  18*  GLUCOSE 113* 135* 123*  --  132*  BUN 19 18 17   --  19  CREATININE 0.78 0.59 0.55  --  0.58  CALCIUM 8.0* 8.0* 8.2*  --  7.7*  MG  --  1.8  --  1.6*  --  PHOS  --  2.0*  --   --   --    GFR: Estimated Creatinine Clearance: 70.2 mL/min (by C-G formula based on SCr of 0.58 mg/dL). Liver Function Tests: Recent Labs  Lab 06/22/19 0525 06/24/19 0431 06/25/19 0154  AST 15 14* 12*  ALT 18 18 12   ALKPHOS 82 84 70  BILITOT 0.3 0.4 0.7  PROT 5.2* 5.2* 4.7*  ALBUMIN 2.1* 2.0* 1.7*   No results for input(s): LIPASE, AMYLASE in the last 168 hours. No results for input(s): AMMONIA in the last 168 hours. Coagulation Profile: Recent Labs  Lab 06/27/19 0856  INR 1.2   Cardiac  Enzymes: No results for input(s): CKTOTAL, CKMB, CKMBINDEX, TROPONINI in the last 168 hours. BNP (last 3 results) No results for input(s): PROBNP in the last 8760 hours. HbA1C: No results for input(s): HGBA1C in the last 72 hours. CBG: Recent Labs  Lab 06/27/19 1225 06/27/19 1627 06/27/19 1935 06/27/19 2351 06/28/19 0404  GLUCAP 94 75 92 101* 98   Lipid Profile: No results for input(s): CHOL, HDL, LDLCALC, TRIG, CHOLHDL, LDLDIRECT in the last 72 hours. Thyroid Function Tests: No results for input(s): TSH, T4TOTAL, FREET4, T3FREE, THYROIDAB in the last 72 hours. Anemia Panel: No results for input(s): VITAMINB12, FOLATE, FERRITIN, TIBC, IRON, RETICCTPCT in the last 72 hours. Sepsis Labs: Recent Labs  Lab 06/25/19 1319 06/26/19 0504 06/27/19 0157  PROCALCITON 1.16 <0.10 2.16    Recent Results (from the past 240 hour(s))  Culture, blood (Routine x 2)     Status: None   Collection Time: 06/20/19  8:00 PM   Specimen: BLOOD  Result Value Ref Range Status   Specimen Description   Final    BLOOD CHEST PORTA CATH Performed at [facility], [address] [place], [place] [address]    Special Requests   Final    BOTTLES DRAWN AEROBIC AND ANAEROBIC Blood Culture adequate volume Performed at [facility], [address], [place], [place] [address]    Culture   Final    NO GROWTH 5 DAYS Performed at [facility], [place] [address], [place], [place] [address]    Report Status 06/25/2019 FINAL  Final  Culture, blood (Routine x 2)     Status: None   Collection Time: 06/20/19  8:10 PM   Specimen: BLOOD  Result Value Ref Range Status   Specimen Description   Final    BLOOD RIGHT ANTECUBITAL Performed at [facility], [address] [place], [place] [address]    Special Requests   Final    BOTTLES DRAWN AEROBIC AND ANAEROBIC Blood Culture adequate volume Performed at [facility], [address] [place], [place] [address]     Culture   Final    NO GROWTH 5 DAYS Performed at [facility], [place] [address], [place], [place] [address]    Report Status 06/25/2019 FINAL  Final  SARS Coronavirus 2 [facility] order, Performed in [facility] hospital lab) Nasopharyngeal Nasopharyngeal Swab     Status: None   Collection Time: 06/20/19  8:19 PM   Specimen: Nasopharyngeal Swab  Result Value Ref Range Status   SARS Coronavirus 2 NEGATIVE NEGATIVE Final    Comment: (NOTE) If result is NEGATIVE SARS-CoV-2 target nucleic acids are NOT DETECTED. The SARS-CoV-2 RNA is generally detectable in upper and lower  respiratory specimens during the acute phase of infection. The lowest  concentration of SARS-CoV-2 viral copies  this assay can detect is 250  copies / mL. A negative result does not preclude SARS-CoV-2 infection  and should not be used as the sole basis for treatment or other  patient management decisions.  A negative result may occur with  improper specimen collection / handling, submission of specimen other  than nasopharyngeal swab, presence of viral mutation(s) within the  areas targeted by this assay, and inadequate number of viral copies  (<250 copies / mL). A negative result must be combined with clinical  observations, patient history, and epidemiological information. If result is POSITIVE SARS-CoV-2 target nucleic acids are DETECTED. The SARS-CoV-2 RNA is generally detectable in upper and lower  respiratory specimens dur ing the acute phase of infection.  Positive  results are indicative of active infection with SARS-CoV-2.  Clinical  correlation with patient history and other diagnostic information is  necessary to determine patient infection status.  Positive results do  not rule out bacterial infection or co-infection with other viruses. If result is PRESUMPTIVE POSTIVE SARS-CoV-2 nucleic acids MAY BE PRESENT.   A presumptive positive result was obtained on the submitted specimen  and confirmed on  repeat testing.  While 2019 novel coronavirus  (SARS-CoV-2) nucleic acids may be present in the submitted sample  additional confirmatory testing may be necessary for epidemiological  and / or clinical management purposes  to differentiate between  SARS-CoV-2 and other Sarbecovirus currently known to infect humans.  If clinically indicated additional testing with an alternate test  methodology [id number]) is advised. The SARS-CoV-2 RNA is generally  detectable in upper and lower respiratory sp ecimens during the acute  phase of infection. The expected result is Negative. Fact Sheet for Patients:  StrictlyIdeas.no Fact Sheet for Healthcare Providers: BankingDealers.co.za This test is not yet approved or cleared by the [place] FDA and has been authorized for detection and/or diagnosis of SARS-CoV-2 by FDA under an Emergency Use Authorization (EUA).  This EUA will remain in effect (meaning this test can be used) for the duration of the COVID-19 declaration under Section 564(b)(1) of the Act, 21 U.S.C. section 360bbb-3(b)(1), unless the authorization is terminated or revoked sooner. Performed at [facility], [address] [place], [place] [address]   Urine culture     Status: None   Collection Time: 06/20/19  8:32 PM   Specimen: In/Out Cath Urine  Result Value Ref Range Status   Specimen Description   Final    IN/OUT CATH URINE Performed at [facility], [address] [place], [place] [address]    Special Requests   Final    NONE Performed at [facility], [address] [place], [place] [address]    Culture   Final    NO GROWTH Performed at [facility], [place] [address], [place], [place] [address]    Report Status 06/21/2019 FINAL  Final  MRSA PCR Screening     Status: None   Collection Time: 06/21/19  3:53 AM   Specimen: Nasal Mucosa; Nasopharyngeal  Result Value Ref Range Status    MRSA by PCR NEGATIVE NEGATIVE Final    Comment:        The GeneXpert MRSA Assay (FDA approved for NASAL specimens only), is one component of a comprehensive MRSA colonization surveillance program. It is not intended to diagnose MRSA infection nor to guide or monitor treatment for MRSA infections. Performed at [facility], [place] [address], [place], [place] [address]  Culture, blood (Routine X 2) w Reflex to ID Panel     Status: None (Preliminary result)   Collection Time: 06/26/19  1:40 PM   Specimen: BLOOD RIGHT HAND  Result Value Ref Range Status   Specimen Description   Final    BLOOD RIGHT HAND Performed at [place] [address] [place], [place] [address]    Special Requests   Final    BOTTLES DRAWN AEROBIC ONLY Blood Culture results may not be optimal due to an inadequate volume of blood received in culture bottles Performed at [place] [address], [place], [place] [address]    Culture   Final    NO GROWTH 2 DAYS Performed at [place] [address] [place], [place] [address]    Report Status PENDING  Incomplete  Culture, blood (Routine X 2) w Reflex to ID Panel     Status: None (Preliminary result)   Collection Time: 06/26/19  1:40 PM   Specimen: BLOOD  Result Value Ref Range Status   Specimen Description   Final    BLOOD LEFT ANTECUBITAL Performed at [place] [address], [place], [place] [address]    Special Requests   Final    BOTTLES DRAWN AEROBIC ONLY Blood Culture results may not be optimal due to an inadequate volume of blood received in culture bottles Performed at [place] [address], [place], [place] [address]    Culture  Setup Time   Final    AEROBIC BOTTLE ONLY GRAM POSITIVE COCCI CRITICAL RESULT CALLED TO, READ BACK BY AND VERIFIED WITH: PHARMD [id number] AT 701 AM BY CM Performed at [facility], [place]  [address] [place], [place] [address]    Culture GRAM POSITIVE COCCI  Final   Report Status PENDING  Incomplete  Blood Culture ID Panel (Reflexed)     Status: Abnormal   Collection Time: 06/26/19  1:40 PM  Result Value Ref Range Status   Enterococcus species NOT DETECTED NOT DETECTED Final   Listeria monocytogenes NOT DETECTED NOT DETECTED Final   Staphylococcus species DETECTED (A) NOT DETECTED Final    Comment: Methicillin (oxacillin) resistant coagulase negative staphylococcus. Possible blood culture contaminant (unless isolated from more than one blood culture draw or clinical case suggests pathogenicity). No antibiotic treatment is indicated for blood  culture contaminants. CRITICAL RESULT CALLED TO, READ BACK BY AND VERIFIED WITH: PHARMD [id number] AT 48 AM BY CM    Staphylococcus aureus (BCID) NOT DETECTED NOT DETECTED Final   Methicillin resistance DETECTED (A) NOT DETECTED Final    Comment: CRITICAL RESULT CALLED TO, READ BACK BY AND VERIFIED WITH: PHARMD T GREEN [id number] AT 34 BY CM    Streptococcus species NOT DETECTED NOT DETECTED Final   Streptococcus agalactiae NOT DETECTED NOT DETECTED Final   Streptococcus pneumoniae NOT DETECTED NOT DETECTED Final   Streptococcus pyogenes NOT DETECTED NOT DETECTED Final   Acinetobacter baumannii NOT DETECTED NOT DETECTED Final   Enterobacteriaceae species NOT DETECTED NOT DETECTED Final   Enterobacter cloacae complex NOT DETECTED NOT DETECTED Final   Escherichia coli NOT DETECTED NOT DETECTED Final   Klebsiella oxytoca NOT DETECTED NOT DETECTED Final   Klebsiella pneumoniae NOT DETECTED NOT DETECTED Final   Proteus species NOT DETECTED NOT DETECTED Final   Serratia marcescens NOT DETECTED NOT DETECTED Final   Haemophilus influenzae NOT DETECTED NOT DETECTED Final   Neisseria meningitidis NOT DETECTED NOT DETECTED Final  Pseudomonas aeruginosa NOT DETECTED NOT DETECTED Final   Candida albicans NOT DETECTED NOT DETECTED Final   Candida  glabrata NOT DETECTED NOT DETECTED Final   Candida krusei NOT DETECTED NOT DETECTED Final   Candida parapsilosis NOT DETECTED NOT DETECTED Final   Candida tropicalis NOT DETECTED NOT DETECTED Final    Comment: Performed at [facility], [place] [address], [place], [place] [address]  Culture, body fluid-bottle     Status: None (Preliminary result)   Collection Time: 06/27/19 11:45 AM   Specimen: Abdomen  Result Value Ref Range Status   Specimen Description ABDOMEN  Final   Special Requests NONE  Final   Culture   Final    NO GROWTH < 24 HOURS Performed at [facility], [place] [address], [place], [place] [address]    Report Status PENDING  Incomplete  Gram stain     Status: None   Collection Time: 06/27/19 11:45 AM   Specimen: Abdomen  Result Value Ref Range Status   Specimen Description ABDOMEN  Final   Special Requests NONE  Final   Gram Stain   Final    RARE WBC PRESENT,BOTH PMN AND MONONUCLEAR NO ORGANISMS SEEN Performed at [facility], [address], [place], [place] [address]    Report Status 06/27/2019 FINAL  Final  CSF culture     Status: None (Preliminary result)   Collection Time: 06/27/19  4:00 PM   Specimen: PATH Cytology CSF; Cerebrospinal Fluid  Result Value Ref Range Status   Specimen Description   Final    CSF Performed at [place] [address] [place], [place] [address]    Special Requests   Final    NONE Performed at [facility], [place] [address], [place], [place] [address]    Gram Stain   Final    WBC PRESENT,BOTH PMN AND MONONUCLEAR NO ORGANISMS SEEN CYTOSPIN SMEAR    Culture   Final    NO GROWTH < 24 HOURS Performed at [facility], [place] [address], [place], [place] [address]    Report Status PENDING  Incomplete      Radiology Studies: Ct Head Wo Contrast  Result Date: 06/27/2019 CLINICAL DATA:  53 year old female with metastatic breast cancer. Known spinal cord involvement.  Encephalopathy. EXAM: CT HEAD WITHOUT CONTRAST TECHNIQUE: Contiguous axial images were obtained from the base of the skull through the vertex without intravenous contrast. COMPARISON:  None. FINDINGS: Brain: Partially empty sella. No midline shift, ventriculomegaly, mass effect, evidence of mass lesion, intracranial hemorrhage or evidence of cortically based acute infarction. Gray-white matter differentiation is within normal limits throughout the brain. No contrast administered. Vascular: No suspicious intracranial vascular hyperdensity. Skull: Hyperostosis of the calvarium with no destructive or suspicious skull lesion identified. Sinuses/Orbits: Partially visible left maxillary sinus opacification but the other visualized paranasal sinuses and mastoids are clear. Other: Visualized orbits and scalp soft tissues are within normal limits. IMPRESSION: 1. Negative noncontrast CT appearance of the brain. Brain MRI without and with contrast would be most sensitive for early metastatic disease. 2. No skull metastasis identified. Partially visible opacification of the left maxillary sinus. Electronically Signed   By: Genevie Ann M.D.   On: 06/27/2019 12:23   US Paracentesis  Result Date: 06/27/2019 INDICATION: Patient with history of metastatic breast cancer, small-bowel obstruction, fever, encephalopathy, PE, cauda equina syndrome, ascites. Request made for diagnostic and therapeutic paracentesis. EXAM: ULTRASOUND GUIDED DIAGNOSTIC AND THERAPEUTIC PARACENTESIS MEDICATIONS: None COMPLICATIONS:  None immediate. PROCEDURE: Informed written consent was obtained from the patient's husband after a discussion of the risks, benefits and alternatives to treatment. A timeout was performed prior to the initiation of the procedure. Initial ultrasound scanning demonstrates a small to moderate amount of ascites within the left mid to lower abdominal quadrant. The left mid to lower abdomen was prepped and draped in the usual sterile  fashion. 1% lidocaine was used for local anesthesia. Following this, a 19 gauge, 7-cm, Yueh catheter was introduced. An ultrasound image was saved for documentation purposes. The paracentesis was performed. The catheter was removed and a dressing was applied. The patient tolerated the procedure well without immediate post procedural complication. FINDINGS: A total of approximately 2.2 liters of yellow fluid was removed. Samples were sent to the laboratory as requested by the clinical team. IMPRESSION: Successful ultrasound-guided diagnostic and therapeutic paracentesis yielding 2.2 liters of peritoneal fluid. Read by: Rowe Robert, PA-C Electronically Signed   By: Jacqulynn Cadet M.D.   On: 06/27/2019 12:46   Dg Fluoro Guided Needle Plc Aspiration/injection Loc  Result Date: 06/27/2019 CLINICAL DATA:  History of breast cancer, fevers, altered mental status. EXAM: DIAGNOSTIC LUMBAR PUNCTURE UNDER FLUOROSCOPIC GUIDANCE FLUOROSCOPY TIME:  Fluoroscopy Time:  30 seconds Radiation Exposure Index (if provided by the fluoroscopic device): 3.7 mGy Number of Acquired Spot Images: 0 PROCEDURE: Informed consent was obtained from the patient prior to the procedure, including potential complications of headache, allergy, and pain. With the patient prone, the lower back was prepped with Betadine. 1% Lidocaine was used for local anesthesia. Lumbar puncture was performed at the L2-L3 level using a 22 gauge needle with return of clear CSF with normal-appearing opening pressure. Ten ml of CSF were obtained for laboratory studies. The patient tolerated the procedure well and there were no apparent complications. IMPRESSION: 1. Technically successful lumbar puncture, 10 cc obtained and sent for requested testing. Electronically Signed   By: Zetta Bills M.D.   On: 06/27/2019 17:30        Scheduled Meds:  chlorhexidine  15 mL Mouth Rinse BID   Chlorhexidine Gluconate Cloth  6 each Topical Daily   famotidine  20 mg  Oral Daily   feeding supplement  1 Container Oral Q24H   feeding supplement (ENSURE ENLIVE)  237 mL Oral Q24H   feeding supplement (PRO-STAT SUGAR FREE 64)  30 mL Oral Daily   gabapentin  100 mg Oral TID   mouth rinse  15 mL Mouth Rinse q12n4p   mirtazapine  7.5 mg Oral QHS   multivitamin with minerals  1 tablet Oral Daily   sodium chloride flush  10-40 mL Intracatheter Q12H   tiZANidine  4 mg Oral TID   Continuous Infusions:  dextrose 5 % and 0.45 % NaCl with KCl 20 mEq/L 100 mL/hr at 06/28/19 0304   heparin 1,250 Units/hr (06/28/19 1133)    Assessment & Plan:    1.  Small bowel obstruction: CT abdomen on admission revealed SBO with metastatic disease/peritoneal carcinomatosis.  Seen by general surgery and managed conservatively.  She did have bowel movements during the hospital course, tolerating soft diet and general surgery has signed off. Oral intake limited due to poor appetite rather than GI intolerance.    2.  Fever with systemic inflammatory response syndrome POA: Procalcitonin at 3.2 on presentation-> 1.16--> now <0.10.  Urine cultures and blood cultures so far negative.  Patient started on vancomycin and cefepime empirically x5 days which are now discontinued in concern for possible drug-induced  fever.   Procalcitonin levels have come down, no white count.  Seen by ID and underwent diagnostic/therapeutic paracentesis as well as lumbar puncture for body fluid/CSF cultures.  Preliminary body fluid labs do not indicate infection.  Fever could be drug-induced versus inflammatory response to recent chemotherapy/tumor itself.  Sepsis ruled out.  Continue to monitor off antibiotics per ID  3.  Metabolic encephalopathy/Delirium with paranoia:  in the setting of problem #1 and 2.  CT head negative for any acute findings.  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  CSF sent for cytology on 10/7. No evidence of CNS infection. Given paranoia ,  will try seroquel low dose (qtc okay) and change tizanidine to PRN use. D/C ativan. Can continue remeron/neurontin for now.   4.  Breast cancer with diffuse metastasis-peritoneal carcinomatosis/spinal cord/adrenal mets: Patient was following [facility] and was undergoing chemotherapy-last session a week prior to admission.  Patient over the last couple of days expressed wishes to avoid aggressive interventions.  She is still not in favor for feeding tubes/mechanical ventilation.  She discussed with her husband and children overnight.  CODE STATUS now changed to DNI.  Husband states they would like to consider palliative chemo to prolong life even if it is for couple of weeks where she could spend time at home with family.  Palliative care following and also assisting in discussions regarding care goals. I have requested Oncology Dr Lindi Adie to evaluate patient and assist in clarifying benefit vs risks of further chemotherapy for this unfortunate patient with diffuse metastasis.  5.  History of pulmonary embolism: On Xarelto at home--> been on heparin given SBO/ IR procedures, Will transition back to Xarelto.   6.  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  Patient has bilateral leg weakness right more than left.  Foley catheter has been placed.  7.  Acute on chronic anemia: S/p blood transfusion.  Hemoglobin improved to 8.2  8.  Ascitis : in the setting of peritoneal carcinomatosis. S/p paracentesis on 10/8 . No signs of infection. ID does not recommend any antibiotics.   9.  FEN-severe PCM: Patient continues to have poor appetite and barely ate 20% of her meals.  She feels she might do better with soft diet.  She states she does not want feeding tubes.  Her albumin level is less than 2 and likely contributing to her lower extremity edema/moderate ascitis.  10. Sacral decubitus stage II: Local wound care   DVT prophylaxis: On heparin IV Code Status: DNI per patient's  wish Family / Patient Communication: Discussed with patient earlier today.  Also discussed with husband/palliative care MD/Dr Lindi Adie.  Disposition Plan: Home with home health services per case management note. Unfortunately Ms. Mcguyer has very poor prognosis and is getting tired of multiple interventions.  Palliative care following along.   LOS: 8 days    Time spent: [place]    Guilford Shi, MD [facility] Pager [phone]  If 7PM-7AM, please contact night-coverage [email or web address] Password [email or web address] 06/28/2019, 12:48 PM

## 2019-06-28 NOTE — Progress Notes (Signed)
START ON PATHWAY REGIMEN - Breast     Administer weekly:     Carboplatin   **Always confirm dose/schedule in your pharmacy ordering system**  Patient Characteristics: Distant Metastases or Locoregional Recurrent Disease - Unresected or Locally Advanced Unresectable Disease Progressing after Neoadjuvant and Local Therapies, HER2 Negative/Unknown/Equivocal, ER Negative/Unknown, Chemotherapy, Third Line and Beyond, Prior  or Contraindicated Anthracycline and Prior or Contraindicated Eribulin Therapeutic Status: Distant Metastases BRCA Mutation Status: Absent ER Status: Negative (-) HER2 Status: Negative (-) PR Status: Negative (-) Line of Therapy: Third Line and Beyond Intent of Therapy: Non-Curative / Palliative Intent, Discussed with Patient

## 2019-06-28 NOTE — Progress Notes (Signed)
[place] NOTE  Patient Care Team: Maury Dus, MD as PCP - General (Family Medicine)  CHIEF COMPLAINTS/PURPOSE OF CONSULTATION:  Metastatic breast cancer being treated at [facility] by Dr. Harriett Rush.   HISTORY OF PRESENTING ILLNESS:  Kaylee Randolph 53 y.o. female is here with a prior history of triple negative breast cancer treated with neoadjuvant chemotherapy and surgery.  Apparently she had a complete pathologic response.  Unfortunately she had metastatic disease and was treated with oral capecitabine.  She has been seeing Dr. Wetzel Bjornstad who recommended starting her on carboplatin because of progression of disease.  She was supposed to receive the first dose of carboplatin last week but it got postponed because she had a E. coli UTI. She has been admitted to the hospital at [facility] long with fevers and change in mental status.  Patient does not have decision-making capacity based upon clinical evaluation.  Her husband was at the bedside.  Extensive infection work-up was performed which included lumbar puncture and also ascites fluid aspiration.  Finally it was felt to be drug fever and antibiotics were discontinued.  In spite of that she continued to have temperature on a daily basis. Dr. Wetzel Bjornstad reached out to me to see if we can assist her from oncology standpoint.  Patient husband has met with palliative care and has very realistic expectations.  He wants her to give chemotherapy 1 more chance so that she may be able to recover from this current hospitalization so that her entire family can have a chance to be with her in the home and prolong her life even for short period.  I reviewed her records extensively and collaborated the history with the patient.  MEDICAL HISTORY:  Past Medical History:  Diagnosis Date  . Cancer ([place])   . Hypertension     SURGICAL HISTORY: Past Surgical History:  Procedure Laterality Date  . BREAST SURGERY      SOCIAL  HISTORY: Social History   Socioeconomic History  . Marital status: Married    Spouse name: Not on file  . Number of children: Not on file  . Years of education: Not on file  . Highest education level: Not on file  Occupational History  . Not on file  Social Needs  . Financial resource strain: Not on file  . Food insecurity    Worry: Not on file    Inability: Not on file  . Transportation needs    Medical: Not on file    Non-medical: Not on file  Tobacco Use  . Smoking status: Never Smoker  . Smokeless tobacco: Never Used  Substance and Sexual Activity  . Alcohol use: Not Currently  . Drug use: Never  . Sexual activity: Never  Lifestyle  . Physical activity    Days per week: Not on file    Minutes per session: Not on file  . Stress: Not on file  Relationships  . Social Herbalist on phone: Not on file    Gets together: Not on file    Attends religious service: Not on file    Active member of club or organization: Not on file    Attends meetings of clubs or organizations: Not on file    Relationship status: Not on file  . Intimate partner violence    Fear of current or ex partner: Not on file    Emotionally abused: Not on file    Physically abused: Not on file  Forced sexual activity: Not on file  Other Topics Concern  . Not on file  Social History Narrative  . Not on file    FAMILY HISTORY: Family History  Problem Relation Age of Onset  . Huntington's disease Mother   . Huntington's disease Sister     ALLERGIES:  has No Known Allergies.  MEDICATIONS:  Current Facility-Administered Medications  Medication Dose Route Frequency Provider Last Rate Last Dose  . acetaminophen (TYLENOL) tablet 650 mg  650 mg Oral Q6H PRN Guilford Shi, MD   650 mg at 06/28/19 1013   Or  . acetaminophen (TYLENOL) suppository 650 mg  650 mg Rectal Q6H PRN Guilford Shi, MD      . chlorhexidine (PERIDEX) 0.12 % solution 15 mL  15 mL Mouth Rinse BID Oswald Hillock, MD   15 mL at 06/28/19 1023  . Chlorhexidine Gluconate Cloth 2 % PADS 6 each  6 each Topical Daily Ivor Costa, MD   6 each at 06/28/19 1022  . dextrose 5 % and 0.45 % NaCl with KCl 20 mEq/L infusion   Intravenous Continuous Oswald Hillock, MD 100 mL/hr at 06/28/19 0304    . famotidine (PEPCID) tablet 20 mg  20 mg Oral Daily Oswald Hillock, MD   20 mg at 06/28/19 1017  . feeding supplement (BOOST / RESOURCE BREEZE) liquid 1 Container  1 Container Oral Q24H Guilford Shi, MD   1 Container at 06/28/19 1013  . feeding supplement (ENSURE ENLIVE) (ENSURE ENLIVE) liquid 237 mL  237 mL Oral Q24H Guilford Shi, MD   237 mL at 06/26/19 2032  . feeding supplement (PRO-STAT SUGAR FREE 64) liquid 30 mL  30 mL Oral Daily Guilford Shi, MD   30 mL at 06/26/19 1307  . gabapentin (NEURONTIN) capsule 100 mg  100 mg Oral TID Oswald Hillock, MD   100 mg at 06/28/19 1013  . HYDROmorphone (DILAUDID) injection 1 mg  1 mg Intravenous Q4H PRN Oswald Hillock, MD   1 mg at 06/28/19 0817  . hyoscyamine (LEVSIN) tablet 0.125-0.25 mg  0.125-0.25 mg Oral Q4H PRN Oswald Hillock, MD      . MEDLINE mouth rinse  15 mL Mouth Rinse q12n4p Oswald Hillock, MD   15 mL at 06/28/19 1228  . metoprolol tartrate (LOPRESSOR) injection 2.5 mg  2.5 mg Intravenous Q6H PRN Guilford Shi, MD   2.5 mg at 06/28/19 0817  . mirtazapine (REMERON) tablet 7.5 mg  7.5 mg Oral QHS Oswald Hillock, MD   7.5 mg at 06/27/19 2331  . multivitamin with minerals tablet 1 tablet  1 tablet Oral Daily Guilford Shi, MD   1 tablet at 06/28/19 1013  . ondansetron (ZOFRAN) tablet 4 mg  4 mg Oral Q6H PRN Ivor Costa, MD       Or  . ondansetron [facility]) injection 4 mg  4 mg Intravenous Q6H PRN Ivor Costa, MD   4 mg at 06/28/19 0817  . phenol (CHLORASEPTIC) mouth spray 1 spray  1 spray Mouth/Throat PRN Oswald Hillock, MD      . promethazine (PHENERGAN) injection 12.5 mg  12.5 mg Intravenous Q6H PRN Oswald Hillock, MD   12.5 mg at 06/28/19 1009  . QUEtiapine  (SEROQUEL) tablet 25 mg  25 mg Oral Daily Kamineni, Lamount Cranker, MD      . rivaroxaban (XARELTO) tablet 20 mg  20 mg Oral Q supper Angela Adam, RPH      . sodium chloride flush (NS)  0.9 % injection 10-40 mL  10-40 mL Intracatheter Q12H Ivor Costa, MD   10 mL at 06/28/19 1022  . sodium chloride flush (NS) 0.9 % injection 10-40 mL  10-40 mL Intracatheter PRN Ivor Costa, MD      . tiZANidine (ZANAFLEX) tablet 4 mg  4 mg Oral Q8H PRN Guilford Shi, MD        REVIEW OF SYSTEMS:   Constitutional: Patient is pleasant but confused and disoriented.  She answers questions bizarre fashion.  She informed me that she does not have any children.  As a matter fact she has 3 children. Eyes: Denies blurriness of vision, double vision or watery eyes Ears, nose, mouth, throat, and face: Denies mucositis or sore throat Respiratory: Denies cough, dyspnea or wheezes Cardiovascular: Denies palpitation, chest discomfort or lower extremity swelling Gastrointestinal:  Denies nausea, heartburn or change in bowel habits Skin: Denies abnormal skin rashes Lymphatics: Denies new lymphadenopathy or easy bruising Neurological:Denies numbness, tingling or new weaknesses Behavioral/Psych: She stated that she wants to die but her family is not allowing her.  All other systems were reviewed with the patient and are negative.  PHYSICAL EXAMINATION: ECOG PERFORMANCE STATUS: 3 - Symptomatic, >50% confined to bed  Vitals:   06/28/19 1100 06/28/19 1200  BP:    Pulse: (!) 104   Resp: 17   Temp: 100.2 F (37.9 C) 99.3 F (37.4 C)  SpO2: 98%    Filed Weights   06/20/19 2014  Weight: 140 lb (63.5 kg)    GENERAL:alert, no distress and comfortable SKIN: skin color, texture, turgor are normal, no rashes or significant lesions EYES: normal, conjunctiva are pink and non-injected, sclera clear OROPHARYNX:no exudate, no erythema and lips, buccal mucosa, and tongue normal  NECK: supple, thyroid normal size, non-tender,  without nodularity LYMPH:  no palpable lymphadenopathy in the cervical, axillary or inguinal LUNGS: clear to auscultation and percussion with normal breathing effort HEART: regular rate & rhythm and no murmurs and no lower extremity edema ABDOMEN:abdomen soft, non-tender and normal bowel sounds Musculoskeletal:no cyanosis of digits and no clubbing  PSYCH: Not oriented.  Answers questions but inappropriate answers. NEURO: Moving all extremities   LABORATORY DATA:  I have reviewed the data as listed Lab Results  Component Value Date   WBC 10.6 (H) 06/27/2019   HGB 8.8 (L) 06/27/2019   HCT 29.1 (L) 06/27/2019   MCV 89.5 06/27/2019   PLT 418 (H) 06/27/2019   Lab Results  Component Value Date   NA 141 06/25/2019   K 3.9 06/25/2019   CL 115 (H) 06/25/2019   CO2 18 (L) 06/25/2019    RADIOGRAPHIC STUDIES: I have personally reviewed the radiological reports and agreed with the findings in the report.  ASSESSMENT AND PLAN:  1.  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  Spine metastasis causing cauda equina syndrome.  In the hospital with intermittent fevers with no source of infection. After lengthy discussion with the patient and her husband we felt that she should get a dose of carboplatin while she is in the hospital. It is possible that the fever might be coming from the metastatic disease itself. Patient's husband fully understands that a single dose of chemotherapy is unlikely to make any dramatic impact on her overall prognosis however it would enable the patient to get home with resolution of fevers, then it would be worth it. Prior history of small bowel obstruction due to peritoneal carcinomatosis.  2.  I will discuss with our  pharmacy team to help facilitate infusion of chemotherapy while she is in the hospital.  3.  Pulmonary embolism on anticoagulation with Xarelto. 4.  Acute change in mental status: According to the nursing  notes, she is slightly better but continues to have disorientation. Overall patient's prognosis is poor   All questions were answered. The patient knows to call the clinic with any problems, questions or concerns.    Harriette Ohara, MD _0 @

## 2019-06-28 NOTE — Progress Notes (Addendum)
ANTICOAGULATION CONSULT NOTE   Pharmacy Consult for heparin  Indication: hx pulmonary embolus (holding xarelto)  No Known Allergies  Patient Measurements: Height: 5\' 4"  (162.6 cm) Weight: 140 lb (63.5 kg) IBW/kg (Calculated) : 54.7 Heparin Dosing Weight: 63 kg  Vital Signs: Temp: 100.2 F (37.9 C) (10/09 1100) BP: 131/91 (10/09 1000) Pulse Rate: 104 (10/09 1100)  Labs: Recent Labs    06/25/19 1319 06/25/19 2042 06/26/19 0504 06/27/19 0157 06/27/19 0856 06/28/19 0025  HGB  --   --  8.4* 8.8*  --   --   HCT  --   --  27.8* 29.1*  --   --   PLT  --   --  384 418*  --   --   APTT 183* 131*  --   --  90*  --   LABPROT  --   --   --   --  14.7  --   INR  --   --   --   --  1.2  --   HEPARINUNFRC 0.52 0.60 0.63 0.21*  --  <0.10*    Estimated Creatinine Clearance: 70.2 mL/min (by C-G formula based on SCr of 0.58 mg/dL).   Medications:  Xarelto 20 mg daily PTA  Assessment: Patient is a 53 y.o F with hx metastatic breast and peritoneal cancer on chemotherapy and PE on xarelto PTA, presented to [facility] ED on 10/1 with c/o fever. Abd CT on 10/2 showed findings consistent with distal SBO. CCS recommends NG tube for decompression and bowel rest.  Pharmacy was consulted to transition patient from [place] to heparin drip on 10/3.  Today, 06/28/2019:  Heparin level subtherapeutic on 1050 units/hr  CBC: Hgb 8.8-low but stable,  Plt stable WNL  No bleeding noted  No infusion related issues reported by nursing  Goal of Therapy:  Heparin level 0.3-0.7 units/ml  Monitor platelets by anticoagulation protocol: Yes   Plan:   Heparin bolus 2000 units x 1 then Increase heparin infusion to 1250 units/hr  Recheck HL in 6 hours  Daily heparin level and CBC while on heparin  Monitor for signs of bleeding or thrombosis  F/u plans to resume Xarelto once no further interventions planned  Dolly Rias RPh 06/28/2019, 11:34 AM Pager [phone]  Rx requested to resume PTA  xarelto  Plan: Stop heparin and give xarelto 20mg  po once daily D/c heparin labs F/u S/S bleeding  Dolly Rias RPh 06/28/2019, 1:20 PM Pager [phone]

## 2019-06-28 NOTE — Progress Notes (Signed)
Daily Progress Note   Patient Name: Kaylee Randolph       Date: 06/28/2019 DOB: 1966/02/21  Age: 53 y.o. MRN#: [id number] Attending Physician: Guilford Shi, MD Primary Care Physician: Maury Dus, MD Admit Date: 06/20/2019  Reason for Consultation/Follow-up: Establishing goals of care  Subjective: Attempted to see Ms. Muma today.  RN and NT in room already and report that she has been more anxious/depressed and possibly paranoid.  I asked how she was doing today, and she immediately stated "You just need to leave."  I discussed with Dr. Earnest Conroy and she has reached out to Dr. Lindi Adie per request of Dr. Wetzel Bjornstad (patient's oncologist at [facility]  Discussed her increased anxiety and paranoia and agree with plan to start seroquel.  I advised RN that this would be preferential to start with rather than ativan as I worry Ativan may have paradoxical effect and increase agitation.  Length of Stay: 8  Current Medications: Scheduled Meds:  . chlorhexidine  15 mL Mouth Rinse BID  . Chlorhexidine Gluconate Cloth  6 each Topical Daily  . famotidine  20 mg Oral Daily  . feeding supplement  1 Container Oral Q24H  . feeding supplement (ENSURE ENLIVE)  237 mL Oral Q24H  . feeding supplement (PRO-STAT SUGAR FREE 64)  30 mL Oral Daily  . gabapentin  100 mg Oral TID  . mouth rinse  15 mL Mouth Rinse q12n4p  . mirtazapine  7.5 mg Oral QHS  . multivitamin with minerals  1 tablet Oral Daily  . QUEtiapine  25 mg Oral Daily  . rivaroxaban  20 mg Oral Q supper  . sodium chloride flush  10-40 mL Intracatheter Q12H    Continuous Infusions: . dextrose 5 % and 0.45 % NaCl with KCl 20 mEq/L 100 mL/hr at 06/28/19 0304    PRN Meds: acetaminophen **OR** acetaminophen, HYDROmorphone (DILAUDID)  injection, hyoscyamine, metoprolol tartrate, ondansetron **OR** ondansetron (ZOFRAN) IV, phenol, promethazine, sodium chloride flush, tiZANidine  Physical Exam  General: Alert, awake, appears anxious Heart: Regular rate and rhythm on monitor.  Resp: No resp distress Further exam deferred per patient request to leave room  Vital Signs: BP (!) 131/91   Pulse (!) 104   Temp 99.3 F (37.4 C)   Resp 17   Ht 5\' 4"  (1.626 m)   Wt 63.5 kg  SpO2 98%   BMI 24.03 kg/m  SpO2: SpO2: 98 % O2 Device: O2 Device: Room Air O2 Flow Rate:    Intake/output summary:   Intake/Output Summary (Last 24 hours) at 06/28/2019 1405 Last data filed at 06/28/2019 0308 Gross per 24 hour  Intake 2015.78 ml  Output 1200 ml  Net 815.78 ml   LBM: Last BM Date: 06/27/19 Baseline Weight: Weight: 63.5 kg Most recent weight: Weight: 63.5 kg       Palliative Assessment/Data:    Flowsheet Rows     Most Recent Value  Intake Tab  Referral Department  Hospitalist  Unit at Time of Referral  ICU  Palliative Care Primary Diagnosis  Cancer  Date Notified  06/26/19  Palliative Care Type  New Palliative care  Reason for referral  Clarify Goals of Care  Date of Admission  06/20/19  Date first seen by Palliative Care  06/26/19  # of days Palliative referral response time  0 Day(s)  # of days IP prior to Palliative referral  6  Clinical Assessment  Palliative Performance Scale Score  40%  Psychosocial & Spiritual Assessment  Palliative Care Outcomes  Patient/Family meeting held?  Yes  Who was at the meeting?  Patient, family      Patient Active Problem List   Diagnosis Date Noted  . Cauda equina compression ([place]) 06/21/2019  . PE (pulmonary thromboembolism) ([place]) 06/21/2019  . Sacral decubitus ulcer, stage II ([place]) 06/21/2019  . Hypertension   . 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 Acute metabolic encephalopathy   . Hyponatremia   . AKI (acute kidney injury) ([place])   . Sepsis ([place])    . Normocytic anemia     Palliative Care Assessment & Plan   Patient Profile: 53 y.o. 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  She has unclear source of fever and has completed 5 days antibiotic therapy.  Blood and urine cultures did not show growth.  ID consulted today.  Palliative consulted for goals of care.  Of note, she does follow with palliative care at [facility] where she receives her chemotherapy.  Last note reviewed there reveals visit focused on symptom management rather than advance care planning.  Assessment: Patient Active Problem List   Diagnosis Date Noted  . Cauda equina compression ([place]) 06/21/2019  . PE (pulmonary thromboembolism) ([place]) 06/21/2019  . Sacral decubitus ulcer, stage II ([place]) 06/21/2019  . Hypertension   . 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 Acute metabolic encephalopathy   . Hyponatremia   . AKI (acute kidney injury) ([place])   . Sepsis ([place])   . Normocytic anemia    Recommendations/Plan:  Partial conde- DNI but plan for CPR in event of cardiac arrest.  Husband remains hopeful for some stabilization and that she will be able to follow up with her oncologist for consideration for additional systemic therapy.  Dr. Earnest Conroy to reach out to Dr. Lindi Adie per request of patient's oncologist at [facility].  Paranoia seems to be increased today.  Agree with plan for Seroquel.  Will continue to follow and progress conversation based on clinical course and as family is emotionally able.  Goals of Care and Additional Recommendations:  Limitations on Scope of Treatment: Full Scope Treatment  Code Status:  Code Status Orders  (From admission, onward)         Start     Ordered    06/27/19 1448  Limited resuscitation (code)  Continuous    Question Answer Comment  In the event of cardiac or respiratory ARREST: Initiate Code Blue, Call Rapid Response Yes   In the event of cardiac or respiratory ARREST: Perform CPR Yes   In the event of cardiac or respiratory ARREST: Perform Intubation/Mechanical Ventilation No   In the event of cardiac or respiratory ARREST: Use NIPPV/BiPAp only if indicated Yes   In the event of cardiac or respiratory ARREST: Administer ACLS medications if indicated Yes   In the event of cardiac or respiratory ARREST: Perform Defibrillation or Cardioversion if indicated Yes      06/27/19 1447        Code Status History    Date Active Date Inactive Code Status Order ID Comments User Context   06/21/2019 0441 06/27/2019 1447 Full Code [id number]  Ivor Costa, MD Inpatient   Advance Care Planning Activity    Advance Directive Documentation     Most Recent Value  Type of Advance Directive  Living will  Pre-existing out of facility DNR order (yellow form or pink MOST form)  -  "MOST" Form in Place?  -       Prognosis:   Unable to determine  Discharge Planning:  To Be Determined  Care plan was discussed with RN, patient, Dr. Earnest Conroy  Thank you for allowing the Palliative Medicine Team to assist in the care of this patient.   Time In: 1220 Time Out: 1250 Total Time 30 Prolonged Time Billed No      Greater than 50%  of this time was spent counseling and coordinating care related to the above assessment and plan.  Micheline Rough, MD  Please contact Palliative Medicine Team phone at [phone] for questions and concerns.

## 2019-06-28 NOTE — Progress Notes (Signed)
ANTICOAGULATION CONSULT NOTE - Follow Up Consult  Pharmacy Consult for heparin Indication: hx pulmonary embolus (holding xarelto)  No Known Allergies  Patient Measurements: Height: 5\' 4"  (162.6 cm) Weight: 140 lb (63.5 kg) IBW/kg (Calculated) : 54.7 Heparin Dosing Weight:   Vital Signs: Temp: 100.2 F (37.9 C) (10/09 0300) Temp Source: Bladder (10/08 1800) BP: 127/90 (10/09 0200) Pulse Rate: 121 (10/09 0300)  Labs: Recent Labs    06/25/19 1319 06/25/19 2042 06/26/19 0504 06/27/19 0157 06/27/19 0856 06/28/19 0025  HGB  --   --  8.4* 8.8*  --   --   HCT  --   --  27.8* 29.1*  --   --   PLT  --   --  384 418*  --   --   APTT 183* 131*  --   --  90*  --   LABPROT  --   --   --   --  14.7  --   INR  --   --   --   --  1.2  --   HEPARINUNFRC 0.52 0.60 0.63 0.21*  --  <0.10*    Estimated Creatinine Clearance: 70.2 mL/min (by C-G formula based on SCr of 0.58 mg/dL).   Medications:  Infusions:  . dextrose 5 % and 0.45 % NaCl with KCl 20 mEq/L 100 mL/hr at 06/28/19 0304  . heparin 900 Units/hr (06/28/19 0304)    Assessment: Patient with low heparin level.  No heparin issues per RN.  Goal of Therapy:  Heparin level 0.3-0.7 units/ml Monitor platelets by anticoagulation protocol: Yes   Plan:  Increase heparin to 1050 units/hr Recheck level at [place], Shea Stakes [place] 06/28/2019,3:32 AM

## 2019-06-28 NOTE — Progress Notes (Signed)
Carboplatin administered at 1655, calculated dose of 532.6 mg within 10% of administered dose of 530 mg. This was verified and dual signed by primary nurse Judeth Horn, Julianne Rice

## 2019-06-28 NOTE — Progress Notes (Signed)
PHARMACY - PHYSICIAN COMMUNICATION CRITICAL VALUE ALERT - BLOOD CULTURE IDENTIFICATION (BCID)  Kaylee Randolph is an 53 y.o. female who presented to [facility] on 06/20/2019 with a chief complaint of abd pain/SBO.  Assessment:  Likely contaminant  Name of physician (or Provider) Contacted: Dr. Earnest Conroy  Current antibiotics: none  Changes to prescribed antibiotics recommended:  none  Results for orders placed or performed during the hospital encounter of 06/20/19  Blood Culture ID Panel (Reflexed) (Collected: 06/26/2019  1:40 PM)  Result Value Ref Range   Enterococcus species NOT DETECTED NOT DETECTED   Listeria monocytogenes NOT DETECTED NOT DETECTED   Staphylococcus species DETECTED (A) NOT DETECTED   Staphylococcus aureus (BCID) NOT DETECTED NOT DETECTED   Methicillin resistance DETECTED (A) NOT DETECTED   Streptococcus species NOT DETECTED NOT DETECTED   Streptococcus agalactiae NOT DETECTED NOT DETECTED   Streptococcus pneumoniae NOT DETECTED NOT DETECTED   Streptococcus pyogenes NOT DETECTED NOT DETECTED   Acinetobacter baumannii NOT DETECTED NOT DETECTED   Enterobacteriaceae species NOT DETECTED NOT DETECTED   Enterobacter cloacae complex NOT DETECTED NOT DETECTED   Escherichia coli NOT DETECTED NOT DETECTED   Klebsiella oxytoca NOT DETECTED NOT DETECTED   Klebsiella pneumoniae NOT DETECTED NOT DETECTED   Proteus species NOT DETECTED NOT DETECTED   Serratia marcescens NOT DETECTED NOT DETECTED   Haemophilus influenzae NOT DETECTED NOT DETECTED   Neisseria meningitidis NOT DETECTED NOT DETECTED   Pseudomonas aeruginosa NOT DETECTED NOT DETECTED   Candida albicans NOT DETECTED NOT DETECTED   Candida glabrata NOT DETECTED NOT DETECTED   Candida krusei NOT DETECTED NOT DETECTED   Candida parapsilosis NOT DETECTED NOT DETECTED   Candida tropicalis NOT DETECTED NOT DETECTED    Angela Adam 06/28/2019  8:09 AM

## 2019-06-29 ENCOUNTER — Inpatient Hospital Stay (HOSPITAL_COMMUNITY): Payer: BC Managed Care – PPO

## 2019-06-29 DIAGNOSIS — Z9221 Personal history of antineoplastic chemotherapy: Secondary | ICD-10-CM

## 2019-06-29 DIAGNOSIS — C7931 Secondary malignant neoplasm of brain: Secondary | ICD-10-CM

## 2019-06-29 DIAGNOSIS — R110 Nausea: Secondary | ICD-10-CM

## 2019-06-29 DIAGNOSIS — R6510 Systemic inflammatory response syndrome (SIRS) of non-infectious origin without acute organ dysfunction: Secondary | ICD-10-CM

## 2019-06-29 LAB — BASIC METABOLIC PANEL
Anion gap: 7 (ref 5–15)
BUN: 16 mg/dL (ref 6–20)
CO2: 16 mmol/L — ABNORMAL LOW (ref 22–32)
Calcium: 8 mg/dL — ABNORMAL LOW (ref 8.9–10.3)
Chloride: 114 mmol/L — ABNORMAL HIGH (ref 98–111)
Creatinine, Ser: 0.7 mg/dL (ref 0.44–1.00)
GFR calc Af Amer: >60 mL/min (ref >60)
GFR calc non Af Amer: >60 mL/min (ref >60)
Glucose, Bld: 132 mg/dL — ABNORMAL HIGH (ref 70–99)
Potassium: 5.4 mmol/L — ABNORMAL HIGH (ref 3.5–5.1)
Sodium: 137 mmol/L (ref 135–145)

## 2019-06-29 LAB — GLUCOSE, CAPILLARY
Glucose-Capillary: 126 mg/dL — ABNORMAL HIGH (ref 70–99)
Glucose-Capillary: 92 mg/dL (ref 70–99)
Glucose-Capillary: 97 mg/dL (ref 70–99)
Glucose-Capillary: 123 mg/dL — ABNORMAL HIGH (ref 70–99)
Glucose-Capillary: 106 mg/dL — ABNORMAL HIGH (ref 70–99)
Glucose-Capillary: 105 mg/dL — ABNORMAL HIGH (ref 70–99)
Glucose-Capillary: 97 mg/dL (ref 70–99)

## 2019-06-29 LAB — CBC
HCT: 28.5 % — ABNORMAL LOW (ref 36.0–46.0)
Hemoglobin: 9 g/dL — ABNORMAL LOW (ref 12.0–15.0)
MCH: 27.5 pg (ref 26.0–34.0)
MCHC: 31.6 g/dL (ref 30.0–36.0)
MCV: 87.2 fL (ref 80.0–100.0)
Platelets: 497 10*3/uL — ABNORMAL HIGH (ref 150–400)
RBC: 3.27 MIL/uL — ABNORMAL LOW (ref 3.87–5.11)
RDW: 19.5 % — ABNORMAL HIGH (ref 11.5–15.5)
WBC: 12.6 10*3/uL — ABNORMAL HIGH (ref 4.0–10.5)
nRBC: 0.2 % (ref 0.0–0.2)

## 2019-06-29 LAB — CULTURE, BLOOD (ROUTINE X 2)

## 2019-06-29 MED ORDER — LORAZEPAM 2 MG/ML IJ SOLN
2.0000 mg | Freq: Once | INTRAMUSCULAR | Status: AC
  Administered 2019-06-29: 14:00:00 2 mg via INTRAVENOUS

## 2019-06-29 MED ORDER — AMLODIPINE BESYLATE 5 MG PO TABS
5.0000 mg | ORAL_TABLET | Freq: Every day | ORAL | Status: DC
  Administered 2019-06-30 – 2019-07-05 (×6): 5 mg via ORAL
  Filled 2019-06-29 (×6): qty 1

## 2019-06-29 MED ORDER — LORAZEPAM 2 MG/ML IJ SOLN
INTRAMUSCULAR | Status: AC
  Administered 2019-06-29: 2 mg via INTRAVENOUS
  Filled 2019-06-29: qty 1

## 2019-06-29 MED ORDER — GADOBUTROL 1 MMOL/ML IV SOLN
7.0000 mL | Freq: Once | INTRAVENOUS | Status: AC | PRN
  Administered 2019-06-29: 7 mL via INTRAVENOUS

## 2019-06-29 NOTE — Progress Notes (Addendum)
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 Patient recently was diagnosed with E. coli UTI which was pansensitive and was treated with ciprofloxacin. Patient  Also reported abdominal pain. Hospital course complicated by persistent fevers with no clear source, AKI, anemia requiring blood transfusion and SBO in the setting of peritoneal carcinomatosis prompting GS consult-managed medically. Initially started on vancomycin and cefepime for presumed sepsis.  Unclear source of infection.Patient completed 5 days of antibiotic therapy.  Blood cultures / Urine culture showed no growth. Now felt to be drug induced fever, being monitored off antibiotics. Mental status has improved.   Subjective: Patient appears comfortable. Somewhat paranoid. Received Chemo yesterday and seen by Oncology for f/u today as well. Says eating better about 30%. T-max of 100.6 in last 24 hours. Objective: Vitals:   06/29/19 0400 06/29/19 0500 06/29/19 1000 06/29/19 1200  BP: (!) 174/110 (!) 158/109 134/90 (!) 165/99  Pulse: (!) 115 (!) 114 (!) 108 (!) 125  Resp: (!) 30 (!) 30 19 (!) 29  Temp: 99.3 F (37.4 C) 99.3 F (37.4 C) 98.6 F (37 C) 99.1 F (37.3 C)  TempSrc:      SpO2: 96% 96% 96% 97%  Weight:      Height:        Intake/Output Summary (Last 24 hours) at 06/29/2019 1319 Last data filed at 06/29/2019 0500 Gross per 24 hour  Intake -  Output 350 ml  Net -350 ml   Filed Weights   06/20/19 2014  Weight: 63.5 kg    Physical Examination:   General exam: Appears weak but no respiratory distress and comfortable  Respiratory system: Clear to auscultation. Respiratory effort normal. Cardiovascular system: S1 & S2 heard. No JVD, murmurs.  1+ pedal edema. Gastrointestinal system: Abdomen is nondistended, soft and nontender. No organomegaly or masses felt. Normal bowel sounds heard. Central nervous system: Alert and oriented. No focal neurological deficits.  Generalized weakness with decreased strength 4/5 in upper extremities, 2/5 in lower extremities right > left Skin: No rashes, lesions or ulcers Psychiatry: Oriented x3, judgement and insight appear normal. Mood & affect flat.    Data Reviewed: I have personally reviewed following labs and imaging studies  CBC: Recent Labs  Lab 06/24/19 0431 06/25/19 0154 06/26/19 0504 06/27/19 0157 06/29/19 0930  WBC 7.4 9.2 13.0* 10.6* 12.6*  HGB 9.5* 8.2* 8.4* 8.8* 9.0*  HCT 30.0* 26.4* 27.8* 29.1* 28.5*  MCV 87.0 87.4 90.8 89.5 87.2  PLT 415* 376 384 418* [id number]*   Basic Metabolic Panel: Recent Labs  Lab 06/23/19 0508 06/24/19 0431 06/24/19 0954 06/25/19 0154 06/28/19 1408 06/29/19 0930  NA 139 141  --  141 137 137  K 3.1* 2.8*  --  3.9 4.5 5.4*  CL 114* 115*  --  115* 113* 114*  CO2 21* 18*  --  18* 17* 16*  GLUCOSE 135* 123*  --  132* 93 132*  BUN 18 17  --  19 14 16   CREATININE 0.59 0.55  --  0.58 0.62 0.70  CALCIUM 8.0* 8.2*  --  7.7* 8.5* 8.0*  MG 1.8  --  1.6*  --  1.7  --   PHOS 2.0*  --   --   --   --   --    GFR: Estimated Creatinine Clearance: 70.2 mL/min (by C-G formula based on SCr of 0.7 mg/dL). Liver Function Tests: Recent Labs  Lab 06/24/19 0431 06/25/19 0154  AST 14* 12*  ALT 18 12  ALKPHOS 84 70  BILITOT 0.4 0.7  PROT 5.2* 4.7*  ALBUMIN 2.0* 1.7*   No results for input(s): LIPASE, AMYLASE in the last 168 hours. No results for input(s): AMMONIA in the last 168 hours. Coagulation Profile: Recent Labs  Lab 06/27/19 0856  INR 1.2   Cardiac  Enzymes: No results for input(s): CKTOTAL, CKMB, CKMBINDEX, TROPONINI in the last 168 hours. BNP (last 3 results) No results for input(s): PROBNP in the last 8760 hours. HbA1C: No results for input(s): HGBA1C in the last 72 hours. CBG: Recent Labs  Lab 06/28/19 1521 06/28/19 1927 06/28/19 2335 06/29/19 0315 06/29/19 0746  GLUCAP 80 92 97 126* 123*   Lipid Profile: No results for input(s): CHOL, HDL, LDLCALC, TRIG, CHOLHDL, LDLDIRECT in the last 72 hours. Thyroid Function Tests: No results for input(s): TSH, T4TOTAL, FREET4, T3FREE, THYROIDAB in the last 72 hours. Anemia Panel: No results for input(s): VITAMINB12, FOLATE, FERRITIN, TIBC, IRON, RETICCTPCT in the last 72 hours. Sepsis Labs: Recent Labs  Lab 06/25/19 1319 06/26/19 0504 06/27/19 0157  PROCALCITON 1.16 <0.10 2.16    Recent Results (from the past 240 hour(s))  Culture, blood (Routine x 2)     Status: None   Collection Time: 06/20/19  8:00 PM   Specimen: BLOOD  Result Value Ref Range Status   Specimen Description   Final    BLOOD CHEST PORTA CATH Performed at [facility], [address] [place], [place] [address]    Special Requests   Final    BOTTLES DRAWN AEROBIC AND ANAEROBIC Blood Culture adequate volume Performed at [facility], [address], [place], [place] [address]    Culture   Final    NO GROWTH 5 DAYS Performed at [facility], [place] [address], [place], [place] [address]    Report Status 06/25/2019 FINAL  Final  Culture, blood (Routine x 2)     Status: None   Collection Time: 06/20/19  8:10 PM   Specimen: BLOOD  Result Value Ref Range Status   Specimen Description   Final    BLOOD RIGHT ANTECUBITAL Performed at [facility], [address] [place], [place] [address]    Special Requests   Final    BOTTLES DRAWN AEROBIC AND ANAEROBIC Blood Culture adequate volume Performed at [facility], [address] [place], [place] [address]     Culture   Final    NO GROWTH 5 DAYS Performed at [facility], [place] [address] [place], [place] [address]    Report Status 06/25/2019 FINAL  Final  SARS Coronavirus 2 [facility] order, Performed in [facility] hospital lab) Nasopharyngeal Nasopharyngeal Swab     Status: None   Collection Time: 06/20/19  8:19 PM   Specimen: Nasopharyngeal Swab  Result Value Ref Range Status   SARS Coronavirus 2 NEGATIVE NEGATIVE Final    Comment: (NOTE) If result is NEGATIVE SARS-CoV-2 target nucleic acids are NOT DETECTED. The SARS-CoV-2 RNA is generally detectable in upper and lower  respiratory specimens during the acute phase of infection. The lowest  concentration  of SARS-CoV-2 viral copies this assay can detect is 250  copies / mL. A negative result does not preclude SARS-CoV-2 infection  and should not be used as the sole basis for treatment or other  patient management decisions.  A negative result may occur with  improper specimen collection / handling, submission of specimen other  than nasopharyngeal swab, presence of viral mutation(s) within the  areas targeted by this assay, and inadequate number of viral copies  (<250 copies / mL). A negative result must be combined with clinical  observations, patient history, and epidemiological information. If result is POSITIVE SARS-CoV-2 target nucleic acids are DETECTED. The SARS-CoV-2 RNA is generally detectable in upper and lower  respiratory specimens dur ing the acute phase of infection.  Positive  results are indicative of active infection with SARS-CoV-2.  Clinical  correlation with patient history and other diagnostic information is  necessary to determine patient infection status.  Positive results do  not rule out bacterial infection or co-infection with other viruses. If result is PRESUMPTIVE POSTIVE SARS-CoV-2 nucleic acids MAY BE PRESENT.   A presumptive positive result was obtained on the submitted specimen  and confirmed on  repeat testing.  While 2019 novel coronavirus  (SARS-CoV-2) nucleic acids may be present in the submitted sample  additional confirmatory testing may be necessary for epidemiological  and / or clinical management purposes  to differentiate between  SARS-CoV-2 and other Sarbecovirus currently known to infect humans.  If clinically indicated additional testing with an alternate test  methodology [phone]) is advised. The SARS-CoV-2 RNA is generally  detectable in upper and lower respiratory sp ecimens during the acute  phase of infection. The expected result is Negative. Fact Sheet for Patients:  StrictlyIdeas.no Fact Sheet for Healthcare Providers: BankingDealers.co.za This test is not yet approved or cleared by the [place] FDA and has been authorized for detection and/or diagnosis of SARS-CoV-2 by FDA under an Emergency Use Authorization (EUA).  This EUA will remain in effect (meaning this test can be used) for the duration of the COVID-19 declaration under Section 564(b)(1) of the Act, 21 U.S.C. section 360bbb-3(b)(1), unless the authorization is terminated or revoked sooner. Performed at [facility], [address] [place], [place] [address]   Urine culture     Status: None   Collection Time: 06/20/19  8:32 PM   Specimen: In/Out Cath Urine  Result Value Ref Range Status   Specimen Description   Final    IN/OUT CATH URINE Performed at [facility], [address] [place], [place] [address]    Special Requests   Final    NONE Performed at [facility], [address] [place], [place] [address]    Culture   Final    NO GROWTH Performed at [facility], [place] [address], [place], [place] [address]    Report Status 06/21/2019 FINAL  Final  MRSA PCR Screening     Status: None   Collection Time: 06/21/19  3:53 AM   Specimen: Nasal Mucosa; Nasopharyngeal  Result Value Ref Range Status    MRSA by PCR NEGATIVE NEGATIVE Final    Comment:        The GeneXpert MRSA Assay (FDA approved for NASAL specimens only), is one component of a comprehensive MRSA colonization surveillance program. It is not intended to diagnose MRSA infection nor to guide or monitor treatment for MRSA infections. Performed at [facility], [address]  [place], [place] [address]   Culture, blood (Routine X 2) w Reflex to ID Panel     Status: None (Preliminary result)   Collection Time: 06/26/19  1:40 PM   Specimen: BLOOD RIGHT HAND  Result Value Ref Range Status   Specimen Description   Final    BLOOD RIGHT HAND Performed at [facility] [address], [place], [place] [address]    Special Requests   Final    BOTTLES DRAWN AEROBIC ONLY Blood Culture results may not be optimal due to an inadequate volume of blood received in culture bottles Performed at [facility] [address], [place], [place] [address]    Culture   Final    NO GROWTH 2 DAYS Performed at [facility] [address], [place], [place] [address]    Report Status PENDING  Incomplete  Culture, blood (Routine X 2) w Reflex to ID Panel     Status: Abnormal   Collection Time: 06/26/19  1:40 PM   Specimen: BLOOD  Result Value Ref Range Status   Specimen Description   Final    BLOOD LEFT ANTECUBITAL Performed at [facility] [address], [place], [place] [address]    Special Requests   Final    BOTTLES DRAWN AEROBIC ONLY Blood Culture results may not be optimal due to an inadequate volume of blood received in culture bottles Performed at [facility] [address] [place], [place] [address]    Culture  Setup Time   Final    AEROBIC BOTTLE ONLY GRAM POSITIVE COCCI CRITICAL RESULT CALLED TO, READ BACK BY AND VERIFIED WITH: PHARMD [id number] AT 31 AM BY CM    Culture (A)  Final    STAPHYLOCOCCUS SPECIES (COAGULASE  NEGATIVE) THE SIGNIFICANCE OF ISOLATING THIS ORGANISM FROM A SINGLE SET OF BLOOD CULTURES WHEN MULTIPLE SETS ARE DRAWN IS UNCERTAIN. PLEASE NOTIFY THE MICROBIOLOGY DEPARTMENT WITHIN ONE WEEK IF SPECIATION AND SENSITIVITIES ARE REQUIRED. Performed at [facility], [place] [address] [place], [place] [address]    Report Status 06/29/2019 FINAL  Final  Blood Culture ID Panel (Reflexed)     Status: Abnormal   Collection Time: 06/26/19  1:40 PM  Result Value Ref Range Status   Enterococcus species NOT DETECTED NOT DETECTED Final   Listeria monocytogenes NOT DETECTED NOT DETECTED Final   Staphylococcus species DETECTED (A) NOT DETECTED Final    Comment: Methicillin (oxacillin) resistant coagulase negative staphylococcus. Possible blood culture contaminant (unless isolated from more than one blood culture draw or clinical case suggests pathogenicity). No antibiotic treatment is indicated for blood  culture contaminants. CRITICAL RESULT CALLED TO, READ BACK BY AND VERIFIED WITH: PHARMD [id number] AT 25 AM BY CM    Staphylococcus aureus (BCID) NOT DETECTED NOT DETECTED Final   Methicillin resistance DETECTED (A) NOT DETECTED Final    Comment: CRITICAL RESULT CALLED TO, READ BACK BY AND VERIFIED WITH: PHARMD T GREEN [id number] AT 61 BY CM    Streptococcus species NOT DETECTED NOT DETECTED Final   Streptococcus agalactiae NOT DETECTED NOT DETECTED Final   Streptococcus pneumoniae NOT DETECTED NOT DETECTED Final   Streptococcus pyogenes NOT DETECTED NOT DETECTED Final   Acinetobacter baumannii NOT DETECTED NOT DETECTED Final   Enterobacteriaceae species NOT DETECTED NOT DETECTED Final   Enterobacter cloacae complex NOT DETECTED NOT DETECTED Final   Escherichia coli NOT DETECTED NOT DETECTED Final   Klebsiella oxytoca NOT DETECTED NOT DETECTED Final   Klebsiella  pneumoniae NOT DETECTED NOT DETECTED Final   Proteus species NOT DETECTED NOT DETECTED Final   Serratia marcescens NOT DETECTED NOT DETECTED  Final   Haemophilus influenzae NOT DETECTED NOT DETECTED Final   Neisseria meningitidis NOT DETECTED NOT DETECTED Final   Pseudomonas aeruginosa NOT DETECTED NOT DETECTED Final   Candida albicans NOT DETECTED NOT DETECTED Final   Candida glabrata NOT DETECTED NOT DETECTED Final   Candida krusei NOT DETECTED NOT DETECTED Final   Candida parapsilosis NOT DETECTED NOT DETECTED Final   Candida tropicalis NOT DETECTED NOT DETECTED Final    Comment: Performed at [facility], [place] [address] [place], [place] [address]  Acid Fast Smear (AFB)     Status: None   Collection Time: 06/27/19 11:45 AM   Specimen: Abdomen; Peritoneal Fluid  Result Value Ref Range Status   AFB Specimen Processing Comment  Final    Comment: Tissue Grinding and Digestion/Decontamination   Acid Fast Smear Negative  Final    Comment: (NOTE) Performed At: [facility] [id number] [place], [place] [id number] Rush Farmer MD [id number]    Source (AFB) ABDOMEN  Final    Comment: Performed at [facility], [place] [address] [place], [place] [address]  Culture, body fluid-bottle     Status: None (Preliminary result)   Collection Time: 06/27/19 11:45 AM   Specimen: Abdomen  Result Value Ref Range Status   Specimen Description ABDOMEN  Final   Special Requests NONE  Final   Culture   Final    NO GROWTH < 24 HOURS Performed at [facility], [place] [address] [place], [place] [address]    Report Status PENDING  Incomplete  Gram stain     Status: None   Collection Time: 06/27/19 11:45 AM   Specimen: Abdomen  Result Value Ref Range Status   Specimen Description ABDOMEN  Final   Special Requests NONE  Final   Gram Stain   Final    RARE WBC PRESENT,BOTH PMN AND MONONUCLEAR NO ORGANISMS SEEN Performed at [facility], [address] [place], [place] [address]    Report Status 06/27/2019 FINAL  Final  Anaerobic culture     Status: None (Preliminary result)   Collection  Time: 06/27/19  4:00 PM   Specimen: PATH Cytology CSF; Cerebrospinal Fluid  Result Value Ref Range Status   Specimen Description   Final    CSF Performed at [facility] [address], [place], [place] [address]    Special Requests   Final    NONE Performed at [facility], [place] [address], [place], [place] [address]    Culture   Final    NO ANAEROBES ISOLATED; CULTURE IN PROGRESS FOR 5 DAYS   Report Status PENDING  Incomplete  CSF culture     Status: None (Preliminary result)   Collection Time: 06/27/19  4:00 PM   Specimen: PATH Cytology CSF; Cerebrospinal Fluid  Result Value Ref Range Status   Specimen Description   Final    CSF Performed at [facility], [place] [address] [place], [place] [address]    Special Requests   Final    NONE Performed at [facility], [place] [address] [place],  [address]    Gram Stain   Final    WBC PRESENT,BOTH PMN AND MONONUCLEAR NO ORGANISMS SEEN CYTOSPIN SMEAR    Culture   Final    NO GROWTH 2 DAYS Performed at  [facility], [place] [address], [place], [place] [address]    Report Status PENDING  Incomplete  Culture, fungus without smear     Status: None (Preliminary result)   Collection Time: 06/27/19  4:00 PM   Specimen: PATH Cytology CSF; Cerebrospinal Fluid  Result Value Ref Range Status   Specimen Description   Final    CSF Performed at [facility] [address] [place], [place] [address]    Special Requests   Final    NONE Performed at [facility], [place] [address] [place], [place] [address]    Culture   Final    NO FUNGUS ISOLATED AFTER 2 DAYS Performed at [facility], [place] [address], [place],  [address]    Report Status PENDING  Incomplete      Radiology Studies: Dg Fluoro Guided Needle Plc Aspiration/injection Loc  Result Date: 06/27/2019 CLINICAL DATA:  History of breast cancer,  fevers, altered mental status. EXAM: DIAGNOSTIC LUMBAR PUNCTURE UNDER FLUOROSCOPIC GUIDANCE FLUOROSCOPY TIME:  Fluoroscopy Time:  30 seconds Radiation Exposure Index (if provided by the fluoroscopic device): 3.7 mGy Number of Acquired Spot Images: 0 PROCEDURE: Informed consent was obtained from the patient prior to the procedure, including potential complications of headache, allergy, and pain. With the patient prone, the lower back was prepped with Betadine. 1% Lidocaine was used for local anesthesia. Lumbar puncture was performed at the L2-L3 level using a 22 gauge needle with return of clear CSF with normal-appearing opening pressure. Ten ml of CSF were obtained for laboratory studies. The patient tolerated the procedure well and there were no apparent complications. IMPRESSION: 1. Technically successful lumbar puncture, 10 cc obtained and sent for requested testing. Electronically Signed   By: Zetta Bills M.D.   On: 06/27/2019 17:30        Scheduled Meds: . amLODipine  5 mg Oral Daily  . chlorhexidine  15 mL Mouth Rinse BID  . Chlorhexidine Gluconate Cloth  6 each Topical Daily  . famotidine  20 mg Oral Daily  . feeding supplement  1 Container Oral Q24H  . feeding supplement (ENSURE ENLIVE)  237 mL Oral Q24H  . feeding supplement (PRO-STAT SUGAR FREE 64)  30 mL Oral Daily  . gabapentin  100 mg Oral TID  . mouth rinse  15 mL Mouth Rinse q12n4p  . mirtazapine  7.5 mg Oral QHS  . multivitamin with minerals  1 tablet Oral Daily  . QUEtiapine  25 mg Oral Daily  . rivaroxaban  20 mg Oral Q supper  . sodium chloride flush  10-40 mL Intracatheter Q12H   Continuous Infusions: . dextrose 5 % and 0.45 % NaCl with KCl 20 mEq/L 100 mL/hr at 06/29/19 1100    Assessment & Plan:    1.  Small bowel obstruction: CT abdomen on admission revealed SBO with metastatic disease/peritoneal carcinomatosis.  Seen by general surgery and managed conservatively.  She did have bowel movements during the  hospital course, tolerating soft diet and general surgery has signed off. Oral intake limited due to poor appetite rather than GI intolerance.    2.  Fever with systemic inflammatory response syndrome POA: Procalcitonin at 3.2 on presentation-> 1.16--> now <0.10.  Urine cultures and blood cultures so far negative.  Patient started on vancomycin and cefepime empirically x5 days which are now discontinued in concern for possible drug-induced fever.   Procalcitonin levels have come down, no white count.  Seen by ID and underwent diagnostic/therapeutic  paracentesis as well as lumbar puncture for body fluid/CSF cultures.  Preliminary body fluid labs do not indicate infection.  Fever could be drug-induced versus inflammatory response to recent chemotherapy/tumor itself.  Sepsis ruled out.  Continue to monitor off antibiotics per ID  3.  Metabolic encephalopathy/Delirium with paranoia:  in the setting of problem #1 and 2.  CT head negative for any acute findings.  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  CSF sent for cytology on 10/7. No evidence of CNS infection. Given paranoia , will try seroquel low dose (qtc okay) and change tizanidine to PRN use. D/C ativan. Can continue remeron/neurontin for now.   4.  Breast cancer with diffuse metastasis-peritoneal carcinomatosis/spinal cord/adrenal mets: Patient was following [facility] and was undergoing chemotherapy-last session a week prior to admission.  Patient over the last couple of days expressed wishes to avoid aggressive interventions.  She is not in favor for feeding tubes/mechanical ventilation. CODE STATUS now changed to DNI after d/w family. Seen by Oncology who feels her mental status change likely related to brain mets. MRI head ordered. Husband d/w Oncology and patient received one additional dose of palliative chemo upon his insistence in the hope to prolong life even if it is for couple of weeks where she could  spend time at home with family.  Palliative care following and also assisting in discussions regarding care goals.  5.  History of pulmonary embolism: On Xarelto at home--> been on heparin given SBO/ IR procedures, transitioned back to Xarelto.   6.  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  Patient has bilateral leg weakness right more than left.  Foley catheter has been placed.  7.  Acute on chronic anemia: S/p blood transfusion.  Hemoglobin improved to 8.2  8.  Ascitis : in the setting of peritoneal carcinomatosis. S/p paracentesis on 10/8 -drained 2 lits of ascitic fluid . No signs of infection. ID does not recommend any antibiotics.   9.  FEN-severe PCM: Patient continues to have poor appetite and now on soft diet.  She states she does not want feeding tubes.  Her albumin level is less than 2 and likely contributing to her lower extremity edema/moderate ascitis.  10. HTN: BP evelated to systolic [id number] now. Takes Losartan / diuretics at home. Will avoid ACEI due to mild hyperkalemia on labs today. Avoid diuretics due to poor oral intake. Add Norvasc.  11. 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 d/c Temp-Foley as planned for MRI head. Bladder scan and reinsert foley if still retaining.  12. Sacral decubitus stage II: Local wound care   DVT prophylaxis: On heparin IV Code Status: DNI per patient's wish Family / Patient Communication: Discussed with patient. Also discussed with palliative care MD Disposition Plan: Home with home health services per case management note. Unfortunately Ms. Foskett has very poor prognosis and is getting tired of multiple interventions. Her oral intake remains poor and she doesn't want feeding tubes. She remains paraplegic and bedbound. Received palliative chemo on 10/9 x1 session. Oncology/  Palliative care team following along-hopefully care goals and disposition plan will be clearer by Monday and we can get her home with home  hospice.   LOS: 9 days    Time spent: [place]    Guilford Shi, MD [facility] Pager [phone]  If 7PM-7AM, please contact night-coverage [email or web address] Password [email or web address] 06/29/2019, 1:19 PM

## 2019-06-29 NOTE — Progress Notes (Signed)
Ms. Kaylee Randolph is very nice.  She is charming.  She is able to talk to me a little bit this morning.  She had her chemotherapy yesterday.  She got a single agent carboplatinum.  She had been getting this I think at [facility].  She has had no problems with nausea.  I would like to make sure that she is on some type of nausea regimen.  I know that she has several nausea medicines ordered.  She had Aloxi and Emend yesterday.  Apparently, she had an episode of delirium yesterday.  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  This morning she seems to be somewhat a alert.  She is not sure what day it is.  She seems to know why she is in the hospital.  There is no labs back as of yet.  She apparently had a low-grade temperature yesterday.  It was 100.9.  This could certainly be from her underlying malignancy, particularly if it is in the brain.  This morning, her blood pressure was 158/109.  Temperature 99.3.  Pulse is 114.  Oral exam shows no obvious mucositis.  There may be a little bit of thrush on the tongue.  There is no adenopathy.  Lungs are clear bilaterally.  Cardiac exam tachycardic but regular.  Abdomen is soft.  Bowel sounds are decreased.  There is no distention.  There is no guarding or rebound tenderness.  Extremities shows no clubbing, cyanosis or edema.  Neurological exam does show some generalized weakness.  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  She has CNS disease.  She received carboplatinum yesterday.  We need to see how her labs look.  I will make sure she has lab work done for Sunday.  I probably would get her on a schedule of anti-emetic agents so that she does not have problems with nausea.  I just feel bad for her.  She seems very nice.  I know she has a very tough problem.  Lattie Haw, MD  [place] 4:4

## 2019-06-30 DIAGNOSIS — R410 Disorientation, unspecified: Secondary | ICD-10-CM

## 2019-06-30 DIAGNOSIS — R000 Tachycardia, unspecified: Secondary | ICD-10-CM

## 2019-06-30 DIAGNOSIS — R5383 Other fatigue: Secondary | ICD-10-CM

## 2019-06-30 LAB — COMPREHENSIVE METABOLIC PANEL
ALT: 12 U/L (ref 0–44)
AST: 14 U/L — ABNORMAL LOW (ref 15–41)
Albumin: 1.8 g/dL — ABNORMAL LOW (ref 3.5–5.0)
Alkaline Phosphatase: 84 U/L (ref 38–126)
Anion gap: 8 (ref 5–15)
BUN: 19 mg/dL (ref 6–20)
CO2: 17 mmol/L — ABNORMAL LOW (ref 22–32)
Calcium: 8.6 mg/dL — ABNORMAL LOW (ref 8.9–10.3)
Chloride: 113 mmol/L — ABNORMAL HIGH (ref 98–111)
Creatinine, Ser: 0.7 mg/dL (ref 0.44–1.00)
GFR calc Af Amer: >60 mL/min (ref >60)
GFR calc non Af Amer: >60 mL/min (ref >60)
Glucose, Bld: 93 mg/dL (ref 70–99)
Potassium: 4.8 mmol/L (ref 3.5–5.1)
Sodium: 138 mmol/L (ref 135–145)
Total Bilirubin: 0.6 mg/dL (ref 0.3–1.2)
Total Protein: 5.1 g/dL — ABNORMAL LOW (ref 6.5–8.1)

## 2019-06-30 LAB — CBC WITH DIFFERENTIAL/PLATELET
Abs Immature Granulocytes: 0.22 10*3/uL — ABNORMAL HIGH (ref 0.00–0.07)
Basophils Absolute: 0 10*3/uL (ref 0.0–0.1)
Basophils Relative: 0 %
Eosinophils Absolute: 0 10*3/uL (ref 0.0–0.5)
Eosinophils Relative: 0 %
HCT: 27.5 % — ABNORMAL LOW (ref 36.0–46.0)
Hemoglobin: 8.5 g/dL — ABNORMAL LOW (ref 12.0–15.0)
Immature Granulocytes: 2 %
Lymphocytes Relative: 3 %
Lymphs Abs: 0.3 10*3/uL — ABNORMAL LOW (ref 0.7–4.0)
MCH: 27 pg (ref 26.0–34.0)
MCHC: 30.9 g/dL (ref 30.0–36.0)
MCV: 87.3 fL (ref 80.0–100.0)
Monocytes Absolute: 1.1 10*3/uL — ABNORMAL HIGH (ref 0.1–1.0)
Monocytes Relative: 10 %
Neutro Abs: 9.5 10*3/uL — ABNORMAL HIGH (ref 1.7–7.7)
Neutrophils Relative %: 85 %
Platelets: 538 10*3/uL — ABNORMAL HIGH (ref 150–400)
RBC: 3.15 MIL/uL — ABNORMAL LOW (ref 3.87–5.11)
RDW: 19.8 % — ABNORMAL HIGH (ref 11.5–15.5)
WBC: 11.2 10*3/uL — ABNORMAL HIGH (ref 4.0–10.5)
nRBC: 0.2 % (ref 0.0–0.2)

## 2019-06-30 LAB — GLUCOSE, CAPILLARY
Glucose-Capillary: 119 mg/dL — ABNORMAL HIGH (ref 70–99)
Glucose-Capillary: 135 mg/dL — ABNORMAL HIGH (ref 70–99)
Glucose-Capillary: 83 mg/dL (ref 70–99)
Glucose-Capillary: 82 mg/dL (ref 70–99)
Glucose-Capillary: 82 mg/dL (ref 70–99)
Glucose-Capillary: 94 mg/dL (ref 70–99)

## 2019-06-30 LAB — CSF CULTURE W GRAM STAIN: Culture: NO GROWTH

## 2019-06-30 MED ORDER — SODIUM CHLORIDE 0.9 % IV SOLN
INTRAVENOUS | Status: DC
  Administered 2019-06-30 – 2019-07-01 (×2): via INTRAVENOUS

## 2019-06-30 NOTE — Progress Notes (Signed)
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  Initially diagnosed with E. coli UTI treated with Cipro but due to persistent fevers without any other clear source was started on vancomycin and cefepime for presumed sepsis.  By infectious disease.   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  Drug-related versus malignancy. -Status post 5 days vancomycin/cefepime.  Suspect drug-related fevers - Lumbar puncture- no infection per preliminary results -Seen by infectious disease.  Now signed off  Sinus tachycardia -Suspect intravascular volume depletion.  Normal saline 75 cc X1 day.  If necessary will give intermittent bolus.  Small bowel obstruction secondary to peritoneal carcinomatosis/metastatic disease -Conservative management.  Seen by general surgeon  Intermittent delirium with paranoia -CT head negative.  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  Breast cancer with diffuse metastasis -Thrombocytopenia oncology status post recent chemotherapy.  Seen by palliative care-opting for less aggressive measures. -Received a  dose of carboplatin  Patient is DNR -MRI head- negative - Cardiology following.  History of pulmonary embolism - On Xarelto.  Cauda equina syndrome With neurogenic bladder -Secondary to spinal metastasis.  Foley replaced  Acute on chronic anemia -Monitor hemoglobin.  Malignant ascites - Status post paracentesis 10/8.  Stage II decubitus ulcer  Very poor prognosis secondary to advanced metastases.  DVT prophylaxis: Xarelto Code Status: DNI but okay with CPR. Family Communication: None at bedside Disposition Plan: Today due to tachycardia.  Consultants:   Ortho  Infectious disease  Palliative care   Subjective: Poor oral intake.  Sinus tachycardia.  Generalized weakness but no other complaints.  Review of Systems Otherwise negative except as per HPI, including: General: Denies fever, chills, night sweats or unintended weight loss. Resp: Denies cough, wheezing, shortness of breath. Cardiac: Denies chest pain, palpitations, orthopnea, paroxysmal nocturnal dyspnea. GI: Denies abdominal pain, nausea, vomiting, diarrhea or constipation GU: Denies dysuria, frequency, hesitancy or incontinence MS: Denies muscle aches, joint pain or swelling Neuro: Denies headache, neurologic deficits (focal weakness, numbness, tingling), abnormal gait Psych: Denies anxiety, depression, SI/HI/AVH Skin: Denies new rashes or lesions ID: Denies sick contacts, exotic exposures, travel  Objective: Vitals:   06/30/19 0356 06/30/19 0400 06/30/19 0500 06/30/19 0600  BP:  137/82  (!) 154/106  Pulse:  (!) 116 (!) 118   Resp:  15 17 (!) 22  Temp: 97.9 F (36.6 C)     TempSrc: Axillary     SpO2:  92% 91%   Weight:      Height:       No intake or output data in the 24 hours ending 06/30/19 0734 Filed Weights   06/20/19 2014  Weight: 63.5 kg    Examination:  General exam: Appears calm and comfortable  Respiratory  system: Clear to auscultation. Respiratory effort  normal. Cardiovascular system: Sinus tachycardia, S1 & S2 heard, RRR. No JVD, murmurs, rubs, gallops or clicks. No pedal edema. Gastrointestinal system: Abdomen is nondistended, soft and nontender. No organomegaly or masses felt.  Diminished bowel sounds heard. Central nervous system: Alert and oriented. No focal neurological deficits. Extremities: Symmetric 4 x 5 power. Skin: No rashes, lesions or ulcers Psychiatry: Judgement and insight appear normal. Mood & affect appropriate.     Data Reviewed:   CBC: Recent Labs  Lab 06/25/19 0154 06/26/19 0504 06/27/19 0157 06/29/19 0930 06/30/19 0545  WBC 9.2 13.0* 10.6* 12.6* 11.2*  NEUTROABS  --   --   --   --  9.5*  HGB 8.2* 8.4* 8.8* 9.0* 8.5*  HCT 26.4* 27.8* 29.1* 28.5* 27.5*  MCV 87.4 90.8 89.5 87.2 87.3  PLT 376 384 418* 497* 538*   Basic Metabolic Panel: Recent Labs  Lab 06/24/19 0431 06/24/19 0954 06/25/19 0154 06/28/19 1408 06/29/19 0930 06/30/19 0545  NA 141  --  141 137 137 138  K 2.8*  --  3.9 4.5 5.4* 4.8  CL 115*  --  115* 113* 114* 113*  CO2 18*  --  18* 17* 16* 17*  GLUCOSE 123*  --  132* 93 132* 93  BUN 17  --  19 14 16 19   CREATININE 0.55  --  0.58 0.62 0.70 0.70  CALCIUM 8.2*  --  7.7* 8.5* 8.0* 8.6*  MG  --  1.6*  --  1.7  --   --    GFR: Estimated Creatinine Clearance: 70.2 mL/min (by C-G formula based on SCr of 0.7 mg/dL). Liver Function Tests: Recent Labs  Lab 06/24/19 0431 06/25/19 0154 06/30/19 0545  AST 14* 12* 14*  ALT 18 12 12   ALKPHOS 84 70 84  BILITOT 0.4 0.7 0.6  PROT 5.2* 4.7* 5.1*  ALBUMIN 2.0* 1.7* 1.8*   No results for input(s): LIPASE, AMYLASE in the last 168 hours. No results for input(s): AMMONIA in the last 168 hours. Coagulation Profile: Recent Labs  Lab 06/27/19 0856  INR 1.2   Cardiac Enzymes: No results for input(s): CKTOTAL, CKMB, CKMBINDEX, TROPONINI in the last 168 hours. BNP (last 3 results) No results for input(s): PROBNP in the last 8760  hours. HbA1C: No results for input(s): HGBA1C in the last 72 hours. CBG: Recent Labs  Lab 06/29/19 0746 06/29/19 1615 06/29/19 1926 06/29/19 2322 06/30/19 0336  GLUCAP 123* 106* 105* 97 83   Lipid Profile: No results for input(s): CHOL, HDL, LDLCALC, TRIG, CHOLHDL, LDLDIRECT in the last 72 hours. Thyroid Function Tests: No results for input(s): TSH, T4TOTAL, FREET4, T3FREE, THYROIDAB in the last 72 hours. Anemia Panel: No results for input(s): VITAMINB12, FOLATE, FERRITIN, TIBC, IRON, RETICCTPCT in the last 72 hours. Sepsis Labs: Recent Labs  Lab 06/25/19 1319 06/26/19 0504 06/27/19 0157  PROCALCITON 1.16 <0.10 2.16    Recent Results (from the past 240 hour(s))  Culture, blood (Routine x 2)     Status: None   Collection Time: 06/20/19  8:00 PM   Specimen: BLOOD  Result Value Ref Range Status   Specimen Description   Final    BLOOD CHEST PORTA CATH Performed at [facility], [address] [place], [place] [address]    Special Requests   Final    BOTTLES DRAWN AEROBIC AND ANAEROBIC Blood Culture adequate volume Performed at [facility], [address] [place], [place] [address]  Culture   Final    NO GROWTH 5 DAYS Performed at [facility], [address] [place], [place] [address]    Report Status 06/25/2019 FINAL  Final  Culture, blood (Routine x 2)     Status: None   Collection Time: 06/20/19  8:10 PM   Specimen: BLOOD  Result Value Ref Range Status   Specimen Description   Final    BLOOD RIGHT ANTECUBITAL Performed at [facility], [address] [place], [place] [address]    Special Requests   Final    BOTTLES DRAWN AEROBIC AND ANAEROBIC Blood Culture adequate volume Performed at [facility], [address] [place], [place] [address]    Culture   Final    NO GROWTH 5 DAYS Performed at [facility], [address] [place], [place] [address]    Report Status 06/25/2019 FINAL  Final  SARS  Coronavirus 2 [facility] order, Performed in [facility] hospital lab) Nasopharyngeal Nasopharyngeal Swab     Status: None   Collection Time: 06/20/19  8:19 PM   Specimen: Nasopharyngeal Swab  Result Value Ref Range Status   SARS Coronavirus 2 NEGATIVE NEGATIVE Final    Comment: (NOTE) If result is NEGATIVE SARS-CoV-2 target nucleic acids are NOT DETECTED. The SARS-CoV-2 RNA is generally detectable in upper and lower  respiratory specimens during the acute phase of infection. The lowest  concentration of SARS-CoV-2 viral copies this assay can detect is 250  copies / mL. A negative result does not preclude SARS-CoV-2 infection  and should not be used as the sole basis for treatment or other  patient management decisions.  A negative result may occur with  improper specimen collection / handling, submission of specimen other  than nasopharyngeal swab, presence of viral mutation(s) within the  areas targeted by this assay, and inadequate number of viral copies  (<250 copies / mL). A negative result must be combined with clinical  observations, patient history, and epidemiological information. If result is POSITIVE SARS-CoV-2 target nucleic acids are DETECTED. The SARS-CoV-2 RNA is generally detectable in upper and lower  respiratory specimens dur ing the acute phase of infection.  Positive  results are indicative of active infection with SARS-CoV-2.  Clinical  correlation with patient history and other diagnostic information is  necessary to determine patient infection status.  Positive results do  not rule out bacterial infection or co-infection with other viruses. If result is PRESUMPTIVE POSTIVE SARS-CoV-2 nucleic acids MAY BE PRESENT.   A presumptive positive result was obtained on the submitted specimen  and confirmed on repeat testing.  While 2019 novel coronavirus  (SARS-CoV-2) nucleic acids may be present in the submitted sample  additional confirmatory testing may be necessary  for epidemiological  and / or clinical management purposes  to differentiate between  SARS-CoV-2 and other Sarbecovirus currently known to infect humans.  If clinically indicated additional testing with an alternate test  methodology [phone]) is advised. The SARS-CoV-2 RNA is generally  detectable in upper and lower respiratory sp ecimens during the acute  phase of infection. The expected result is Negative. Fact Sheet for Patients:  BoilerBrush.com.cy Fact Sheet for Healthcare Providers: https://pope.com/ This test is not yet approved or cleared by the [place] FDA and has been authorized for detection and/or diagnosis of SARS-CoV-2 by FDA under an Emergency Use Authorization (EUA).  This EUA will remain in effect (meaning this test can be used) for the duration of the  COVID-19 declaration under Section 564(b)(1) of the Act, 21 U.S.C. section 360bbb-3(b)(1), unless the authorization is terminated or revoked sooner. Performed at [facility], [address] [place], [place] [address]   Urine culture     Status: None   Collection Time: 06/20/19  8:32 PM   Specimen: In/Out Cath Urine  Result Value Ref Range Status   Specimen Description   Final    IN/OUT CATH URINE Performed at [facility], [address] [place], [place] [address]    Special Requests   Final    NONE Performed at [facility], [address] [place], [place] [address]    Culture   Final    NO GROWTH Performed at [facility], [address], [place], [place] [address]    Report Status 06/21/2019 FINAL  Final  MRSA PCR Screening     Status: None   Collection Time: 06/21/19  3:53 AM   Specimen: Nasal Mucosa; Nasopharyngeal  Result Value Ref Range Status   MRSA by PCR NEGATIVE NEGATIVE Final    Comment:        The GeneXpert MRSA Assay (FDA approved for NASAL specimens only), is one component of a comprehensive  MRSA colonization surveillance program. It is not intended to diagnose MRSA infection nor to guide or monitor treatment for MRSA infections. Performed at [facility], [address] [place], [place] [address]   Culture, blood (Routine X 2) w Reflex to ID Panel     Status: None (Preliminary result)   Collection Time: 06/26/19  1:40 PM   Specimen: BLOOD RIGHT HAND  Result Value Ref Range Status   Specimen Description   Final    BLOOD RIGHT HAND Performed at [facility], [address], [place], [place] [address]    Special Requests   Final    BOTTLES DRAWN AEROBIC ONLY Blood Culture results may not be optimal due to an inadequate volume of blood received in culture bottles Performed at [facility], [address], [place], [place] [address]    Culture   Final    NO GROWTH 3 DAYS Performed at [facility], [address] [place], [place] [address]    Report Status PENDING  Incomplete  Culture, blood (Routine X 2) w Reflex to ID Panel     Status: Abnormal   Collection Time: 06/26/19  1:40 PM   Specimen: BLOOD  Result Value Ref Range Status   Specimen Description   Final    BLOOD LEFT ANTECUBITAL Performed at [facility], [address] [place], [place] [address]    Special Requests   Final    BOTTLES DRAWN AEROBIC ONLY Blood Culture results may not be optimal due to an inadequate volume of blood received in culture bottles Performed at [facility], [address], [place], [place] [address]    Culture  Setup Time   Final    AEROBIC BOTTLE ONLY GRAM POSITIVE COCCI CRITICAL RESULT CALLED TO, READ BACK BY AND VERIFIED WITH: PHARMD [id number] AT 701 AM BY CM    Culture (A)  Final    STAPHYLOCOCCUS SPECIES (COAGULASE NEGATIVE) THE SIGNIFICANCE OF ISOLATING THIS ORGANISM FROM A SINGLE SET OF BLOOD CULTURES WHEN MULTIPLE SETS ARE DRAWN IS UNCERTAIN. PLEASE NOTIFY THE MICROBIOLOGY  DEPARTMENT WITHIN ONE WEEK IF SPECIATION AND SENSITIVITIES ARE REQUIRED. Performed at [facility], [address],  [place], [place] [address]    Report Status 06/29/2019 FINAL  Final  Blood Culture ID Panel (Reflexed)     Status: Abnormal   Collection Time: 06/26/19  1:40 PM  Result Value Ref Range Status   Enterococcus species NOT DETECTED NOT DETECTED Final   Listeria monocytogenes NOT DETECTED NOT DETECTED Final   Staphylococcus species DETECTED (A) NOT DETECTED Final    Comment: Methicillin (oxacillin) resistant coagulase negative staphylococcus. Possible blood culture contaminant (unless isolated from more than one blood culture draw or clinical case suggests pathogenicity). No antibiotic treatment is indicated for blood  culture contaminants. CRITICAL RESULT CALLED TO, READ BACK BY AND VERIFIED WITH: PHARMD [id number] AT 703 AM BY CM    Staphylococcus aureus (BCID) NOT DETECTED NOT DETECTED Final   Methicillin resistance DETECTED (A) NOT DETECTED Final    Comment: CRITICAL RESULT CALLED TO, READ BACK BY AND VERIFIED WITH: PHARMD T GREEN [id number] AT 703 BY CM    Streptococcus species NOT DETECTED NOT DETECTED Final   Streptococcus agalactiae NOT DETECTED NOT DETECTED Final   Streptococcus pneumoniae NOT DETECTED NOT DETECTED Final   Streptococcus pyogenes NOT DETECTED NOT DETECTED Final   Acinetobacter baumannii NOT DETECTED NOT DETECTED Final   Enterobacteriaceae species NOT DETECTED NOT DETECTED Final   Enterobacter cloacae complex NOT DETECTED NOT DETECTED Final   Escherichia coli NOT DETECTED NOT DETECTED Final   Klebsiella oxytoca NOT DETECTED NOT DETECTED Final   Klebsiella pneumoniae NOT DETECTED NOT DETECTED Final   Proteus species NOT DETECTED NOT DETECTED Final   Serratia marcescens NOT DETECTED NOT DETECTED Final   Haemophilus influenzae NOT DETECTED NOT DETECTED Final   Neisseria meningitidis NOT DETECTED NOT DETECTED Final   Pseudomonas aeruginosa NOT DETECTED NOT  DETECTED Final   Candida albicans NOT DETECTED NOT DETECTED Final   Candida glabrata NOT DETECTED NOT DETECTED Final   Candida krusei NOT DETECTED NOT DETECTED Final   Candida parapsilosis NOT DETECTED NOT DETECTED Final   Candida tropicalis NOT DETECTED NOT DETECTED Final    Comment: Performed at [facility], [address] [place], [place] [address]  Acid Fast Smear (AFB)     Status: None   Collection Time: 06/27/19 11:45 AM   Specimen: Abdomen; Peritoneal Fluid  Result Value Ref Range Status   AFB Specimen Processing Comment  Final    Comment: Tissue Grinding and Digestion/Decontamination   Acid Fast Smear Negative  Final    Comment: (NOTE) Performed At: [facility] [address] [place], [place] [id number] Jolene Schimke MD QI:[id number]    Source (AFB) ABDOMEN  Final    Comment: Performed at [facility], [address] [place], [place] [address]  Culture, body fluid-bottle     Status: None (Preliminary result)   Collection Time: 06/27/19 11:45 AM   Specimen: Abdomen  Result Value Ref Range Status   Specimen Description ABDOMEN  Final   Special Requests NONE  Final   Culture   Final    NO GROWTH 2 DAYS Performed at [facility], [address] [place], [place] [address]    Report Status PENDING  Incomplete  Gram stain     Status: None   Collection Time: 06/27/19 11:45 AM   Specimen: Abdomen  Result Value Ref Range Status   Specimen Description ABDOMEN  Final   Special Requests NONE  Final   Gram Stain   Final    RARE WBC PRESENT,BOTH PMN AND MONONUCLEAR NO ORGANISMS SEEN Performed at [facility], [address] [place], [place] [address]    Report Status 06/27/2019 FINAL  Final  Anaerobic culture     Status: None (Preliminary result)   Collection Time: 06/27/19  4:00 PM   Specimen: PATH Cytology CSF; Cerebrospinal Fluid  Result Value Ref Range Status   Specimen Description   Final    CSF Performed at [facility], [address] [place], [place] [address]    Special Requests   Final    NONE Performed at [facility], [address], [place], [place] [address]    Culture   Final    NO ANAEROBES ISOLATED; CULTURE IN PROGRESS FOR 5 DAYS   Report Status PENDING  Incomplete  CSF culture     Status: None (Preliminary result)   Collection Time: 06/27/19  4:00 PM   Specimen: PATH Cytology CSF; Cerebrospinal Fluid  Result Value Ref Range Status   Specimen Description   Final    CSF Performed at [facility], [address], [place], [place] [address]    Special Requests   Final    NONE Performed at [facility], [address] [place], [place] [address]    Gram Stain   Final    WBC PRESENT,BOTH PMN AND MONONUCLEAR NO ORGANISMS SEEN CYTOSPIN SMEAR    Culture   Final    NO GROWTH 2 DAYS Performed at [facility], [address] [place], [place] [address]    Report Status PENDING  Incomplete  Culture, fungus without smear     Status: None (Preliminary result)   Collection Time: 06/27/19  4:00 PM   Specimen: PATH Cytology CSF; Cerebrospinal Fluid  Result Value Ref Range Status   Specimen Description   Final    CSF Performed at [facility], [address] [place], [place] [address]    Special Requests   Final    NONE Performed at [facility], [address], [place], [place] [address]    Culture   Final    NO FUNGUS ISOLATED AFTER 2 DAYS Performed at [facility], [address] [place], [place] [address]    Report Status PENDING  Incomplete         Radiology Studies: Mr Laqueta Jean OV Contrast  Result Date: 06/29/2019 CLINICAL DATA:  Encephalopathy.  History of breast cancer. EXAM: MRI HEAD WITHOUT AND WITH CONTRAST TECHNIQUE: Multiplanar, multiecho pulse sequences of the brain and surrounding structures were obtained without and with intravenous  contrast. CONTRAST:  7mL GADAVIST GADOBUTROL 1 MMOL/ML IV SOLN COMPARISON:  Head CT 06/27/2019 FINDINGS: Brain: There is no evidence of acute infarct, intracranial hemorrhage, mass, midline shift, or extra-axial fluid collection. The ventricles and sulci are within normal limits for age. No significant cerebral white matter disease is evident. No abnormal enhancement is identified. There is a mildly expanded partially empty sella. Vascular: Major intracranial vascular flow voids are preserved. Skull and upper cervical spine: Unremarkable bone marrow signal. Sinuses/Orbits: Unremarkable orbits. Completely opacified and mildly expanded left maxillary sinus. Other paranasal sinuses and mastoid air cells are clear. Other: None. IMPRESSION: 1. No evidence of acute intracranial abnormality or intracranial metastases. 2. Partially empty sella. Electronically Signed   By: Sebastian Ache M.D.   On: 06/29/2019 14:45        Scheduled Meds:  amLODipine  5 mg Oral Daily   chlorhexidine  15 mL Mouth Rinse BID   Chlorhexidine  Gluconate Cloth  6 each Topical Daily   famotidine  20 mg Oral Daily   feeding supplement  1 Container Oral Q24H   feeding supplement (ENSURE ENLIVE)  237 mL Oral Q24H   feeding supplement (PRO-STAT SUGAR FREE 64)  30 mL Oral Daily   gabapentin  100 mg Oral TID   mouth rinse  15 mL Mouth Rinse q12n4p   mirtazapine  7.5 mg Oral QHS   multivitamin with minerals  1 tablet Oral Daily   QUEtiapine  25 mg Oral Daily   rivaroxaban  20 mg Oral Q supper   sodium chloride flush  10-40 mL Intracatheter Q12H   Continuous Infusions:   LOS: 10 days   Time spent= 35 mins    Kiani Wurtzel Joline Maxcy, MD [facility]  If 7PM-7AM, please contact night-coverage [email or web address] 06/30/2019, 7:34 AM

## 2019-06-30 NOTE — Progress Notes (Signed)
Kaylee Randolph is resting this morning.  She is little bit more lethargic.  She had an MRI of the brain yesterday.  This looked relatively unremarkable.  No obvious masses were noted.  There is no comment upon meningeal thickening.  Her labs show a white cell count of 11.2.  Hemoglobin 8.5.  Platelet count 538,000.  Her calcium is 8.6 with an albumin of 1.8.  Her sodium is 138.  Her blood sugar is 93.  Her creatinine is 0.7.  She really is not eating much.  I do not think that there is much pain.  Her vital signs show temperature 154/106.  Pulse is 125.  Temperature 97.9.  Her cardiac exam is tachycardic but regular.  Lungs sound relatively clear bilaterally.  Abdomen is soft.  Bowel sounds are decreased.  This man has metastatic breast cancer.  She received single agent carboplatinum on Friday.  Looks like she is doing okay with this without any obvious nausea or vomiting.  I appreciate the great care that she is getting from all the staff in the ICU.  Lattie Haw, MD  2 Corinthians 5:7

## 2019-07-01 ENCOUNTER — Inpatient Hospital Stay (HOSPITAL_COMMUNITY): Payer: BC Managed Care – PPO

## 2019-07-01 LAB — BASIC METABOLIC PANEL
Anion gap: 8 (ref 5–15)
BUN: 21 mg/dL — ABNORMAL HIGH (ref 6–20)
CO2: 17 mmol/L — ABNORMAL LOW (ref 22–32)
Calcium: 8.8 mg/dL — ABNORMAL LOW (ref 8.9–10.3)
Chloride: 115 mmol/L — ABNORMAL HIGH (ref 98–111)
Creatinine, Ser: 0.77 mg/dL (ref 0.44–1.00)
GFR calc Af Amer: >60 mL/min (ref >60)
GFR calc non Af Amer: >60 mL/min (ref >60)
Glucose, Bld: 88 mg/dL (ref 70–99)
Potassium: 4.2 mmol/L (ref 3.5–5.1)
Sodium: 140 mmol/L (ref 135–145)

## 2019-07-01 LAB — CBC
HCT: 27.3 % — ABNORMAL LOW (ref 36.0–46.0)
Hemoglobin: 8.5 g/dL — ABNORMAL LOW (ref 12.0–15.0)
MCH: 27.2 pg (ref 26.0–34.0)
MCHC: 31.1 g/dL (ref 30.0–36.0)
MCV: 87.2 fL (ref 80.0–100.0)
Platelets: 556 10*3/uL — ABNORMAL HIGH (ref 150–400)
RBC: 3.13 MIL/uL — ABNORMAL LOW (ref 3.87–5.11)
RDW: 19.8 % — ABNORMAL HIGH (ref 11.5–15.5)
WBC: 9.3 10*3/uL (ref 4.0–10.5)
nRBC: 0.2 % (ref 0.0–0.2)

## 2019-07-01 LAB — GLUCOSE, CAPILLARY
Glucose-Capillary: 87 mg/dL (ref 70–99)
Glucose-Capillary: 93 mg/dL (ref 70–99)
Glucose-Capillary: 76 mg/dL (ref 70–99)
Glucose-Capillary: 82 mg/dL (ref 70–99)
Glucose-Capillary: 85 mg/dL (ref 70–99)
Glucose-Capillary: 112 mg/dL — ABNORMAL HIGH (ref 70–99)
Glucose-Capillary: 105 mg/dL — ABNORMAL HIGH (ref 70–99)
Glucose-Capillary: 107 mg/dL — ABNORMAL HIGH (ref 70–99)

## 2019-07-01 LAB — CULTURE, BLOOD (ROUTINE X 2): Culture: NO GROWTH

## 2019-07-01 LAB — CYTOLOGY - NON PAP

## 2019-07-01 LAB — MAGNESIUM: Magnesium: 1.8 mg/dL (ref 1.7–2.4)

## 2019-07-01 LAB — BRAIN NATRIURETIC PEPTIDE: B Natriuretic Peptide: 64.9 pg/mL (ref 0.0–100.0)

## 2019-07-01 MED ORDER — IPRATROPIUM-ALBUTEROL 0.5-2.5 (3) MG/3ML IN SOLN: 3.0000 mL | RESPIRATORY_TRACT | Status: DC | PRN

## 2019-07-01 MED ORDER — METOPROLOL TARTRATE 25 MG PO TABS
25.0000 mg | ORAL_TABLET | Freq: Two times a day (BID) | ORAL | Status: DC
  Administered 2019-07-01 – 2019-07-02 (×3): 25 mg via ORAL
  Filled 2019-07-01 (×3): qty 1

## 2019-07-01 MED ORDER — METOPROLOL TARTRATE 5 MG/5ML IV SOLN
5.0000 mg | INTRAVENOUS | Status: DC | PRN
  Administered 2019-07-02 – 2019-07-05 (×3): 5 mg via INTRAVENOUS
  Filled 2019-07-01 (×3): qty 5

## 2019-07-01 MED ORDER — SODIUM CHLORIDE 0.9 % IV SOLN: INTRAVENOUS | Status: DC

## 2019-07-01 NOTE — Progress Notes (Addendum)
HEMATOLOGY-ONCOLOGY PROGRESS NOTE  SUBJECTIVE: Received Carboplatin for an AUC of 5 on 06/28/2019. Reports nausea this morning, but no vomiting. States her back and stomach hurt today. Eating only small bites of breakfast. Tmax 99.4 over the past 24 hours.   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    06/28/2019 -  Chemotherapy   The patient had palonosetron (ALOXI) injection 0.25 mg, 0.25 mg, Intravenous,  Once, 1 of 4 cycles CARBOplatin (PARAPLATIN) 530 mg in sodium chloride 0.9 % 250 mL chemo infusion, 530 mg (100 % of original dose 532.5 mg), Intravenous,  Once, 1 of 4 cycles Dose modification:   (original dose 532.5 mg, Cycle 1) fosaprepitant (EMEND) 150 mg, dexamethasone (DECADRON) 12 mg in sodium chloride 0.9 % 145 mL IVPB, , Intravenous,  Once, 1 of 4 cycles  for chemotherapy treatment.       REVIEW OF SYSTEMS:   Constitutional: Fevers improving. No chills.  Eyes: Denies blurriness of vision Ears, nose, mouth, throat, and face: Denies mucositis or sore throat Respiratory: Denies cough, dyspnea or wheezes Cardiovascular: Denies palpitation, chest discomfort Gastrointestinal:  Reports abdominal pain and nausea. No vomiting.  Skin: Denies abnormal skin rashes Lymphatics: Denies new lymphadenopathy or easy bruising Neurological:Denies numbness, tingling or new weaknesses Behavioral/Psych: Mood is stable, no new changes  Extremities: No lower extremity edema All other systems were reviewed with the patient and are negative.  I have reviewed the past medical history, past surgical history, social history and family history with the patient and they are unchanged from previous note.   PHYSICAL EXAMINATION: ECOG PERFORMANCE STATUS: 3 -  Symptomatic, >50% confined to bed  Vitals:   07/01/19 0600 07/01/19 0800  BP: 136/87   Pulse:    Resp: 14   Temp:  99.4 F (37.4 C)  SpO2: 100%    Filed Weights   06/20/19 2014  Weight: 140 lb (63.5 kg)    Intake/Output from previous day: 10/11 0701 - 10/12 0700 In: 35.5 [I.V.:35.5] Out: 1300 [Urine:1300]  GENERAL:alert, no distress and comfortable. SKIN: skin color, texture, turgor are normal, no rashes or significant lesions EYES: normal, Conjunctiva are pink and non-injected, sclera clear OROPHARYNX:no exudate, no erythema and lips, buccal mucosa, and tongue normal  NECK: supple, thyroid normal size, non-tender, without nodularity LYMPH:  no palpable lymphadenopathy in the cervical, axillary or inguinal LUNGS: clear to auscultation and percussion with normal breathing effort HEART: Tachycardic. No murmurs. No LE edema.  ABDOMEN: Positive bowel sounds. Mild distention.  Musculoskeletal:no cyanosis of digits and no clubbing  NEURO: Alert. Mild confusion. Sensation in BLE intact. Able to move toes on left foot. Unable to move toes on right foot.   LABORATORY DATA:  I have reviewed the data as listed CMP Latest Ref Rng & Units 07/01/2019 06/30/2019 06/29/2019  Glucose 70 - 99 mg/dL 88 93 132(H)  BUN 6 - 20 mg/dL 21(H) 19 16  Creatinine 0.44 - 1.00 mg/dL 0.77 0.70 0.70  Sodium 135 - 145 mmol/L 140 138 137  Potassium 3.5 - 5.1 mmol/L 4.2 4.8 5.4(H)  Chloride 98 - 111 mmol/L 115(H) 113(H) 114(H)  CO2 22 - 32 mmol/L 17(L) 17(L) 16(L)  Calcium 8.9 - 10.3 mg/dL 8.8(L) 8.6(L) 8.0(L)  Total Protein 6.5 - 8.1 g/dL - 5.1(L) -  Total  Bilirubin 0.3 - 1.2 mg/dL - 0.6 -  Alkaline Phos 38 - 126 U/L - 84 -  AST 15 - 41 U/L - 14(L) -  ALT 0 - 44 U/L - 12 -    Lab Results  Component Value Date   WBC 9.3 07/01/2019   HGB 8.5 (L) 07/01/2019   HCT 27.3 (L) 07/01/2019   MCV 87.2 07/01/2019   PLT 556 (H) 07/01/2019   NEUTROABS 9.5 (H) 06/30/2019    Dg Abd 1 View  Result Date:  06/24/2019 CLINICAL DATA:  Nasogastric tube placement EXAM: ABDOMEN - 1 VIEW COMPARISON:  None. FINDINGS: Tip and side port of the nasogastric tube project over the stomach. Nonobstructive bowel gas pattern. IMPRESSION: Nasogastric tube tip in the stomach. Electronically Signed   By: Ulyses Jarred M.D.   On: 06/24/2019 03:36   Dg Abd 1 View  Result Date: 06/23/2019 CLINICAL DATA:  Enteric tube placement EXAM: ABDOMEN - 1 VIEW COMPARISON:  Abdominal radiograph from earlier today FINDINGS: Enteric tube terminates in the distal stomach. Persistent dilated small bowel loops up to the 4.3 cm in the left abdomen. No evidence of pneumatosis or pneumoperitoneum. Small right pleural effusion appears unchanged. Patchy bibasilar lung opacities appear unchanged. No radiopaque nephrolithiasis. IMPRESSION: 1. Enteric tube terminates in the distal stomach. 2. Persistent dilated small bowel loops in the left abdomen compatible with mid to distal small bowel obstruction. 3. Stable small right pleural effusion and patchy bibasilar lung opacities. Electronically Signed   By: Ilona Sorrel M.D.   On: 06/23/2019 11:49   Dg Abd 1 View  Result Date: 06/23/2019 CLINICAL DATA:  Nasogastric tube placed EXAM: ABDOMEN - 1 VIEW COMPARISON:  None. FINDINGS: Nasogastric tube with the tip projecting over the body of the stomach. There is no bowel dilatation to suggest obstruction. There is no evidence of pneumoperitoneum, portal venous gas or pneumatosis. There are no pathologic calcifications along the expected course of the ureters. The osseous structures are unremarkable. IMPRESSION: Nasogastric tube with the tip projecting over the body of the stomach. Electronically Signed   By: Kathreen Devoid   On: 06/23/2019 09:47   Dg Abd 1 View  Result Date: 06/22/2019 CLINICAL DATA:  NG tube placement EXAM: ABDOMEN - 1 VIEW COMPARISON:  None. FINDINGS: Enteric tube looped in the stomach with its tip in the distal gastric body. Nonobstructive  bowel gas pattern. Visualized osseous structures are within normal limits. IMPRESSION: Enteric tube with its tip in the distal gastric body. Electronically Signed   By: Julian Hy M.D.   On: 06/22/2019 13:43   Ct Head Wo Contrast  Result Date: 06/27/2019 CLINICAL DATA:  53 year old female with metastatic breast cancer. Known spinal cord involvement. Encephalopathy. EXAM: CT HEAD WITHOUT CONTRAST TECHNIQUE: Contiguous axial images were obtained from the base of the skull through the vertex without intravenous contrast. COMPARISON:  None. FINDINGS: Brain: Partially empty sella. No midline shift, ventriculomegaly, mass effect, evidence of mass lesion, intracranial hemorrhage or evidence of cortically based acute infarction. Gray-white matter differentiation is within normal limits throughout the brain. No contrast administered. Vascular: No suspicious intracranial vascular hyperdensity. Skull: Hyperostosis of the calvarium with no destructive or suspicious skull lesion identified. Sinuses/Orbits: Partially visible left maxillary sinus opacification but the other visualized paranasal sinuses and mastoids are clear. Other: Visualized orbits and scalp soft tissues are within normal limits. IMPRESSION: 1. Negative noncontrast CT appearance of the brain. Brain MRI without and with contrast would be most sensitive for early metastatic disease. 2. No  skull metastasis identified. Partially visible opacification of the left maxillary sinus. Electronically Signed   By: Genevie Ann M.D.   On: 06/27/2019 12:23   Mr Jeri Cos [id number] Contrast  Result Date: 06/29/2019 CLINICAL DATA:  Encephalopathy.  History of breast cancer. EXAM: MRI HEAD WITHOUT AND WITH CONTRAST TECHNIQUE: Multiplanar, multiecho pulse sequences of the brain and surrounding structures were obtained without and with intravenous contrast. CONTRAST:  38mL GADAVIST GADOBUTROL 1 MMOL/ML IV SOLN COMPARISON:  Head CT 06/27/2019 FINDINGS: Brain: There is no evidence  of acute infarct, intracranial hemorrhage, mass, midline shift, or extra-axial fluid collection. The ventricles and sulci are within normal limits for age. No significant cerebral white matter disease is evident. No abnormal enhancement is identified. There is a mildly expanded partially empty sella. Vascular: Major intracranial vascular flow voids are preserved. Skull and upper cervical spine: Unremarkable bone marrow signal. Sinuses/Orbits: Unremarkable orbits. Completely opacified and mildly expanded left maxillary sinus. Other paranasal sinuses and mastoid air cells are clear. Other: None. IMPRESSION: 1. No evidence of acute intracranial abnormality or intracranial metastases. 2. Partially empty sella. Electronically Signed   By: Logan Bores M.D.   On: 06/29/2019 14:45   Ct Abdomen Pelvis W Contrast  Result Date: 06/21/2019 CLINICAL DATA:  Abdominal pain, fever, metastatic breast cancer, peritoneal carcinoma EXAM: CT ABDOMEN AND PELVIS WITH CONTRAST TECHNIQUE: Multidetector CT imaging of the abdomen and pelvis was performed using the standard protocol following bolus administration of intravenous contrast. CONTRAST:  168mL OMNIPAQUE IOHEXOL 300 MG/ML SOLN, 74mL OMNIPAQUE IOHEXOL 300 MG/ML SOLN COMPARISON:  None. FINDINGS: Lower chest: Moderate right, small left pleural effusions with associated atelectasis or consolidation. No definite pulmonary nodules or pleural nodularity noted. Hepatobiliary: Numerous low-attenuation liver lesions of varying sizes, incompletely characterized and possibly representing cysts versus metastases. No gallstones, gallbladder wall thickening, or biliary dilatation. Pancreas: Unremarkable. No pancreatic ductal dilatation or surrounding inflammatory changes. Spleen: Normal in size without significant abnormality. Adrenals/Urinary Tract: Adrenal glands are unremarkable. Kidneys are normal, without renal calculi, solid lesion, or hydronephrosis. Foley catheter in the decompressed  urinary bladder. Stomach/Bowel: The stomach is grossly fluid distended. The small bowel is fluid distended, largest loops measuring 3.9 cm, with overt fecalization of the most distal small bowel and a tightly knuckled transition point in the central right lower quadrant (series 2, image 63). There is gas and stool within the colon to the rectum. Vascular/Lymphatic: No significant vascular findings are present. No enlarged abdominal or pelvic lymph nodes. Reproductive: Large uterine fibroid. Other: No abdominal wall hernia or abnormality. Moderate volume ascites with diffuse peritoneal and omental nodularity, consistent with carcinomatosis. Musculoskeletal: Status post laminectomy of T11 and T12. IMPRESSION: 1. The stomach is grossly fluid distended. The small bowel is fluid distended, largest loops measuring 3.9 cm, with overt fecalization of the most distal small bowel and a tightly knuckled transition point in the central right lower quadrant (series 2, image 63). Findings are consistent with distal small bowel obstruction, which may be incomplete given the presence of gas and stool in the colon and rectum. 2. Moderate volume ascites with diffuse peritoneal and omental nodularity, consistent with carcinomatosis. 3. Numerous low-attenuation liver lesions of varying sizes, incompletely characterized and possibly representing cysts versus metastases. 4. Moderate right, small left pleural effusions with associated atelectasis or consolidation, presumably malignant. No definite pulmonary nodules or pleural nodularity noted. 5. Status post laminectomy of T11 and T12. Reported spinal metastatic disease is not clearly appreciated by CT and better assessed by MRI if acute complication  is suspected. These results will be called to the ordering clinician or representative by the Radiologist Assistant, and communication documented in the PACS or zVision Dashboard. Electronically Signed   By: Eddie Candle M.D.   On: 06/21/2019  17:49   US Paracentesis  Result Date: 06/27/2019 INDICATION: Patient with history of metastatic breast cancer, small-bowel obstruction, fever, encephalopathy, PE, cauda equina syndrome, ascites. Request made for diagnostic and therapeutic paracentesis. EXAM: ULTRASOUND GUIDED DIAGNOSTIC AND THERAPEUTIC PARACENTESIS MEDICATIONS: None COMPLICATIONS: None immediate. PROCEDURE: Informed written consent was obtained from the patient's husband after a discussion of the risks, benefits and alternatives to treatment. A timeout was performed prior to the initiation of the procedure. Initial ultrasound scanning demonstrates a small to moderate amount of ascites within the left mid to lower abdominal quadrant. The left mid to lower abdomen was prepped and draped in the usual sterile fashion. 1% lidocaine was used for local anesthesia. Following this, a 19 gauge, 7-cm, Yueh catheter was introduced. An ultrasound image was saved for documentation purposes. The paracentesis was performed. The catheter was removed and a dressing was applied. The patient tolerated the procedure well without immediate post procedural complication. FINDINGS: A total of approximately 2.2 liters of yellow fluid was removed. Samples were sent to the laboratory as requested by the clinical team. IMPRESSION: Successful ultrasound-guided diagnostic and therapeutic paracentesis yielding 2.2 liters of peritoneal fluid. Read by: Rowe Robert, PA-C Electronically Signed   By: Jacqulynn Cadet M.D.   On: 06/27/2019 12:46   Dg Chest Port 1 View  Result Date: 07/01/2019 CLINICAL DATA:  Dyspnea EXAM: PORTABLE CHEST 1 VIEW COMPARISON:  06/20/2019 FINDINGS: Moderate right and small left pleural effusions. No frank interstitial edema. No pneumothorax. The heart is top-normal in size. Right chest port terminates the cavoatrial junction. IMPRESSION: Moderate right and small left pleural effusions. No frank interstitial edema. Electronically Signed   By:  Julian Hy M.D.   On: 07/01/2019 08:05   Dg Chest Port 1 View  Result Date: 06/20/2019 CLINICAL DATA:  53 year old female with history of metastatic breast cancer. Patient is febrile and confused. EXAM: PORTABLE CHEST 1 VIEW COMPARISON:  No priors. FINDINGS: Low lung volumes. Opacity at the right base which may reflect atelectasis and/or consolidation. Small to moderate right pleural effusion. Left lung is clear. No left pleural effusion. No evidence of pulmonary edema. Heart size is normal. Upper mediastinal contours are within normal limits. Right internal jugular single-lumen porta cath with tip terminating in the mid superior vena cava. IMPRESSION: 1. Atelectasis and/or consolidation in the right lung base with small to moderate right pleural effusion. Electronically Signed   By: Vinnie Langton M.D.   On: 06/20/2019 20:28   Dg Fluoro Guided Needle Plc Aspiration/injection Loc  Result Date: 06/27/2019 CLINICAL DATA:  History of breast cancer, fevers, altered mental status. EXAM: DIAGNOSTIC LUMBAR PUNCTURE UNDER FLUOROSCOPIC GUIDANCE FLUOROSCOPY TIME:  Fluoroscopy Time:  30 seconds Radiation Exposure Index (if provided by the fluoroscopic device): 3.7 mGy Number of Acquired Spot Images: 0 PROCEDURE: Informed consent was obtained from the patient prior to the procedure, including potential complications of headache, allergy, and pain. With the patient prone, the lower back was prepped with Betadine. 1% Lidocaine was used for local anesthesia. Lumbar puncture was performed at the L2-L3 level using a 22 gauge needle with return of clear CSF with normal-appearing opening pressure. Ten ml of CSF were obtained for laboratory studies. The patient tolerated the procedure well and there were no apparent complications. IMPRESSION: 1. Technically  successful lumbar puncture, 10 cc obtained and sent for requested testing. Electronically Signed   By: Zetta Bills M.D.   On: 06/27/2019 17:30    ASSESSMENT  AND PLAN: 1.  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 Has prior history of small bowel obstruction due to peritoneal carcinomatosis.Spine metastasis causing cauda equina syndrome.  In the hospital with intermittent fevers with no source of infection. S/P Carboplatin on 06/28/2019. Tolerated well with the exception of nausea. Continue PRN antiemetics. Fevers improving.  Will reach out to Dr. Wetzel Bjornstad at [facility] to confirm schedule of carboplatin (weekly versus day 1 and day 8 of a 21-day cycle versus every 3 weeks) if the patient is discharged this week, recommend follow-up at [facility] for ongoing treatment with chemotherapy.  If she remains admitted and is due for chemotherapy this week, we will will administer this here.  2. Anemia due to underlying malignancy and recent chemo. Hemoglobin is stable. No transfusion indicated.   3.  Pulmonary embolism on anticoagulation with Xarelto.  4.  Acute change in mental status: According to the nursing notes, she is slightly better but continues to have disorientation.  Husband at bedside and updated on current status.  Overall patient's prognosis is poor.    LOS: 11 days   Mikey Bussing, DNP, AGPCNP-BC, AOCNP 07/01/19  Addendum: Attending Note  I personally saw the patient, reviewed the chart and examined the patient. The plan of care was discussed with the patient's husband. Metastatic breast cancer with peritoneal carcinomatosis and spine metastases.  Received first dose of carboplatin last Friday because of suspected cancer associated fevers without any infectious basis. Over the weekend she tolerated the treatment fairly well without any major problems or concerns. Fevers have come down. I will inform Dr. Wetzel Bjornstad about patient's treatment so far and if she gets discharged she can follow-up with Dr. Wetzel Bjornstad to continue with the chemotherapy plan.  Acute change in mental status: Patient was sleeping throughout my  visit. Pulmonary embolism on Xarelto

## 2019-07-01 NOTE — Progress Notes (Signed)
Daily Progress Note   Patient Name: Kaylee Randolph       Date: 07/01/2019 DOB: [birth year]  Age: 53 y.o. MRN#: [id number] Attending Physician: Damita Lack, MD Primary Care Physician: Maury Dus, MD Admit Date: 06/20/2019  Reason for Consultation/Follow-up: Establishing goals of care  Subjective: I saw and examined Kaylee Randolph and met with patient's husband at the bedside.  We discussed her clinical course over the weekend as well as his hope that she will continue to stabilize to the point that they can follow-up with her oncologist at [facility] to discuss potential for further disease modifying therapy.    We talked about the goal of disease modifying therapy to be to add time in quality to her life and that if we are at a point where oncology does not think that she is likely to benefit from further disease modifying therapy, this does not mean there is not care left to give, however, it means that we should focus on maintaining her functional status and symptom control as well as possible.  We talked again about hospice and how could benefit her when the time comes that she is not going to benefit from further systemic therapy.  Kaylee Randolph again reports being appreciative of information and states he is open to changing care plan in the future.  At this time, he remains invested in plan for discharging home with home health and following up with oncology as an outpatient discuss further.  He noted plan to go back and see Dr. Wetzel Bjornstad at [facility], however, he then said he may want to consider follow-up care with Dr. Lindi Adie as it may be easier as he is in town.   Length of Stay: 11  Current Medications: Scheduled Meds:  . amLODipine  5 mg Oral Daily  . chlorhexidine  15 mL Mouth Rinse BID  .  Chlorhexidine Gluconate Cloth  6 each Topical Daily  . famotidine  20 mg Oral Daily  . feeding supplement  1 Container Oral Q24H  . feeding supplement (ENSURE ENLIVE)  237 mL Oral Q24H  . feeding supplement (PRO-STAT SUGAR FREE 64)  30 mL Oral Daily  . gabapentin  100 mg Oral TID  . mouth rinse  15 mL Mouth Rinse q12n4p  . metoprolol tartrate  25 mg Oral BID  . mirtazapine  7.5 mg Oral QHS  . multivitamin with minerals  1 tablet Oral Daily  . QUEtiapine  25 mg Oral Daily  . rivaroxaban  20 mg Oral Q supper  . sodium chloride flush  10-40 mL Intracatheter Q12H    Continuous Infusions:   PRN Meds: acetaminophen **OR** acetaminophen, HYDROmorphone (DILAUDID) injection, hyoscyamine, ipratropium-albuterol, metoprolol tartrate, ondansetron **OR** ondansetron (ZOFRAN) IV, phenol, promethazine, sodium chloride flush, tiZANidine  Physical Exam  General: Alert, awake, appears anxious Heart: Regular rate and rhythm on monitor.  Resp: No resp distress Ext: Some edema  Vital Signs: BP (!) 146/84 (BP Location: Left Arm)   Pulse (!) 126   Temp 99.4 F (37.4 C) (Oral)   Resp (!) 27   Ht _0  (1.626 m)   Wt 63.5 kg   SpO2 97%   BMI 24.03 kg/m  SpO2: SpO2: 97 % O2 Device: O2 Device: Room Air O2 Flow Rate:    Intake/output summary:   Intake/Output Summary (Last 24 hours) at 07/01/2019 1306 Last data filed at 07/01/2019 9371 Gross per 24 hour  Intake 35.46 ml  Output 1300 ml  Net -1264.54 ml   LBM: Last BM Date: 06/26/19 Baseline Weight: Weight: 63.5 kg Most recent weight: Weight: 63.5 kg       Palliative Assessment/Data:    Flowsheet Rows     Most Recent Value  Intake Tab  Referral Department  Hospitalist  Unit at Time of Referral  ICU  Palliative Care Primary Diagnosis  Cancer  Date Notified  06/26/19  Palliative Care Type  New Palliative care  Reason for referral  Clarify Goals of Care  Date of Admission  06/20/19  Date first seen by Palliative Care  06/26/19  #  of days Palliative referral response time  0 Day(s)  # of days IP prior to Palliative referral  6  Clinical Assessment  Palliative Performance Scale Score  40%  Psychosocial & Spiritual Assessment  Palliative Care Outcomes  Patient/Family meeting held?  Yes  Who was at the meeting?  Patient, family      Patient Active Problem List   Diagnosis Date Noted  . SIRS (systemic inflammatory response syndrome) ([place]) 06/29/2019  . Cauda equina compression ([place]) 06/21/2019  . PE (pulmonary thromboembolism) ([place]) 06/21/2019  . Sacral decubitus ulcer, stage II ([place]) 06/21/2019  . Hypertension   . 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 Acute metabolic encephalopathy   . Hyponatremia   . AKI (acute kidney injury) ([place])   . Sepsis ([place])   . Normocytic anemia     Palliative Care Assessment & Plan   Patient Profile: 53 y.o. 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  She has unclear source of fever and has completed 5 days antibiotic therapy.  Blood and urine cultures did not show growth.  ID consulted today.  Palliative consulted for goals of care.  Of note, she does follow with palliative care at [facility] where she receives her chemotherapy.  Last note reviewed there reveals visit focused on symptom management rather than advance care planning.  Assessment: Patient Active Problem List   Diagnosis Date Noted  . SIRS (systemic inflammatory response syndrome) ([place]) 06/29/2019  . Cauda equina compression ([place]) 06/21/2019  . PE (pulmonary thromboembolism) ([place]) 06/21/2019  . Sacral  decubitus ulcer, stage II ([place]) 06/21/2019  . Hypertension   . 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 Acute metabolic  encephalopathy   . Hyponatremia   . AKI (acute kidney injury) ([place])   . Sepsis ([place])   . Normocytic anemia    Recommendations/Plan:  Partial code- DNI but plan for CPR in event of cardiac arrest.  She has tolerated systemic therapy given on Friday well.  No documented fever that I note over the weekend.  Her husband remains hopeful for some stabilization and that she will be able to follow up with her oncologist after discharge for consideration for additional systemic therapy.  They are not interested in hospice services at this point.  Paranoia seems improved today.  Goals at this point seem clear to discharge when medically ready and follow-up with oncology as an outpatient.  Palliative will continue to follow chart peripherally, but will not round on patient daily.  Her husband has my contact information and will call our team if we can be of further assistance.    If there are particular areas which our team can be of assistance to Ms. Ingber, please let us know by calling [phone].  Goals of Care and Additional Recommendations:  Limitations on Scope of Treatment: Full Scope Treatment  Code Status:    Code Status Orders  (From admission, onward)         Start     Ordered   06/27/19 1448  Limited resuscitation (code)  Continuous    Question Answer Comment  In the event of cardiac or respiratory ARREST: Initiate Code Blue, Call Rapid Response Yes   In the event of cardiac or respiratory ARREST: Perform CPR Yes   In the event of cardiac or respiratory ARREST: Perform Intubation/Mechanical Ventilation No   In the event of cardiac or respiratory ARREST: Use NIPPV/BiPAp only if indicated Yes   In the event of cardiac or respiratory ARREST: Administer ACLS medications if indicated Yes   In the event of cardiac or respiratory ARREST: Perform Defibrillation or Cardioversion if indicated Yes      06/27/19 1447        Code Status History    Date Active Date Inactive Code  Status Order ID Comments User Context   06/21/2019 0441 06/27/2019 1447 Full Code [id number]  Ivor Costa, MD Inpatient   Advance Care Planning Activity    Advance Directive Documentation     Most Recent Value  Type of Advance Directive  Living will  Pre-existing out of facility DNR order (yellow form or pink MOST form)  -  "MOST" Form in Place?  -       Prognosis:   Unable to determine  Discharge Planning:  To Be Determined- Not interested in hospice at this point- ? Home with home [place] was discussed with RN, patient, husband Kaylee Randolph  Thank you for allowing the Palliative Medicine Team to assist in the care of this patient.   Time In: 1030 Time Out: 1115 Total Time 45 Prolonged Time Billed No      Greater than 50%  of this time was spent counseling and coordinating care related to the above assessment and plan.  Micheline Rough, MD  Please contact Palliative Medicine Team phone at [phone] for questions and concerns.

## 2019-07-01 NOTE — Progress Notes (Addendum)
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  Initially diagnosed with E. coli UTI treated with Cipro but due to persistent fevers without any other clear source was started on vancomycin and cefepime for presumed sepsis.  By infectious disease.   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  Drug-related versus malignancy. -Status post 5 days vancomycin/cefepime.  Suspect drug-related fevers - Lumbar puncture- no infection per preliminary results -Seen by infectious disease.  Now signed off  Moderate bilateral effusions - Third spacing.  Advised mobility. -Bronchodilators PRN, incentive spirometer and flutter.  Sinus tachycardia; persistent -She is mostly third spacing her fluids - Start metoprolol.  Small bowel obstruction secondary to peritoneal carcinomatosis/metastatic disease -Conservative management.  Seen by general surgeon  Intermittent delirium with paranoia -CT head negative.  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  Breast cancer with diffuse metastasis -Thrombocytopenia oncology status post recent  chemotherapy.  Seen by palliative care-opting for less aggressive measures. -Received a dose of carboplatin  Patient is DNR -MRI head- negative - Cardiology following.  History of pulmonary embolism - On Xarelto.  Cauda equina syndrome With neurogenic bladder -Secondary to spinal metastasis.  Foley replaced  Acute on chronic anemia -Monitor hemoglobin.  Malignant ascites - Status post paracentesis 10/8.  Stage II decubitus ulcer  Very poor prognosis secondary to advanced metastases.  DVT prophylaxis: Xarelto Code Status: DNI but okay with CPR. Family Communication: Attempted to call patient's husband couple of times, no response. Disposition Plan: Maintain hospital stay.  Sinus tachycardia.  In the meantime making arrangements for safe disposition.  Consultants:   Ortho  Infectious disease  Palliative care   Subjective: Still has some tachycardia with heart rate in 130s range.  Reporting of bilateral lower extremity and upper extremity swelling. Called patient's husband while I was in the room with her, he did not answer.  Review of Systems Otherwise negative except as per HPI, including: General = no fevers, chills, dizziness, malaise, fatigue HEENT/EYES = negative for pain, redness, loss of vision, double vision, blurred vision, loss of hearing, sore throat, hoarseness, dysphagia Cardiovascular= negative for chest pain, palpitation, murmurs, lower extremity swelling Respiratory/lungs= negative for shortness of breath, cough, hemoptysis, wheezing, mucus production Gastrointestinal= negative for nausea, vomiting,, abdominal pain, melena, hematemesis Genitourinary= negative for Dysuria, Hematuria, Change in Urinary Frequency MSK = Negative for arthralgia, myalgias, Back Pain, Joint swelling  Neurology= Negative for headache, seizures, numbness, tingling  Psychiatry= Negative for anxiety, depression, suicidal and homocidal ideation Allergy/Immunology=  Medication/Food allergy as listed  Skin= Negative for Rash, lesions, ulcers, itching  Objective: Vitals:   07/01/19 0400 07/01/19 0432 07/01/19 0600 07/01/19 0800  BP: (!) 154/89  136/87   Pulse:  Resp: (!) 24 13 14    Temp:    99.4 F (37.4 C)  TempSrc:    Oral  SpO2: 100%  100%   Weight:      Height:        Intake/Output Summary (Last 24 hours) at 07/01/2019 1116 Last data filed at 07/01/2019 3086 Gross per 24 hour  Intake 35.46 ml  Output 1300 ml  Net -1264.54 ml   Filed Weights   06/20/19 2014  Weight: 63.5 kg    Examination:  Constitutional: NAD, calm, comfortable Eyes: PERRL, lids and conjunctivae normal ENMT: Mucous membranes are moist. Posterior pharynx clear of any exudate or lesions.Normal dentition.  Neck: normal, supple, no masses, no thyromegaly Respiratory: clear to auscultation bilaterally, no wheezing, no crackles. Normal respiratory effort. No accessory muscle use.  Cardiovascular: Sinus tachycardia, regular rate and rhythm, no murmurs / rubs / gallops.  1+ lower extremity.  Extremity edema. 2+ pedal pulses. No carotid bruits.  Abdomen: no tenderness, no masses palpated. No hepatosplenomegaly. Bowel sounds positive.  Musculoskeletal: no clubbing / cyanosis. No joint deformity upper and lower extremities. Good ROM, no contractures. Normal muscle tone.  Skin: no rashes, lesions, ulcers. No induration Neurologic: CN 2-12 grossly intact. Sensation intact, DTR normal. Strength 5/5 in all 4.  Psychiatric: Normal judgment and insight. Alert and oriented x 3. Normal mood.   Chest wall port in place  Data Reviewed:   CBC: Recent Labs  Lab 06/26/19 0504 06/27/19 0157 06/29/19 0930 06/30/19 0545 07/01/19 0353  WBC 13.0* 10.6* 12.6* 11.2* 9.3  NEUTROABS  --   --   --  9.5*  --   HGB 8.4* 8.8* 9.0* 8.5* 8.5*  HCT 27.8* 29.1* 28.5* 27.5* 27.3*  MCV 90.8 89.5 87.2 87.3 87.2  PLT 384 418* 497* 538* 556*   Basic Metabolic Panel: Recent Labs  Lab  06/25/19 0154 06/28/19 1408 06/29/19 0930 06/30/19 0545 07/01/19 0353  NA 141 137 137 138 140  K 3.9 4.5 5.4* 4.8 4.2  CL 115* 113* 114* 113* 115*  CO2 18* 17* 16* 17* 17*  GLUCOSE 132* 93 132* 93 88  BUN 19 14 16 19  21*  CREATININE 0.58 0.62 0.70 0.70 0.77  CALCIUM 7.7* 8.5* 8.0* 8.6* 8.8*  MG  --  1.7  --   --  1.8   GFR: Estimated Creatinine Clearance: 70.2 mL/min (by C-G formula based on SCr of 0.77 mg/dL). Liver Function Tests: Recent Labs  Lab 06/25/19 0154 06/30/19 0545  AST 12* 14*  ALT 12 12  ALKPHOS 70 84  BILITOT 0.7 0.6  PROT 4.7* 5.1*  ALBUMIN 1.7* 1.8*   No results for input(s): LIPASE, AMYLASE in the last 168 hours. No results for input(s): AMMONIA in the last 168 hours. Coagulation Profile: Recent Labs  Lab 06/27/19 0856  INR 1.2   Cardiac Enzymes: No results for input(s): CKTOTAL, CKMB, CKMBINDEX, TROPONINI in the last 168 hours. BNP (last 3 results) No results for input(s): PROBNP in the last 8760 hours. HbA1C: No results for input(s): HGBA1C in the last 72 hours. CBG: Recent Labs  Lab 06/30/19 1141 06/30/19 1753 06/30/19 2002 06/30/19 2317 07/01/19 0318  GLUCAP 82 94 87 93 76   Lipid Profile: No results for input(s): CHOL, HDL, LDLCALC, TRIG, CHOLHDL, LDLDIRECT in the last 72 hours. Thyroid Function Tests: No results for input(s): TSH, T4TOTAL, FREET4, T3FREE, THYROIDAB in the last 72 hours. Anemia Panel: No results for input(s): VITAMINB12, FOLATE, FERRITIN, TIBC, IRON, RETICCTPCT in the last 72 hours.  Sepsis Labs: Recent Labs  Lab 06/25/19 1319 06/26/19 0504 06/27/19 0157  PROCALCITON 1.16 <0.10 2.16    Recent Results (from the past 240 hour(s))  Culture, blood (Routine X 2) w Reflex to ID Panel     Status: None   Collection Time: 06/26/19  1:40 PM   Specimen: BLOOD RIGHT HAND  Result Value Ref Range Status   Specimen Description   Final    BLOOD RIGHT HAND Performed at [facility], [address], [place], [place] [address]    Special Requests   Final    BOTTLES DRAWN AEROBIC ONLY Blood Culture results may not be optimal due to an inadequate volume of blood received in culture bottles Performed at [facility], [address] [place], [place] [address]    Culture   Final    NO GROWTH 5 DAYS Performed at [facility], [address], [place], [place] [address]    Report Status 07/01/2019 FINAL  Final  Culture, blood (Routine X 2) w Reflex to ID Panel     Status: Abnormal   Collection Time: 06/26/19  1:40 PM   Specimen: BLOOD  Result Value Ref Range Status   Specimen Description   Final    BLOOD LEFT ANTECUBITAL Performed at [facility], [address], [place], [place] [address]    Special Requests   Final    BOTTLES DRAWN AEROBIC ONLY Blood Culture results may not be optimal due to an inadequate volume of blood received in culture bottles Performed at [facility], [address], [place], [place] [address]    Culture  Setup Time   Final    AEROBIC BOTTLE ONLY GRAM POSITIVE COCCI CRITICAL RESULT CALLED TO, READ BACK BY AND VERIFIED WITH: PHARMD [id number] AT 701 AM BY CM    Culture (A)  Final    STAPHYLOCOCCUS SPECIES (COAGULASE NEGATIVE) THE SIGNIFICANCE OF ISOLATING THIS ORGANISM FROM A SINGLE SET OF BLOOD CULTURES WHEN MULTIPLE SETS ARE DRAWN IS UNCERTAIN. PLEASE NOTIFY THE MICROBIOLOGY DEPARTMENT WITHIN ONE WEEK IF SPECIATION AND SENSITIVITIES ARE REQUIRED. Performed at [facility], [address] [place], [place] [address]    Report Status 06/29/2019 FINAL  Final  Blood Culture ID Panel (Reflexed)     Status: Abnormal   Collection Time: 06/26/19  1:40 PM  Result Value Ref Range Status   Enterococcus species NOT DETECTED NOT DETECTED Final   Listeria monocytogenes NOT DETECTED NOT DETECTED Final   Staphylococcus species DETECTED (A) NOT DETECTED Final    Comment: Methicillin (oxacillin) resistant  coagulase negative staphylococcus. Possible blood culture contaminant (unless isolated from more than one blood culture draw or clinical case suggests pathogenicity). No antibiotic treatment is indicated for blood  culture contaminants. CRITICAL RESULT CALLED TO, READ BACK BY AND VERIFIED WITH: PHARMD [id number] AT 703 AM BY CM    Staphylococcus aureus (BCID) NOT DETECTED NOT DETECTED Final   Methicillin resistance DETECTED (A) NOT DETECTED Final    Comment: CRITICAL RESULT CALLED TO, READ BACK BY AND VERIFIED WITH: PHARMD T GREEN [id number] AT 703 BY CM    Streptococcus species NOT DETECTED NOT DETECTED Final   Streptococcus agalactiae NOT DETECTED NOT DETECTED Final   Streptococcus pneumoniae NOT DETECTED NOT DETECTED Final   Streptococcus pyogenes NOT DETECTED NOT DETECTED Final   Acinetobacter baumannii NOT DETECTED NOT DETECTED Final   Enterobacteriaceae species NOT DETECTED NOT DETECTED Final   Enterobacter cloacae complex NOT DETECTED NOT  DETECTED Final   Escherichia coli NOT DETECTED NOT DETECTED Final   Klebsiella oxytoca NOT DETECTED NOT DETECTED Final   Klebsiella pneumoniae NOT DETECTED NOT DETECTED Final   Proteus species NOT DETECTED NOT DETECTED Final   Serratia marcescens NOT DETECTED NOT DETECTED Final   Haemophilus influenzae NOT DETECTED NOT DETECTED Final   Neisseria meningitidis NOT DETECTED NOT DETECTED Final   Pseudomonas aeruginosa NOT DETECTED NOT DETECTED Final   Candida albicans NOT DETECTED NOT DETECTED Final   Candida glabrata NOT DETECTED NOT DETECTED Final   Candida krusei NOT DETECTED NOT DETECTED Final   Candida parapsilosis NOT DETECTED NOT DETECTED Final   Candida tropicalis NOT DETECTED NOT DETECTED Final    Comment: Performed at [facility], [address] [place], [place] [address]  Acid Fast Smear (AFB)     Status: None   Collection Time: 06/27/19 11:45 AM   Specimen: Abdomen; Peritoneal Fluid  Result Value Ref Range Status   AFB Specimen  Processing Comment  Final    Comment: Tissue Grinding and Digestion/Decontamination   Acid Fast Smear Negative  Final    Comment: (NOTE) Performed At: [facility] [address] [place], [place] [id number] Jolene Schimke MD AO:[id number]    Source (AFB) ABDOMEN  Final    Comment: Performed at [facility], [address], [place], [place] [address]  Culture, body fluid-bottle     Status: None (Preliminary result)   Collection Time: 06/27/19 11:45 AM   Specimen: Abdomen  Result Value Ref Range Status   Specimen Description ABDOMEN  Final   Special Requests NONE  Final   Culture   Final    NO GROWTH 4 DAYS Performed at [facility], [address] [place], [place] [address]    Report Status PENDING  Incomplete  Gram stain     Status: None   Collection Time: 06/27/19 11:45 AM   Specimen: Abdomen  Result Value Ref Range Status   Specimen Description ABDOMEN  Final   Special Requests NONE  Final   Gram Stain   Final    RARE WBC PRESENT,BOTH PMN AND MONONUCLEAR NO ORGANISMS SEEN Performed at [facility], [address] [place], [place] [address]    Report Status 06/27/2019 FINAL  Final  Anaerobic culture     Status: None (Preliminary result)   Collection Time: 06/27/19  4:00 PM   Specimen: PATH Cytology CSF; Cerebrospinal Fluid  Result Value Ref Range Status   Specimen Description   Final    CSF Performed at [facility], [address] [place], [place] [address]    Special Requests   Final    NONE Performed at [facility], [address], [place], [place] [address]    Culture   Final    NO ANAEROBES ISOLATED; CULTURE IN PROGRESS FOR 5 DAYS   Report Status PENDING  Incomplete  CSF culture     Status: None   Collection Time: 06/27/19  4:00 PM   Specimen: PATH Cytology CSF; Cerebrospinal Fluid  Result Value Ref Range Status   Specimen Description   Final    CSF Performed at [facility], [address] [place], [place] [address]    Special Requests   Final    NONE Performed at [facility], [address] [place], [place] [address]    Gram Stain   Final    WBC PRESENT,BOTH PMN AND MONONUCLEAR NO  ORGANISMS SEEN CYTOSPIN SMEAR    Culture   Final    NO GROWTH 3 DAYS Performed at [facility], [address] [place], [place] [address]    Report Status 06/30/2019 FINAL  Final  Culture, fungus without smear     Status: None (Preliminary result)   Collection Time: 06/27/19  4:00 PM   Specimen: PATH Cytology CSF; Cerebrospinal Fluid  Result Value Ref Range Status   Specimen Description   Final    CSF Performed at [facility] [address] [place], [place] [address]    Special Requests   Final    NONE Performed at [facility], [address], [place], [place] [address]    Culture   Final    NO FUNGUS ISOLATED AFTER 3 DAYS Performed at [facility], [address], [place], [place] [address]    Report Status PENDING  Incomplete         Radiology Studies: Mr Laqueta Jean QI Contrast  Result Date: 06/29/2019 CLINICAL DATA:  Encephalopathy.  History of breast cancer. EXAM: MRI HEAD WITHOUT AND WITH CONTRAST TECHNIQUE: Multiplanar, multiecho pulse sequences of the brain and surrounding structures were obtained without and with intravenous contrast. CONTRAST:  7mL GADAVIST GADOBUTROL 1 MMOL/ML IV SOLN COMPARISON:  Head CT 06/27/2019 FINDINGS: Brain: There is no evidence of acute infarct, intracranial hemorrhage, mass, midline shift, or extra-axial fluid collection. The ventricles and sulci are within normal limits for age. No significant cerebral white matter disease is evident. No abnormal enhancement is identified. There is a mildly expanded partially empty sella. Vascular: Major intracranial vascular flow voids are preserved. Skull and upper cervical spine: Unremarkable bone  marrow signal. Sinuses/Orbits: Unremarkable orbits. Completely opacified and mildly expanded left maxillary sinus. Other paranasal sinuses and mastoid air cells are clear. Other: None. IMPRESSION: 1. No evidence of acute intracranial abnormality or intracranial metastases. 2. Partially empty sella. Electronically Signed   By: Sebastian Ache M.D.   On: 06/29/2019 14:45   Dg Chest Port 1 View  Result Date: 07/01/2019 CLINICAL DATA:  Dyspnea EXAM: PORTABLE CHEST 1 VIEW COMPARISON:  06/20/2019 FINDINGS: Moderate right and small left pleural effusions. No frank interstitial edema. No pneumothorax. The heart is top-normal in size. Right chest port terminates the cavoatrial junction. IMPRESSION: Moderate right and small left pleural effusions. No frank interstitial edema. Electronically Signed   By: Charline Bills M.D.   On: 07/01/2019 08:05        Scheduled Meds:  amLODipine  5 mg Oral Daily   chlorhexidine  15 mL Mouth Rinse BID   Chlorhexidine Gluconate Cloth  6 each Topical Daily   famotidine  20 mg Oral Daily   feeding supplement  1 Container Oral Q24H   feeding supplement (ENSURE ENLIVE)  237 mL Oral Q24H   feeding supplement (PRO-STAT SUGAR FREE 64)  30 mL Oral Daily   gabapentin  100 mg Oral TID   mouth rinse  15 mL Mouth Rinse q12n4p   mirtazapine  7.5 mg Oral QHS   multivitamin with minerals  1 tablet Oral Daily   QUEtiapine  25 mg Oral Daily   rivaroxaban  20 mg Oral Q supper   sodium chloride flush  10-40 mL Intracatheter Q12H   Continuous Infusions:  sodium chloride Stopped (07/01/19 0359)     LOS: 11 days   Time spent= 25 mins    Gurshaan Matsuoka Joline Maxcy, MD [facility]  If 7PM-7AM, please contact night-coverage [email or web address]  07/01/2019, 11:16 AM

## 2019-07-02 LAB — BASIC METABOLIC PANEL
Anion gap: 6 (ref 5–15)
BUN: 19 mg/dL (ref 6–20)
CO2: 19 mmol/L — ABNORMAL LOW (ref 22–32)
Calcium: 8.9 mg/dL (ref 8.9–10.3)
Chloride: 115 mmol/L — ABNORMAL HIGH (ref 98–111)
Creatinine, Ser: 0.72 mg/dL (ref 0.44–1.00)
GFR calc Af Amer: >60 mL/min (ref >60)
GFR calc non Af Amer: >60 mL/min (ref >60)
Glucose, Bld: 105 mg/dL — ABNORMAL HIGH (ref 70–99)
Potassium: 4.2 mmol/L (ref 3.5–5.1)
Sodium: 140 mmol/L (ref 135–145)

## 2019-07-02 LAB — CULTURE, BODY FLUID W GRAM STAIN -BOTTLE: Culture: NO GROWTH

## 2019-07-02 LAB — GLUCOSE, CAPILLARY
Glucose-Capillary: 106 mg/dL — ABNORMAL HIGH (ref 70–99)
Glucose-Capillary: 94 mg/dL (ref 70–99)
Glucose-Capillary: 84 mg/dL (ref 70–99)
Glucose-Capillary: 95 mg/dL (ref 70–99)
Glucose-Capillary: 96 mg/dL (ref 70–99)
Glucose-Capillary: 88 mg/dL (ref 70–99)

## 2019-07-02 LAB — MAGNESIUM: Magnesium: 1.8 mg/dL (ref 1.7–2.4)

## 2019-07-02 LAB — ANAEROBIC CULTURE

## 2019-07-02 LAB — CBC
HCT: 28 % — ABNORMAL LOW (ref 36.0–46.0)
Hemoglobin: 8.6 g/dL — ABNORMAL LOW (ref 12.0–15.0)
MCH: 27.1 pg (ref 26.0–34.0)
MCHC: 30.7 g/dL (ref 30.0–36.0)
MCV: 88.3 fL (ref 80.0–100.0)
Platelets: 593 10*3/uL — ABNORMAL HIGH (ref 150–400)
RBC: 3.17 MIL/uL — ABNORMAL LOW (ref 3.87–5.11)
RDW: 19.9 % — ABNORMAL HIGH (ref 11.5–15.5)
WBC: 9.6 10*3/uL (ref 4.0–10.5)
nRBC: 0 % (ref 0.0–0.2)

## 2019-07-02 MED ORDER — METOPROLOL TARTRATE 25 MG PO TABS
50.0000 mg | ORAL_TABLET | Freq: Two times a day (BID) | ORAL | Status: DC
  Administered 2019-07-02 – 2019-07-05 (×6): 50 mg via ORAL
  Filled 2019-07-02 (×6): qty 2

## 2019-07-02 MED ORDER — METOPROLOL TARTRATE 25 MG PO TABS
25.0000 mg | ORAL_TABLET | Freq: Once | ORAL | Status: AC
  Administered 2019-07-02: 25 mg via ORAL
  Filled 2019-07-02: qty 1

## 2019-07-02 MED ORDER — POLYETHYLENE GLYCOL 3350 17 G PO PACK: 17.0000 g | PACK | Freq: Every day | ORAL | Status: DC | PRN

## 2019-07-02 MED ORDER — SENNOSIDES-DOCUSATE SODIUM 8.6-50 MG PO TABS
2.0000 | ORAL_TABLET | Freq: Every evening | ORAL | Status: DC | PRN
  Administered 2019-07-03: 2 via ORAL
  Filled 2019-07-02: qty 2

## 2019-07-02 NOTE — Progress Notes (Addendum)
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  Initially diagnosed with E. coli UTI treated with Cipro but due to persistent fevers without any other clear source was started on vancomycin and cefepime for presumed sepsis.  By infectious disease.   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  Drug-related versus malignancy. -Status post 5 days vancomycin/cefepime.  Suspect drug-related fevers - Lumbar puncture- no infection per preliminary results -Seen by infectious disease.  Now signed off  Moderate bilateral effusions - Third spacing.  Advised mobility. -Bronchodilators PRN, incentive spirometer and flutter.  Sinus tachycardia; persistent -She is mostly third spacing her fluids - Increase metoprolol to 50 mg twice daily  Small bowel obstruction secondary to peritoneal carcinomatosis/metastatic disease -Conservative management.  Seen by general surgery  Intermittent delirium with paranoia -CT head negative.  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  Breast cancer with diffuse  metastasis -Thrombocytopenia oncology status post recent chemotherapy.  Seen by palliative care-opting for less aggressive measures. -Received a dose of carboplatin - Will defer to oncology for long-term plans.  Appreciate input from palliative care team.  Eventually follow-up with Dr Wetzel Bjornstad  Patient is DNR -MRI head- negative - Cardiology following.  History of pulmonary embolism - On Xarelto.  Cauda equina syndrome With neurogenic bladder -Secondary to spinal metastasis.  Foley replaced  Acute on chronic anemia -Monitor hemoglobin.  Malignant ascites - Status post paracentesis 10/8.  Stage II decubitus ulcer  Very poor prognosis secondary to advanced metastases.  DVT prophylaxis: Xarelto Code Status: DNI but okay with CPR. Family Communication: Multiple attempts to call her husband but no response. Disposition Plan: Maintain hospital stay until sinus tachycardia is improved then will transition her home with home health  Addendum 12:30 PM-patient's husband Kaylee Randolph updated.  Spoke with him extensively over the phone.  Consultants:   Ortho  Infectious disease  Palliative care   Subjective: No complaints.  Heart rate remains in 130s range..  Review of Systems Otherwise negative except as per HPI, including: General = no fevers, chills, dizziness, malaise, fatigue HEENT/EYES = negative for pain, redness, loss of vision, double vision, blurred vision, loss of hearing, sore throat, hoarseness, dysphagia Cardiovascular= negative for chest pain, palpitation, murmurs, lower extremity swelling Respiratory/lungs= negative for shortness of breath, cough, hemoptysis, wheezing, mucus production Gastrointestinal= negative for nausea, vomiting,, abdominal pain, melena, hematemesis Genitourinary= negative for Dysuria, Hematuria, Change in Urinary Frequency MSK = Negative for arthralgia, myalgias, Back Pain, Joint swelling  Neurology= Negative for headache, seizures, numbness,  tingling  Psychiatry= Negative for anxiety, depression, suicidal and homocidal ideation Allergy/Immunology= Medication/Food allergy as listed  Skin= Negative for Rash, lesions, ulcers, itching   Objective: Vitals:  07/02/19 0333 07/02/19 0400 07/02/19 0600 07/02/19 0824  BP:  (!) 141/94 (!) 142/90   Pulse:  (!) 115    Resp:  20 (!) 26   Temp: 98.1 F (36.7 C)   98.9 F (37.2 C)  TempSrc: Oral   Oral  SpO2:  95%    Weight:      Height:        Intake/Output Summary (Last 24 hours) at 07/02/2019 1116 Last data filed at 07/01/2019 2247 Gross per 24 hour  Intake 10 ml  Output 800 ml  Net -790 ml   Filed Weights   06/20/19 2014  Weight: 63.5 kg    Examination:  Constitutional: NAD, calm, comfortable Eyes: PERRL, lids and conjunctivae normal ENMT: Mucous membranes are moist. Posterior pharynx clear of any exudate or lesions.Normal dentition.  Neck: normal, supple, no masses, no thyromegaly Respiratory: clear to auscultation bilaterally, no wheezing, no crackles. Normal respiratory effort. No accessory muscle use.  Cardiovascular: Sinus tachycardia, regular rate and rhythm, no murmurs / rubs / gallops.  1+ bilateral lower extremity pitting edema. 2+ pedal pulses. No carotid bruits.  Abdomen: no tenderness, no masses palpated. No hepatosplenomegaly. Bowel sounds positive.  Musculoskeletal: no clubbing / cyanosis. No joint deformity upper and lower extremities. Good ROM, no contractures. Normal muscle tone.  Skin: no rashes, lesions, ulcers. No induration Neurologic: CN 2-12 grossly intact. Sensation intact, DTR normal. Strength 4/5 in all 4.  Psychiatric: Normal judgment and insight. Alert and oriented x 3. Normal mood.    Chest wall port in place  Data Reviewed:   CBC: Recent Labs  Lab 06/27/19 0157 06/29/19 0930 06/30/19 0545 07/01/19 0353 07/02/19 0450  WBC 10.6* 12.6* 11.2* 9.3 9.6  NEUTROABS  --   --  9.5*  --   --   HGB 8.8* 9.0* 8.5* 8.5* 8.6*  HCT 29.1*  28.5* 27.5* 27.3* 28.0*  MCV 89.5 87.2 87.3 87.2 88.3  PLT 418* 497* 538* 556* 0000000*   Basic Metabolic Panel: Recent Labs  Lab 06/28/19 1408 06/29/19 0930 06/30/19 0545 07/01/19 0353 07/02/19 0450  NA 137 137 138 140 140  K 4.5 5.4* 4.8 4.2 4.2  CL 113* 114* 113* 115* 115*  CO2 17* 16* 17* 17* 19*  GLUCOSE 93 132* 93 88 105*  BUN 14 16 19  21* 19  CREATININE 0.62 0.70 0.70 0.77 0.72  CALCIUM 8.5* 8.0* 8.6* 8.8* 8.9  MG 1.7  --   --  1.8 1.8   GFR: Estimated Creatinine Clearance: 70.2 mL/min (by C-G formula based on SCr of 0.72 mg/dL). Liver Function Tests: Recent Labs  Lab 06/30/19 0545  AST 14*  ALT 12  ALKPHOS 84  BILITOT 0.6  PROT 5.1*  ALBUMIN 1.8*   No results for input(s): LIPASE, AMYLASE in the last 168 hours. No results for input(s): AMMONIA in the last 168 hours. Coagulation Profile: Recent Labs  Lab 06/27/19 0856  INR 1.2   Cardiac Enzymes: No results for input(s): CKTOTAL, CKMB, CKMBINDEX, TROPONINI in the last 168 hours. BNP (last 3 results) No results for input(s): PROBNP in the last 8760 hours. HbA1C: No results for input(s): HGBA1C in the last 72 hours. CBG: Recent Labs  Lab 07/01/19 1605 07/01/19 1939 07/01/19 2313 07/02/19 0308 07/02/19 0747  GLUCAP 112* 105* 107* 106* 94   Lipid Profile: No results for input(s): CHOL, HDL, LDLCALC, TRIG, CHOLHDL, LDLDIRECT in the last 72 hours. Thyroid Function Tests: No results for input(s): TSH, T4TOTAL, FREET4, T3FREE, THYROIDAB in the last 72  hours. Anemia Panel: No results for input(s): VITAMINB12, FOLATE, FERRITIN, TIBC, IRON, RETICCTPCT in the last 72 hours. Sepsis Labs: Recent Labs  Lab 06/25/19 1319 06/26/19 0504 06/27/19 0157  PROCALCITON 1.16 <0.10 2.16    Recent Results (from the past 240 hour(s))  Culture, blood (Routine X 2) w Reflex to ID Panel     Status: None   Collection Time: 06/26/19  1:40 PM   Specimen: BLOOD RIGHT HAND  Result Value Ref Range Status   Specimen  Description   Final    BLOOD RIGHT HAND Performed at [facility], [place] [address], [place], [place] [address]    Special Requests   Final    BOTTLES DRAWN AEROBIC ONLY Blood Culture results may not be optimal due to an inadequate volume of blood received in culture bottles Performed at [place] [address] [place], [place] [address]    Culture   Final    NO GROWTH 5 DAYS Performed at [facility], [place] [address], [place], [place] [address]    Report Status 07/01/2019 FINAL  Final  Culture, blood (Routine X 2) w Reflex to ID Panel     Status: Abnormal   Collection Time: 06/26/19  1:40 PM   Specimen: BLOOD  Result Value Ref Range Status   Specimen Description   Final    BLOOD LEFT ANTECUBITAL Performed at [place] [address], [place], [place] [address]    Special Requests   Final    BOTTLES DRAWN AEROBIC ONLY Blood Culture results may not be optimal due to an inadequate volume of blood received in culture bottles Performed at [place] [address] [place], [place] [address]    Culture  Setup Time   Final    AEROBIC BOTTLE ONLY GRAM POSITIVE COCCI CRITICAL RESULT CALLED TO, READ BACK BY AND VERIFIED WITH: PHARMD [id number] AT 47 AM BY CM    Culture (A)  Final    STAPHYLOCOCCUS SPECIES (COAGULASE NEGATIVE) THE SIGNIFICANCE OF ISOLATING THIS ORGANISM FROM A SINGLE SET OF BLOOD CULTURES WHEN MULTIPLE SETS ARE DRAWN IS UNCERTAIN. PLEASE NOTIFY THE MICROBIOLOGY DEPARTMENT WITHIN ONE WEEK IF SPECIATION AND SENSITIVITIES ARE REQUIRED. Performed at [facility], [place] [address], [place], [place] [address]    Report Status 06/29/2019 FINAL  Final  Blood Culture ID Panel (Reflexed)     Status: Abnormal   Collection Time: 06/26/19  1:40 PM  Result Value Ref Range Status   Enterococcus species NOT DETECTED NOT DETECTED Final   Listeria monocytogenes NOT DETECTED NOT DETECTED Final    Staphylococcus species DETECTED (A) NOT DETECTED Final    Comment: Methicillin (oxacillin) resistant coagulase negative staphylococcus. Possible blood culture contaminant (unless isolated from more than one blood culture draw or clinical case suggests pathogenicity). No antibiotic treatment is indicated for blood  culture contaminants. CRITICAL RESULT CALLED TO, READ BACK BY AND VERIFIED WITH: PHARMD [id number] AT 63 AM BY CM    Staphylococcus aureus (BCID) NOT DETECTED NOT DETECTED Final   Methicillin resistance DETECTED (A) NOT DETECTED Final    Comment: CRITICAL RESULT CALLED TO, READ BACK BY AND VERIFIED WITH: PHARMD T GREEN [id number] AT 90 BY CM    Streptococcus species NOT DETECTED NOT DETECTED Final   Streptococcus agalactiae NOT DETECTED NOT DETECTED Final   Streptococcus pneumoniae NOT DETECTED NOT DETECTED Final   Streptococcus pyogenes NOT DETECTED NOT DETECTED Final   Acinetobacter baumannii NOT DETECTED NOT DETECTED  Final   Enterobacteriaceae species NOT DETECTED NOT DETECTED Final   Enterobacter cloacae complex NOT DETECTED NOT DETECTED Final   Escherichia coli NOT DETECTED NOT DETECTED Final   Klebsiella oxytoca NOT DETECTED NOT DETECTED Final   Klebsiella pneumoniae NOT DETECTED NOT DETECTED Final   Proteus species NOT DETECTED NOT DETECTED Final   Serratia marcescens NOT DETECTED NOT DETECTED Final   Haemophilus influenzae NOT DETECTED NOT DETECTED Final   Neisseria meningitidis NOT DETECTED NOT DETECTED Final   Pseudomonas aeruginosa NOT DETECTED NOT DETECTED Final   Candida albicans NOT DETECTED NOT DETECTED Final   Candida glabrata NOT DETECTED NOT DETECTED Final   Candida krusei NOT DETECTED NOT DETECTED Final   Candida parapsilosis NOT DETECTED NOT DETECTED Final   Candida tropicalis NOT DETECTED NOT DETECTED Final    Comment: Performed at [facility], [place] [address], [place], [place] [address]  Fungus Culture With Stain     Status: None (Preliminary result)    Collection Time: 06/27/19 11:45 AM   Specimen: Abdomen; Peritoneal Fluid  Result Value Ref Range Status   Fungus Stain Final report  Final    Comment: (NOTE) Performed At: [facility] [place], [place] [id number] Rush Farmer MD [id number]    Fungus (Mycology) Culture PENDING  Incomplete   Fungal Source ABDOMEN  Final    Comment: Performed at [facility], [place] [address] [place], [place] [address]  Acid Fast Smear (AFB)     Status: None   Collection Time: 06/27/19 11:45 AM   Specimen: Abdomen; Peritoneal Fluid  Result Value Ref Range Status   AFB Specimen Processing Comment  Final    Comment: Tissue Grinding and Digestion/Decontamination   Acid Fast Smear Negative  Final    Comment: (NOTE) Performed At: [facility] [id number] [place], [place] [id number] Rush Farmer MD [id number]    Source (AFB) ABDOMEN  Final    Comment: Performed at [facility], [place] [address] [place], [place] [address]  Culture, body fluid-bottle     Status: None (Preliminary result)   Collection Time: 06/27/19 11:45 AM   Specimen: Abdomen  Result Value Ref Range Status   Specimen Description ABDOMEN  Final   Special Requests NONE  Final   Culture   Final    NO GROWTH 4 DAYS Performed at [facility], [address], [place], [place] [address]    Report Status PENDING  Incomplete  Gram stain     Status: None   Collection Time: 06/27/19 11:45 AM   Specimen: Abdomen  Result Value Ref Range Status   Specimen Description ABDOMEN  Final   Special Requests NONE  Final   Gram Stain   Final    RARE WBC PRESENT,BOTH PMN AND MONONUCLEAR NO ORGANISMS SEEN Performed at [facility], [address] [place], [place] [address]    Report Status 06/27/2019 FINAL  Final  Fungus Culture Result     Status: None   Collection Time: 06/27/19 11:45 AM  Result Value Ref Range Status   Result 1 Comment  Final    Comment:  (NOTE) KOH/Calcofluor preparation:  no fungus observed. Performed At: [facility] [place], [place] [id number] Rush Farmer MD [id number]   Anaerobic culture     Status: None (Preliminary result)   Collection Time: 06/27/19  4:00 PM   Specimen: PATH Cytology CSF; Cerebrospinal Fluid  Result Value Ref Range Status   Specimen Description  Final    CSF Performed at [facility] [address], [place], [place] [address]    Special Requests   Final    NONE Performed at [facility], [place] [address], [place], [place] [address]    Culture   Final    NO ANAEROBES ISOLATED; CULTURE IN PROGRESS FOR 5 DAYS   Report Status PENDING  Incomplete  CSF culture     Status: None   Collection Time: 06/27/19  4:00 PM   Specimen: PATH Cytology CSF; Cerebrospinal Fluid  Result Value Ref Range Status   Specimen Description   Final    CSF Performed at [place] [address], [place], [place] [address]    Special Requests   Final    NONE Performed at [facility], [place] [address], [place], [place] [address]    Gram Stain   Final    WBC PRESENT,BOTH PMN AND MONONUCLEAR NO ORGANISMS SEEN CYTOSPIN SMEAR    Culture   Final    NO GROWTH 3 DAYS Performed at [facility], [place] [address], [place], [place] [address]    Report Status 06/30/2019 FINAL  Final  Culture, fungus without smear     Status: None (Preliminary result)   Collection Time: 06/27/19  4:00 PM   Specimen: PATH Cytology CSF; Cerebrospinal Fluid  Result Value Ref Range Status   Specimen Description   Final    CSF Performed at [place] [address], [place], [place] [address]    Special Requests   Final    NONE Performed at [facility], [place] [address] [place], [place] [address]    Culture   Final    No Fungi Isolated in 4 Weeks Performed at [facility], [place] [address] [place],  [address]    Report Status PENDING  Incomplete         Radiology Studies: Dg Chest Port 1 View  Result Date: 07/01/2019 CLINICAL DATA:  Dyspnea EXAM: PORTABLE CHEST 1 VIEW COMPARISON:  06/20/2019 FINDINGS: Moderate right and small left pleural effusions. No frank interstitial edema. No pneumothorax. The heart is top-normal in size. Right chest port terminates the cavoatrial junction. IMPRESSION: Moderate right and small left pleural effusions. No frank interstitial edema. Electronically Signed   By: Julian Hy M.D.   On: 07/01/2019 08:05        Scheduled Meds: . amLODipine  5 mg Oral Daily  . chlorhexidine  15 mL Mouth Rinse BID  . Chlorhexidine Gluconate Cloth  6 each Topical Daily  . famotidine  20 mg Oral Daily  . feeding supplement  1 Container Oral Q24H  . feeding supplement (ENSURE ENLIVE)  237 mL Oral Q24H  . feeding supplement (PRO-STAT SUGAR FREE 64)  30 mL Oral Daily  . gabapentin  100 mg Oral TID  . mouth rinse  15 mL Mouth Rinse q12n4p  . metoprolol tartrate  25 mg Oral Once  . metoprolol tartrate  50 mg Oral BID  . mirtazapine  7.5 mg Oral QHS  . multivitamin with minerals  1 tablet Oral Daily  . QUEtiapine  25 mg Oral Daily  . rivaroxaban  20 mg Oral Q supper  . sodium chloride flush  10-40 mL Intracatheter Q12H   Continuous Infusions:    LOS: 12 days   Time spent= 35 mins    Ankit Arsenio Loader, MD [facility]  If 7PM-7AM, please contact night-coverage  [email or web address] 07/02/2019, 11:16 AM

## 2019-07-03 ENCOUNTER — Inpatient Hospital Stay (HOSPITAL_COMMUNITY): Payer: BC Managed Care – PPO

## 2019-07-03 ENCOUNTER — Encounter (HOSPITAL_COMMUNITY): Payer: Self-pay | Admitting: Radiology

## 2019-07-03 DIAGNOSIS — J90 Pleural effusion, not elsewhere classified: Principal | ICD-10-CM

## 2019-07-03 DIAGNOSIS — E43 Unspecified severe protein-calorie malnutrition: Secondary | ICD-10-CM

## 2019-07-03 DIAGNOSIS — R9431 Abnormal electrocardiogram [ECG] [EKG]: Secondary | ICD-10-CM

## 2019-07-03 LAB — CBC
HCT: 27.5 % — ABNORMAL LOW (ref 36.0–46.0)
Hemoglobin: 8.6 g/dL — ABNORMAL LOW (ref 12.0–15.0)
MCH: 27.7 pg (ref 26.0–34.0)
MCHC: 31.3 g/dL (ref 30.0–36.0)
MCV: 88.4 fL (ref 80.0–100.0)
Platelets: 552 10*3/uL — ABNORMAL HIGH (ref 150–400)
RBC: 3.11 MIL/uL — ABNORMAL LOW (ref 3.87–5.11)
RDW: 19.9 % — ABNORMAL HIGH (ref 11.5–15.5)
WBC: 10.1 10*3/uL (ref 4.0–10.5)
nRBC: 0 % (ref 0.0–0.2)

## 2019-07-03 LAB — BASIC METABOLIC PANEL
Anion gap: 7 (ref 5–15)
BUN: 19 mg/dL (ref 6–20)
CO2: 18 mmol/L — ABNORMAL LOW (ref 22–32)
Calcium: 8.7 mg/dL — ABNORMAL LOW (ref 8.9–10.3)
Chloride: 115 mmol/L — ABNORMAL HIGH (ref 98–111)
Creatinine, Ser: 0.7 mg/dL (ref 0.44–1.00)
GFR calc Af Amer: >60 mL/min (ref >60)
GFR calc non Af Amer: >60 mL/min (ref >60)
Glucose, Bld: 84 mg/dL (ref 70–99)
Potassium: 3.8 mmol/L (ref 3.5–5.1)
Sodium: 140 mmol/L (ref 135–145)

## 2019-07-03 LAB — BODY FLUID CELL COUNT WITH DIFFERENTIAL
Eos, Fluid: 0 %
Lymphs, Fluid: 33 %
Monocyte-Macrophage-Serous Fluid: 32 % — ABNORMAL LOW (ref 50–90)
Neutrophil Count, Fluid: 35 % — ABNORMAL HIGH (ref 0–25)
Total Nucleated Cell Count, Fluid: 156 cu mm (ref 0–1000)

## 2019-07-03 LAB — LACTATE DEHYDROGENASE, PLEURAL OR PERITONEAL FLUID: LD, Fluid: 177 U/L — ABNORMAL HIGH (ref 3–23)

## 2019-07-03 LAB — GLUCOSE, CAPILLARY
Glucose-Capillary: 114 mg/dL — ABNORMAL HIGH (ref 70–99)
Glucose-Capillary: 53 mg/dL — ABNORMAL LOW (ref 70–99)
Glucose-Capillary: 77 mg/dL (ref 70–99)
Glucose-Capillary: 80 mg/dL (ref 70–99)
Glucose-Capillary: 83 mg/dL (ref 70–99)
Glucose-Capillary: 97 mg/dL (ref 70–99)

## 2019-07-03 LAB — MAGNESIUM: Magnesium: 1.7 mg/dL (ref 1.7–2.4)

## 2019-07-03 MED ORDER — IOHEXOL 350 MG/ML SOLN
100.0000 mL | Freq: Once | INTRAVENOUS | Status: AC | PRN
  Administered 2019-07-03: 100 mL via INTRAVENOUS

## 2019-07-03 MED ORDER — SODIUM CHLORIDE (PF) 0.9 % IJ SOLN
INTRAMUSCULAR | Status: AC
  Filled 2019-07-03: qty 50

## 2019-07-03 MED ORDER — FUROSEMIDE 10 MG/ML IJ SOLN
40.0000 mg | Freq: Two times a day (BID) | INTRAMUSCULAR | Status: DC
  Administered 2019-07-03 – 2019-07-05 (×4): 40 mg via INTRAVENOUS
  Filled 2019-07-03 (×4): qty 4

## 2019-07-03 MED ORDER — POTASSIUM CHLORIDE CRYS ER 20 MEQ PO TBCR
40.0000 meq | EXTENDED_RELEASE_TABLET | Freq: Once | ORAL | Status: AC
  Administered 2019-07-03: 40 meq via ORAL
  Filled 2019-07-03: qty 2

## 2019-07-03 MED ORDER — DEXTROSE 50 % IV SOLN: 12.5000 g | INTRAVENOUS | Status: AC

## 2019-07-03 MED ORDER — PERFLUTREN LIPID MICROSPHERE
1.0000 mL | INTRAVENOUS | Status: AC | PRN
  Administered 2019-07-03: 3 mL via INTRAVENOUS
  Filled 2019-07-03: qty 10

## 2019-07-03 MED ORDER — ALTEPLASE 2 MG IJ SOLR
2.0000 mg | Freq: Once | INTRAMUSCULAR | Status: AC
  Administered 2019-07-03: 2 mg
  Filled 2019-07-03: qty 2

## 2019-07-03 MED ORDER — FUROSEMIDE 10 MG/ML IJ SOLN
80.0000 mg | Freq: Once | INTRAMUSCULAR | Status: AC
  Administered 2019-07-03: 80 mg via INTRAVENOUS
  Filled 2019-07-03: qty 8

## 2019-07-03 MED ORDER — MAGNESIUM OXIDE 400 (241.3 MG) MG PO TABS
800.0000 mg | ORAL_TABLET | Freq: Once | ORAL | Status: AC
  Administered 2019-07-03: 800 mg via ORAL
  Filled 2019-07-03: qty 2

## 2019-07-03 MED ORDER — LIDOCAINE HCL 1 % IJ SOLN
INTRAMUSCULAR | Status: AC
  Filled 2019-07-03: qty 10

## 2019-07-03 NOTE — Progress Notes (Signed)
Brief oncology note:  The patient was off the floor when I came to see her today. Our office has spoken with Dr. Wetzel Bjornstad office regarding potential discharge tomorrow.  Dr. Wetzel Bjornstad office will reach out to the patient's husband to arrange for outpatient follow-up at [facility] for further discussion regarding chemotherapy.  I spoke with the patient's husband by telephone and informed him that he should be receiving information regarding a follow-up appointment at [facility].  He was thankful for the call.  Mikey Bussing, DNP, AGPCNP-BC, AOCNP

## 2019-07-03 NOTE — Procedures (Signed)
Ultrasound-guided diagnostic and therapeutic right thoracentesis performed yielding 1.2 liters of hazy,amber fluid. No immediate complications. Follow-up chest x-ray pending. A portion of the fluid was sent to the lab for preordered studies. EBL none.

## 2019-07-03 NOTE — Progress Notes (Signed)
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  Initially diagnosed with E. coli UTI treated with Cipro but due to persistent fevers without any other clear source was started on vancomycin and cefepime for presumed sepsis.  By infectious disease.   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  Drug-related versus malignancy. -Pleated 5-day treatment with vancomycin and cefepime. - Lumbar puncture- no infection per preliminary results -Seen by infectious disease.  Now signed off  Moderate bilateral effusions, progressing - IV fluids have been stopped.  IV Lasix -IR consulted for thoracentesis.  We will send fluid for cell count and Gram stain.  Sinus tachycardia; persistent -She is mostly third spacing her fluids - Continue metoprolol 50 mg twice daily  Small bowel obstruction secondary to peritoneal carcinomatosis/metastatic disease -Conservative management, seen by general surgery.  Intermittent delirium with paranoia -CT head negative.   - CSF sent for cytology - Low-dose Seroquel - Tizanidine PRN.  Breast cancer with diffuse  metastasis -Thrombocytopenia oncology status post recent chemotherapy.  Seen by palliative care-opting for less aggressive measures. -Received a dose of carboplatin - Will defer to oncology for long-term plans.  Appreciate input from palliative care team.  Eventually follow-up with Dr Caswell Corwin  Patient is DNR -MRI head- negative - Cardiology following.  History of pulmonary embolism - On Xarelto.  Cauda equina syndrome With neurogenic bladder -Secondary to spinal metastasis.  Foley replaced  Acute on chronic anemia -Monitor hemoglobin.  Malignant ascites - Status post paracentesis 10/8.  Stage II decubitus ulcer  Significant amount of protein calorie malnutrition, severe -Due to nausea she has poor oral intake.  Discussed with the patient and the husband regarding adding TPN.  Also discussed risks and benefits of TPN, he will discuss it with the patient and get back to me. -Dietitian following.  Appreciate her input.  Very poor prognosis secondary to advanced metastases.  DVT prophylaxis: Xarelto Code Status: DNI but okay with CPR. Family Communication: Husband updated  disposition Plan: During hospital stay for management of sinus tachycardia and bilateral effusions  Consultants:   Ortho  Infectious disease  Palliative care  Dementia radiology   Subjective: Still remains in sinus tachycardia. Dyspnea noted this morning especially when laying flat.  Review of Systems Otherwise negative except as per HPI, including: General = no fevers, chills, dizziness, malaise, fatigue HEENT/EYES = negative for pain, redness, loss of vision, double vision, blurred vision, loss of hearing, sore throat, hoarseness, dysphagia Cardiovascular= negative for chest pain, palpitation, murmurs, lower extremity swelling Respiratory/lungs= negative for shortness of breath, cough, hemoptysis, wheezing, mucus production Gastrointestinal= negative for nausea, vomiting,, abdominal pain,  melena, hematemesis Genitourinary= negative for Dysuria, Hematuria, Change in Urinary Frequency MSK = Negative for arthralgia, myalgias,  Back Pain, Joint swelling  Neurology= Negative for headache, seizures, numbness, tingling  Psychiatry= Negative for anxiety, depression, suicidal and homocidal ideation Allergy/Immunology= Medication/Food allergy as listed  Skin= Negative for Rash, lesions, ulcers, itching  Objective: Vitals:   07/03/19 0200 07/03/19 0400 07/03/19 0600 07/03/19 0800  BP: 135/82 125/74 (!) 146/95 (!) 155/101  Pulse:      Resp: 15 16 (!) 23 (!) 24  Temp:  99.1 F (37.3 C)  98.5 F (36.9 C)  TempSrc:  Oral    SpO2:  94%    Weight:      Height:        Intake/Output Summary (Last 24 hours) at 07/03/2019 1155 Last data filed at 07/03/2019 0600 Gross per 24 hour  Intake 120 ml  Output 1200 ml  Net -1080 ml   Filed Weights   06/20/19 2014  Weight: 63.5 kg    Examination:  Constitutional: NAD, calm, comfortable Eyes: PERRL, lids and conjunctivae normal ENMT: Mucous membranes are moist. Posterior pharynx clear of any exudate or lesions.Normal dentition.  Neck: normal, supple, no masses, no thyromegaly Respiratory: Diminished breath sounds Cardiovascular: Regular rate and rhythm, no murmurs / rubs / gallops.  2+ bilateral lower extremity pitting edema. 2+ pedal pulses. No carotid bruits.  Abdomen: no tenderness, no masses palpated. No hepatosplenomegaly. Bowel sounds positive.  Musculoskeletal: no clubbing / cyanosis. No joint deformity upper and lower extremities. Good ROM, no contractures. Normal muscle tone.  Skin: no rashes, lesions, ulcers. No induration Neurologic: CN 2-12 grossly intact. Sensation intact, DTR normal. Strength 4/5 in all 4.  Psychiatric: Normal judgment and insight. Alert and oriented x 3. Normal mood.   Chest wall port in place  Data Reviewed:   CBC: Recent Labs  Lab 06/29/19 0930 06/30/19 0545 07/01/19 0353 07/02/19 0450  07/03/19 0600  WBC 12.6* 11.2* 9.3 9.6 10.1  NEUTROABS  --  9.5*  --   --   --   HGB 9.0* 8.5* 8.5* 8.6* 8.6*  HCT 28.5* 27.5* 27.3* 28.0* 27.5*  MCV 87.2 87.3 87.2 88.3 88.4  PLT 497* 538* 556* 593* 552*   Basic Metabolic Panel: Recent Labs  Lab 06/28/19 1408 06/29/19 0930 06/30/19 0545 07/01/19 0353 07/02/19 0450 07/03/19 0600  NA 137 137 138 140 140 140  K 4.5 5.4* 4.8 4.2 4.2 3.8  CL 113* 114* 113* 115* 115* 115*  CO2 17* 16* 17* 17* 19* 18*  GLUCOSE 93 132* 93 88 105* 84  BUN 14 16 19  21* 19 19  CREATININE 0.62 0.70 0.70 0.77 0.72 0.70  CALCIUM 8.5* 8.0* 8.6* 8.8* 8.9 8.7*  MG 1.7  --   --  1.8 1.8 1.7   GFR: Estimated Creatinine Clearance: 70.2 mL/min (by C-G formula based on SCr of 0.7 mg/dL). Liver Function Tests: Recent Labs  Lab 06/30/19 0545  AST 14*  ALT 12  ALKPHOS 84  BILITOT 0.6  PROT 5.1*  ALBUMIN 1.8*   No results for input(s): LIPASE, AMYLASE in the last 168 hours. No results for input(s): AMMONIA in the last 168 hours. Coagulation Profile: Recent Labs  Lab 06/27/19 0856  INR 1.2   Cardiac Enzymes: No results for input(s): CKTOTAL, CKMB, CKMBINDEX, TROPONINI in the last 168 hours. BNP (last 3 results) No results for input(s): PROBNP in the last 8760 hours. HbA1C: No results for input(s): HGBA1C in the last 72 hours. CBG: Recent Labs  Lab 07/02/19 1612 07/02/19 1935 07/02/19 2328 07/03/19 0323 07/03/19 0739  GLUCAP 95 96 88 80 77  Lipid Profile: No results for input(s): CHOL, HDL, LDLCALC, TRIG, CHOLHDL, LDLDIRECT in the last 72 hours. Thyroid Function Tests: No results for input(s): TSH, T4TOTAL, FREET4, T3FREE, THYROIDAB in the last 72 hours. Anemia Panel: No results for input(s): VITAMINB12, FOLATE, FERRITIN, TIBC, IRON, RETICCTPCT in the last 72 hours. Sepsis Labs: Recent Labs  Lab 06/27/19 0157  PROCALCITON 2.16    Recent Results (from the past 240 hour(s))  Culture, blood (Routine X 2) w Reflex to ID Panel      Status: None   Collection Time: 06/26/19  1:40 PM   Specimen: BLOOD RIGHT HAND  Result Value Ref Range Status   Specimen Description   Final    BLOOD RIGHT HAND Performed at [facility], [address] [place], [place] [address]    Special Requests   Final    BOTTLES DRAWN AEROBIC ONLY Blood Culture results may not be optimal due to an inadequate volume of blood received in culture bottles Performed at [facility], [address], [place], [place] [address]    Culture   Final    NO GROWTH 5 DAYS Performed at [facility], [address], [place], [place] [address]    Report Status 07/01/2019 FINAL  Final  Culture, blood (Routine X 2) w Reflex to ID Panel     Status: Abnormal   Collection Time: 06/26/19  1:40 PM   Specimen: BLOOD  Result Value Ref Range Status   Specimen Description   Final    BLOOD LEFT ANTECUBITAL Performed at [facility], [address], [place], [place] [address]    Special Requests   Final    BOTTLES DRAWN AEROBIC ONLY Blood Culture results may not be optimal due to an inadequate volume of blood received in culture bottles Performed at [facility], [address] [place], [place] [address]    Culture  Setup Time   Final    AEROBIC BOTTLE ONLY GRAM POSITIVE COCCI CRITICAL RESULT CALLED TO, READ BACK BY AND VERIFIED WITH: PHARMD [id number] AT 701 AM BY CM    Culture (A)  Final    STAPHYLOCOCCUS SPECIES (COAGULASE NEGATIVE) THE SIGNIFICANCE OF ISOLATING THIS ORGANISM FROM A SINGLE SET OF BLOOD CULTURES WHEN MULTIPLE SETS ARE DRAWN IS UNCERTAIN. PLEASE NOTIFY THE MICROBIOLOGY DEPARTMENT WITHIN ONE WEEK IF SPECIATION AND SENSITIVITIES ARE REQUIRED. Performed at [facility], [address] [place], [place] [address]    Report Status 06/29/2019 FINAL  Final  Blood Culture ID Panel (Reflexed)     Status: Abnormal   Collection Time: 06/26/19  1:40 PM  Result Value Ref Range  Status   Enterococcus species NOT DETECTED NOT DETECTED Final   Listeria monocytogenes NOT DETECTED NOT DETECTED Final   Staphylococcus species DETECTED (A) NOT DETECTED Final    Comment: Methicillin (oxacillin) resistant coagulase negative staphylococcus. Possible blood culture contaminant (unless isolated from more than one blood culture draw or clinical case suggests pathogenicity). No antibiotic treatment is indicated for blood  culture contaminants. CRITICAL RESULT CALLED TO, READ BACK BY AND VERIFIED WITH: PHARMD [id number] AT 703 AM BY CM    Staphylococcus aureus (BCID) NOT DETECTED NOT DETECTED Final   Methicillin resistance DETECTED (A) NOT DETECTED Final    Comment: CRITICAL RESULT CALLED TO, READ BACK BY AND VERIFIED WITH: PHARMD T GREEN [id number] AT 703 BY CM    Streptococcus species NOT DETECTED NOT DETECTED Final   Streptococcus agalactiae NOT DETECTED NOT DETECTED  Final   Streptococcus pneumoniae NOT DETECTED NOT DETECTED Final   Streptococcus pyogenes NOT DETECTED NOT DETECTED Final   Acinetobacter baumannii NOT DETECTED NOT DETECTED Final   Enterobacteriaceae species NOT DETECTED NOT DETECTED Final   Enterobacter cloacae complex NOT DETECTED NOT DETECTED Final   Escherichia coli NOT DETECTED NOT DETECTED Final   Klebsiella oxytoca NOT DETECTED NOT DETECTED Final   Klebsiella pneumoniae NOT DETECTED NOT DETECTED Final   Proteus species NOT DETECTED NOT DETECTED Final   Serratia marcescens NOT DETECTED NOT DETECTED Final   Haemophilus influenzae NOT DETECTED NOT DETECTED Final   Neisseria meningitidis NOT DETECTED NOT DETECTED Final   Pseudomonas aeruginosa NOT DETECTED NOT DETECTED Final   Candida albicans NOT DETECTED NOT DETECTED Final   Candida glabrata NOT DETECTED NOT DETECTED Final   Candida krusei NOT DETECTED NOT DETECTED Final   Candida parapsilosis NOT DETECTED NOT DETECTED Final   Candida tropicalis NOT DETECTED NOT DETECTED Final    Comment: Performed at [facility], [address] [place], [place] [address]  Fungus Culture With Stain     Status: None (Preliminary result)   Collection Time: 06/27/19 11:45 AM   Specimen: Abdomen; Peritoneal Fluid  Result Value Ref Range Status   Fungus Stain Final report  Final    Comment: (NOTE) Performed At: [facility] [address] [place], [place] [id number] Jolene Schimke MD [id number]    Fungus (Mycology) Culture PENDING  Incomplete   Fungal Source ABDOMEN  Final    Comment: Performed at [facility], [address] [place], [place] [address]  Acid Fast Smear (AFB)     Status: None   Collection Time: 06/27/19 11:45 AM   Specimen: Abdomen; Peritoneal Fluid  Result Value Ref Range Status   AFB Specimen Processing Comment  Final    Comment: Tissue Grinding and Digestion/Decontamination   Acid Fast Smear Negative  Final    Comment: (NOTE) Performed At: [facility] [address] [place], [place] [id number] Jolene Schimke MD [id number]    Source (AFB) ABDOMEN  Final    Comment: Performed at [facility], [address], [place], [place] [address]  Culture, body fluid-bottle     Status: None   Collection Time: 06/27/19 11:45 AM   Specimen: Abdomen  Result Value Ref Range Status   Specimen Description ABDOMEN  Final   Special Requests NONE  Final   Culture   Final    NO GROWTH 5 DAYS Performed at [facility], [address], [place], [place] [address]    Report Status 07/02/2019 FINAL  Final  Gram stain     Status: None   Collection Time: 06/27/19 11:45 AM   Specimen: Abdomen  Result Value Ref Range Status   Specimen Description ABDOMEN  Final   Special Requests NONE  Final   Gram Stain   Final    RARE WBC PRESENT,BOTH PMN AND MONONUCLEAR NO ORGANISMS SEEN Performed at [facility], [address] [place], [place] [address]    Report Status 06/27/2019 FINAL  Final  Fungus Culture Result     Status: None    Collection Time: 06/27/19 11:45 AM  Result Value Ref Range Status   Result 1 Comment  Final    Comment: (NOTE) KOH/Calcofluor preparation:  no fungus observed. Performed At: [facility] [address] [place], [place] [id number] Jolene Schimke MD YI:[id number]   Anaerobic culture     Status: None  Collection Time: 06/27/19  4:00 PM   Specimen: PATH Cytology CSF; Cerebrospinal Fluid  Result Value Ref Range Status   Specimen Description   Final    CSF Performed at [facility], [address] [place], [place] [address]    Special Requests   Final    NONE Performed at [facility], [address] [place], [place] [address]    Culture   Final    NO ANAEROBES ISOLATED Performed at [facility], [address], [place], [place] [address]    Report Status 07/02/2019 FINAL  Final  CSF culture     Status: None   Collection Time: 06/27/19  4:00 PM   Specimen: PATH Cytology CSF; Cerebrospinal Fluid  Result Value Ref Range Status   Specimen Description   Final    CSF Performed at [facility], [address] [place], [place] [address]    Special Requests   Final    NONE Performed at [facility], [address], [place], [place] [address]    Gram Stain   Final    WBC PRESENT,BOTH PMN AND MONONUCLEAR NO ORGANISMS SEEN CYTOSPIN SMEAR    Culture   Final    NO GROWTH 3 DAYS Performed at [facility], [address] [place], [place] [address]    Report Status 06/30/2019 FINAL  Final  Culture, fungus without smear     Status: None (Preliminary result)   Collection Time: 06/27/19  4:00 PM   Specimen: PATH Cytology CSF; Cerebrospinal Fluid  Result Value Ref Range Status   Specimen Description   Final    CSF Performed at [facility], [address], [place], [place] [address]    Special Requests   Final    NONE Performed at [facility],  [address], [place], [place] [address]    Culture   Final    NO FUNGUS ISOLATED AFTER 5 DAYS Performed at [facility], [address], [place], [place] [address]    Report Status PENDING  Incomplete         Radiology Studies: Ct Angio Chest Pe W Or Wo Contrast  Result Date: 07/03/2019 CLINICAL DATA:  Chest pain, shortness of breath. EXAM: CT ANGIOGRAPHY CHEST WITH CONTRAST TECHNIQUE: Multidetector CT imaging of the chest was performed using the standard protocol during bolus administration of intravenous contrast. Multiplanar CT image reconstructions and MIPs were obtained to evaluate the vascular anatomy. CONTRAST:  OMNIPAQUE IOHEXOL 350 MG/ML SOLN COMPARISON:  Abdominal CT study of 06/21/2019 FINDINGS: Cardiovascular: Thoracic aorta is normal. Branch vessels in the chest are patent. Central pulmonary arteries are engorged. No signs of filling defect to suggest pulmonary embolism. Heart size is mildly enlarged. No signs significant pericardial effusion. Mediastinum/Nodes: No signs of mediastinal adenopathy. No signs of hilar mass. No axillary adenopathy. Lungs/Pleura: Large bilateral pleural effusions. Upper lobes and right middle lobe with aeration. Bilateral lower lobar collapse. Partial volume loss also and right upper lobe. No signs of pneumothorax. Upper lobe along the medial right chest, right paramediastinal border a 2.3 x 2.5 cm area of hypodensity is contiguous with areas of volume loss in the chest. Upper Abdomen: Signs of ascites and peritoneal nodularity with similar appearance to prior exam. Small [place] lesions and signs of portal to hepatic venous shunting in the liver similar to previous exam. No new findings of note in the upper abdomen based on comparison with recent  abdominal CT. Musculoskeletal: No signs of acute bone process or destructive bone lesion. Evidence of lower thoracic laminectomy. Review of the MIP images confirms the above findings. IMPRESSION: No  signs of acute pulmonary embolism. Enlarging bilateral effusions as compared to study of 06/21/2019. Effusions are markedly enlarged with partial aeration of lung in the right middle and upper lobe and in the left upper lobe. Potential mass versus is pneumonia along the right mediastinal border. Aortic Atherosclerosis (ICD10-I70.0). Electronically Signed   By: Donzetta Kohut M.D.   On: 07/03/2019 11:02        Scheduled Meds:  amLODipine  5 mg Oral Daily   chlorhexidine  15 mL Mouth Rinse BID   Chlorhexidine Gluconate Cloth  6 each Topical Daily   famotidine  20 mg Oral Daily   feeding supplement  1 Container Oral Q24H   feeding supplement (ENSURE ENLIVE)  237 mL Oral Q24H   feeding supplement (PRO-STAT SUGAR FREE 64)  30 mL Oral Daily   furosemide  40 mg Intravenous BID   gabapentin  100 mg Oral TID   mouth rinse  15 mL Mouth Rinse q12n4p   metoprolol tartrate  50 mg Oral BID   mirtazapine  7.5 mg Oral QHS   multivitamin with minerals  1 tablet Oral Daily   QUEtiapine  25 mg Oral Daily   rivaroxaban  20 mg Oral Q supper   sodium chloride (PF)       sodium chloride flush  10-40 mL Intracatheter Q12H   Continuous Infusions:    LOS: 13 days   Time spent= 35 mins    Kewana Sanon Joline Maxcy, MD [facility]  If 7PM-7AM, please contact night-coverage [email or web address] 07/03/2019, 11:55 AM

## 2019-07-03 NOTE — Progress Notes (Signed)
Nutrition Follow-up  DOCUMENTATION CODES:   Not applicable  INTERVENTION:  - continue Boost Breeze, Ensure Enlive, and prostat each once/day. - continue to encourage PO intakes. - recommend initiation of TPN via port if within [place] and if desired by patient and her husband. * weigh patient today.    NUTRITION DIAGNOSIS:   Increased nutrient needs related to acute illness, chronic illness, cancer and cancer related treatments as evidenced by estimated needs. 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 She presented to the ED on 10/2 with a fever and AMS. She had recently been diagnosed with E. coli UTI which was treated with Cipro. Mental status began to improve once she arrived to the ED. She reported severe abdominal pain in the ED.  Patient has not been weighed since admission (10/1). Per review of flow sheet, most recent intake was 25% of dinner last night. Per review of orders, she has been accepting Boost Breeze ~25% of the time offered, Ensure ~15% of the time offered, and prostat ~25% of the time offered. Large breakfast and multiple snacks and beverages brought in by husband on bedside table. Patient was attempting to suck on ice chips during RD visit. She denies any overt pain but reports that she is constantly nauseated. She likes the hospital food and does try to eat but reports that every time she eats she vomits. She feels that none of the anti-nausea medications provided to her have lessened or resolved nauseous feeling. She reports she is beginning to  feel extremely weak.   Briefly talked with patient about TPN initiation via port. Patient stated she would be interested in this option and that she would appreciate RD talking with MD about this. Messaged Dr. Reesa Chew and he plans to further discuss with patient and her husband.   Per notes: - fevers with SIRS--resolved; s/p lumbar puncture which did not show any signs of infection - moderate bilateral pleural effusions--third spacing present - persistent sinus tachycardia - SBO 2/2 peritoneal carcinomatosis/metastatic disease--conservative management - intermittent delirium with paranoia--CT head negative; cerebral spinal fluid sent for cystology - breast cancer with diffuse mets--opting for less aggressive measures; Palliative Care following - DNR - cauda equina syndrome with neurogenic bladder 2/2 spinal mets - acute on chronic anemia - malignant ascites s/p paracentesis 10/8 - when able to discharge, plan is for home with home health    Labs reviewed; CBGs: 80 and 77 mg/dl, Cl: 115 mmol/l, Ca: 8.7 mg/dl. Medications reviewed; 20 mg oral pepcid/day, 80 mg IV lasix x1 dose 10/14, 800 mg mag-ox x1 dose 10/14, daily multivitamin with minerals, PRN zoran, PRN phenergan, 40 mEq Klor-Con x1 dose 10/14.     NUTRITION - FOCUSED PHYSICAL EXAM:  completed; no muscle and no fat wasting, moderate pitting edema to BUE and severe/deep pitting edema to BLE.   Diet Order:   Diet Order            DIET SOFT Room service appropriate? Yes; Fluid consistency: Thin  Diet effective now              EDUCATION NEEDS:  No education needs have been identified at this time  Skin:  Skin Assessment: Skin Integrity Issues: Skin Integrity Issues:: Stage II Stage II: coccyx  Last BM:  10/6  Height:   Ht Readings from Last 1 Encounters:  06/20/19 5\' 4"  (1.626 m)    Weight:   Wt Readings from Last 1 Encounters:  06/20/19 63.5 kg    Ideal Body Weight:  54.5 kg  BMI:  Body mass index is  24.03 kg/m.  Estimated Nutritional Needs:   Kcal:  2100-2300 kcal  Protein:  105-115 grams  Fluid:  >/= 2 L/day      Jarome Matin, MS, RD, LDN, [facility] Inpatient Clinical Dietitian Pager # [phone] After hours/weekend pager # [phone]

## 2019-07-03 NOTE — Progress Notes (Signed)
  Echocardiogram 2D Echocardiogram has been performed.  Kaylee Randolph 07/03/2019, 2:35 PM

## 2019-07-04 LAB — BASIC METABOLIC PANEL
Anion gap: 8 (ref 5–15)
BUN: 19 mg/dL (ref 6–20)
CO2: 20 mmol/L — ABNORMAL LOW (ref 22–32)
Calcium: 8.7 mg/dL — ABNORMAL LOW (ref 8.9–10.3)
Chloride: 111 mmol/L (ref 98–111)
Creatinine, Ser: 0.75 mg/dL (ref 0.44–1.00)
GFR calc Af Amer: >60 mL/min (ref >60)
GFR calc non Af Amer: >60 mL/min (ref >60)
Glucose, Bld: 102 mg/dL — ABNORMAL HIGH (ref 70–99)
Potassium: 3.8 mmol/L (ref 3.5–5.1)
Sodium: 139 mmol/L (ref 135–145)

## 2019-07-04 LAB — CBC
HCT: 27.7 % — ABNORMAL LOW (ref 36.0–46.0)
Hemoglobin: 8.5 g/dL — ABNORMAL LOW (ref 12.0–15.0)
MCH: 26.7 pg (ref 26.0–34.0)
MCHC: 30.7 g/dL (ref 30.0–36.0)
MCV: 87.1 fL (ref 80.0–100.0)
Platelets: 575 10*3/uL — ABNORMAL HIGH (ref 150–400)
RBC: 3.18 MIL/uL — ABNORMAL LOW (ref 3.87–5.11)
RDW: 19.8 % — ABNORMAL HIGH (ref 11.5–15.5)
WBC: 9.3 10*3/uL (ref 4.0–10.5)
nRBC: 0 % (ref 0.0–0.2)

## 2019-07-04 LAB — ECHOCARDIOGRAM COMPLETE
Height: 64 in
Weight: 2240 oz

## 2019-07-04 LAB — GLUCOSE, CAPILLARY
Glucose-Capillary: 109 mg/dL — ABNORMAL HIGH (ref 70–99)
Glucose-Capillary: 95 mg/dL (ref 70–99)
Glucose-Capillary: 113 mg/dL — ABNORMAL HIGH (ref 70–99)
Glucose-Capillary: 151 mg/dL — ABNORMAL HIGH (ref 70–99)
Glucose-Capillary: 149 mg/dL — ABNORMAL HIGH (ref 70–99)
Glucose-Capillary: 116 mg/dL — ABNORMAL HIGH (ref 70–99)

## 2019-07-04 LAB — MAGNESIUM: Magnesium: 1.8 mg/dL (ref 1.7–2.4)

## 2019-07-04 LAB — PATHOLOGIST SMEAR REVIEW

## 2019-07-04 NOTE — Progress Notes (Signed)
Discussed the need for nutrition with the patient's husband and patient.  Offered them TPN which can be infused via Chemo-Port.  Also discussed with oncology service who gracefully accepted to manage this outpatient as well. After discussion between patient and the husband, they have decided at this time to encourage oral intake as much as possible.  Over next few days if her oral intake does not improve, they will explore starting TPN at that time.

## 2019-07-04 NOTE — Progress Notes (Signed)
NUTRITION NOTE  Dr. Reesa Chew was able to talk with patient, her husband, and Oncology about TPN via port. At this time, patient and husband have decided to encourage PO intakes and that if intakes do not improve in the next few days, then they will again discuss TPN.  RD will continue to follow per protocol.     Jarome Matin, MS, RD, LDN, [facility] Inpatient Clinical Dietitian Pager # [phone] After hours/weekend pager # [phone]

## 2019-07-04 NOTE — Progress Notes (Signed)
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  Initially diagnosed with E. coli UTI treated with Cipro but due to persistent fevers without any other clear source was started on vancomycin and cefepime for presumed sepsis.  By infectious disease.   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  Drug-related versus malignancy. -Completed 5-day course vancomycin/cefepime - Lumbar puncture- no infection per preliminary results -Seen by infectious disease.  Now signed off  Moderate bilateral effusions, progressing -IV Lasix.  Monitor urine output -Status post thoracentesis 10/14-1.2 L removed.  No obvious evidence of infection.  Sinus tachycardia; slightly improved -She is mostly third spacing her fluids - Continue metoprolol 50 mg twice daily  Small bowel obstruction secondary to peritoneal carcinomatosis/metastatic disease -Conservative management per general surgery  Intermittent delirium with paranoia -CT head negative.   - CSF sent for cytology - Low-dose Seroquel - Tizanidine  PRN.  Breast cancer with diffuse metastasis -Thrombocytopenia oncology status post recent chemotherapy.  Seen by palliative care-opting for less aggressive measures. -Received a dose of carboplatin - Will defer to oncology for long-term plans.  Appreciate input from palliative care team.  Eventually follow-up with Dr Caswell Corwin  Patient is DNR -MRI head- negative - Cardiology following.  History of pulmonary embolism - On Xarelto.  Cauda equina syndrome With neurogenic bladder -Secondary to spinal metastasis.  Foley replaced  Acute on chronic anemia -Monitor hemoglobin.  Malignant ascites - Status post paracentesis 10/8.  Stage II decubitus ulcer  Significant amount of protein calorie malnutrition, severe -Poor oral intake.  Discussed need for TPN with the patient and husband, they are to discuss this.  Oncology team in agreement and will help manage as outpatient as well. -Dietitian following.  Appreciate her input.  Very poor prognosis secondary to advanced metastases.  DVT prophylaxis: Xarelto Code Status: DNI but okay with CPR. Family Communication: Called her husband Luisa Hart, no answer. disposition Plan: Maintain hospital stay TPN has been adjusted and her breathing is remained stable.  Consultants:   Ortho  Infectious disease  Palliative care  Dementia radiology   Subjective: States her dyspnea is better after thoracentesis yesterday.  1.2 L fluid removed. At this time she would like to discuss TPN with her husband.  Review of Systems Otherwise negative except as per HPI, including: General = no fevers, chills, dizziness, malaise, fatigue HEENT/EYES = negative for pain, redness, loss of vision, double vision, blurred vision, loss of hearing, sore throat, hoarseness, dysphagia Cardiovascular= negative for chest pain, palpitation, murmurs, lower extremity swelling Respiratory/lungs= negative for shortness of breath, cough, hemoptysis, wheezing, mucus  production Gastrointestinal= negative for nausea, vomiting,, abdominal pain, melena, hematemesis Genitourinary= negative for Dysuria, Hematuria,  Change in Urinary Frequency MSK = Negative for arthralgia, myalgias, Back Pain, Joint swelling  Neurology= Negative for headache, seizures, numbness, tingling  Psychiatry= Negative for anxiety, depression, suicidal and homocidal ideation Allergy/Immunology= Medication/Food allergy as listed  Skin= Negative for Rash, lesions, ulcers, itching   Objective: Vitals:   07/04/19 0900 07/04/19 0905 07/04/19 0906 07/04/19 0930  BP:  (!) 133/93 (!) 133/93   Pulse: (!) 118 (!) 117  (!) 119  Resp: 20   (!) 25  Temp:      TempSrc:      SpO2: 90%   93%  Weight:      Height:        Intake/Output Summary (Last 24 hours) at 07/04/2019 1107 Last data filed at 07/04/2019 0445 Gross per 24 hour  Intake 490 ml  Output 1100 ml  Net -610 ml   Filed Weights   06/20/19 2014  Weight: 63.5 kg    Examination:  Constitutional: NAD, calm, comfortable Eyes: PERRL, lids and conjunctivae normal ENMT: Mucous membranes are moist. Posterior pharynx clear of any exudate or lesions.Normal dentition.  Right chest wall port in place Neck: normal, supple, no masses, no thyromegaly Respiratory: Bibasilar crackles Cardiovascular: Regular rate and rhythm, no murmurs / rubs / gallops.  1+ bilateral lower extremity pitting edema 2+ pedal pulses. No carotid bruits.  Abdomen: no tenderness, no masses palpated. No hepatosplenomegaly. Bowel sounds positive.  Musculoskeletal: no clubbing / cyanosis. No joint deformity upper and lower extremities. Good ROM, no contractures. Normal muscle tone.  Skin: no rashes, lesions, ulcers. No induration Neurologic: CN 2-12 grossly intact. Sensation intact, DTR normal. Strength 4/5 in all 4.  Psychiatric: Normal judgment and insight. Alert and oriented x 3. Normal mood.   Chest wall port in place  Data Reviewed:   CBC: Recent Labs   Lab 06/30/19 0545 07/01/19 0353 07/02/19 0450 07/03/19 0600 07/04/19 0333  WBC 11.2* 9.3 9.6 10.1 9.3  NEUTROABS 9.5*  --   --   --   --   HGB 8.5* 8.5* 8.6* 8.6* 8.5*  HCT 27.5* 27.3* 28.0* 27.5* 27.7*  MCV 87.3 87.2 88.3 88.4 87.1  PLT 538* 556* 593* 552* 575*   Basic Metabolic Panel: Recent Labs  Lab 06/28/19 1408  06/30/19 0545 07/01/19 0353 07/02/19 0450 07/03/19 0600 07/04/19 0333  NA 137   < > 138 140 140 140 139  K 4.5   < > 4.8 4.2 4.2 3.8 3.8  CL 113*   < > 113* 115* 115* 115* 111  CO2 17*   < > 17* 17* 19* 18* 20*  GLUCOSE 93   < > 93 88 105* 84 102*  BUN 14   < > 19 21* 19 19 19   CREATININE 0.62   < > 0.70 0.77 0.72 0.70 0.75  CALCIUM 8.5*   < > 8.6* 8.8* 8.9 8.7* 8.7*  MG 1.7  --   --  1.8 1.8 1.7 1.8   < > = values in this interval not displayed.   GFR: Estimated Creatinine Clearance: 70.2 mL/min (by C-G formula based on SCr of 0.75 mg/dL). Liver Function Tests: Recent Labs  Lab 06/30/19 0545  AST 14*  ALT 12  ALKPHOS 84  BILITOT 0.6  PROT 5.1*  ALBUMIN 1.8*   No results for input(s): LIPASE, AMYLASE in the last 168 hours. No results for input(s): AMMONIA in the last 168 hours. Coagulation Profile: No results for input(s): INR, PROTIME in the last 168 hours. Cardiac Enzymes: No results for  input(s): CKTOTAL, CKMB, CKMBINDEX, TROPONINI in the last 168 hours. BNP (last 3 results) No results for input(s): PROBNP in the last 8760 hours. HbA1C: No results for input(s): HGBA1C in the last 72 hours. CBG: Recent Labs  Lab 07/03/19 1930 07/03/19 2302 07/03/19 2334 07/04/19 0322 07/04/19 0744  GLUCAP 114* 53* 83 109* 95   Lipid Profile: No results for input(s): CHOL, HDL, LDLCALC, TRIG, CHOLHDL, LDLDIRECT in the last 72 hours. Thyroid Function Tests: No results for input(s): TSH, T4TOTAL, FREET4, T3FREE, THYROIDAB in the last 72 hours. Anemia Panel: No results for input(s): VITAMINB12, FOLATE, FERRITIN, TIBC, IRON, RETICCTPCT in the last 72  hours. Sepsis Labs: No results for input(s): PROCALCITON, LATICACIDVEN in the last 168 hours.  Recent Results (from the past 240 hour(s))  Culture, blood (Routine X 2) w Reflex to ID Panel     Status: None   Collection Time: 06/26/19  1:40 PM   Specimen: BLOOD RIGHT HAND  Result Value Ref Range Status   Specimen Description   Final    BLOOD RIGHT HAND Performed at [facility], [address], [place], [place] [address]    Special Requests   Final    BOTTLES DRAWN AEROBIC ONLY Blood Culture results may not be optimal due to an inadequate volume of blood received in culture bottles Performed at [facility], [address] [place], [place] [address]    Culture   Final    NO GROWTH 5 DAYS Performed at [facility], [address], [place], [place] [address]    Report Status 07/01/2019 FINAL  Final  Culture, blood (Routine X 2) w Reflex to ID Panel     Status: Abnormal   Collection Time: 06/26/19  1:40 PM   Specimen: BLOOD  Result Value Ref Range Status   Specimen Description   Final    BLOOD LEFT ANTECUBITAL Performed at [facility], [address] [place], [place] [address]    Special Requests   Final    BOTTLES DRAWN AEROBIC ONLY Blood Culture results may not be optimal due to an inadequate volume of blood received in culture bottles Performed at [facility], [address], [place], [place] [address]    Culture  Setup Time   Final    AEROBIC BOTTLE ONLY GRAM POSITIVE COCCI CRITICAL RESULT CALLED TO, READ BACK BY AND VERIFIED WITH: PHARMD [id number] AT 701 AM BY CM    Culture (A)  Final    STAPHYLOCOCCUS SPECIES (COAGULASE NEGATIVE) THE SIGNIFICANCE OF ISOLATING THIS ORGANISM FROM A SINGLE SET OF BLOOD CULTURES WHEN MULTIPLE SETS ARE DRAWN IS UNCERTAIN. PLEASE NOTIFY THE MICROBIOLOGY DEPARTMENT WITHIN ONE WEEK IF SPECIATION AND SENSITIVITIES ARE REQUIRED. Performed at [facility],  [address] [place], [place] [address]    Report Status 06/29/2019 FINAL  Final  Blood Culture ID Panel (Reflexed)     Status: Abnormal   Collection Time: 06/26/19  1:40 PM  Result Value Ref Range Status   Enterococcus species NOT DETECTED NOT DETECTED Final   Listeria monocytogenes NOT DETECTED NOT DETECTED Final   Staphylococcus species DETECTED (A) NOT DETECTED Final    Comment: Methicillin (oxacillin) resistant coagulase negative staphylococcus. Possible blood culture contaminant (unless isolated from more than one blood culture draw or clinical case suggests pathogenicity). No antibiotic treatment is indicated for blood  culture contaminants. CRITICAL RESULT CALLED TO, READ BACK BY AND VERIFIED WITH: PHARMD [id number] AT 703 AM BY CM  Staphylococcus aureus (BCID) NOT DETECTED NOT DETECTED Final   Methicillin resistance DETECTED (A) NOT DETECTED Final    Comment: CRITICAL RESULT CALLED TO, READ BACK BY AND VERIFIED WITH: PHARMD T GREEN [id number] AT 703 BY CM    Streptococcus species NOT DETECTED NOT DETECTED Final   Streptococcus agalactiae NOT DETECTED NOT DETECTED Final   Streptococcus pneumoniae NOT DETECTED NOT DETECTED Final   Streptococcus pyogenes NOT DETECTED NOT DETECTED Final   Acinetobacter baumannii NOT DETECTED NOT DETECTED Final   Enterobacteriaceae species NOT DETECTED NOT DETECTED Final   Enterobacter cloacae complex NOT DETECTED NOT DETECTED Final   Escherichia coli NOT DETECTED NOT DETECTED Final   Klebsiella oxytoca NOT DETECTED NOT DETECTED Final   Klebsiella pneumoniae NOT DETECTED NOT DETECTED Final   Proteus species NOT DETECTED NOT DETECTED Final   Serratia marcescens NOT DETECTED NOT DETECTED Final   Haemophilus influenzae NOT DETECTED NOT DETECTED Final   Neisseria meningitidis NOT DETECTED NOT DETECTED Final   Pseudomonas aeruginosa NOT DETECTED NOT DETECTED Final   Candida albicans NOT DETECTED NOT DETECTED Final   Candida glabrata NOT DETECTED NOT  DETECTED Final   Candida krusei NOT DETECTED NOT DETECTED Final   Candida parapsilosis NOT DETECTED NOT DETECTED Final   Candida tropicalis NOT DETECTED NOT DETECTED Final    Comment: Performed at [facility], [address], [place], [place] [address]  Fungus Culture With Stain     Status: None (Preliminary result)   Collection Time: 06/27/19 11:45 AM   Specimen: Abdomen; Peritoneal Fluid  Result Value Ref Range Status   Fungus Stain Final report  Final    Comment: (NOTE) Performed At: [facility] [address] [place], [place] [id number] Jolene Schimke MD OZ:[id number]    Fungus (Mycology) Culture PENDING  Incomplete   Fungal Source ABDOMEN  Final    Comment: Performed at [facility], [address], [place], [place] [address]  Acid Fast Smear (AFB)     Status: None   Collection Time: 06/27/19 11:45 AM   Specimen: Abdomen; Peritoneal Fluid  Result Value Ref Range Status   AFB Specimen Processing Comment  Final    Comment: Tissue Grinding and Digestion/Decontamination   Acid Fast Smear Negative  Final    Comment: (NOTE) Performed At: [facility] [address] [place], [place] [id number] Jolene Schimke MD UU:[id number]    Source (AFB) ABDOMEN  Final    Comment: Performed at [facility], [address], [place], [place] [address]  Culture, body fluid-bottle     Status: None   Collection Time: 06/27/19 11:45 AM   Specimen: Abdomen  Result Value Ref Range Status   Specimen Description ABDOMEN  Final   Special Requests NONE  Final   Culture   Final    NO GROWTH 5 DAYS Performed at [facility], [address] [place], [place] [address]    Report Status 07/02/2019 FINAL  Final  Gram stain     Status: None   Collection Time: 06/27/19 11:45 AM   Specimen: Abdomen  Result Value Ref Range Status   Specimen Description ABDOMEN  Final   Special Requests NONE  Final   Gram Stain   Final    RARE WBC  PRESENT,BOTH PMN AND MONONUCLEAR NO ORGANISMS SEEN Performed at [facility], [address] [place], [place] [address]    Report Status 06/27/2019 FINAL  Final  Fungus Culture Result     Status: None  Collection Time: 06/27/19 11:45 AM  Result Value Ref Range Status   Result 1 Comment  Final    Comment: (NOTE) KOH/Calcofluor preparation:  no fungus observed. Performed At: [facility] [address] [place], [place] [id number] Jolene Schimke MD MV:[id number]   Anaerobic culture     Status: None   Collection Time: 06/27/19  4:00 PM   Specimen: PATH Cytology CSF; Cerebrospinal Fluid  Result Value Ref Range Status   Specimen Description   Final    CSF Performed at [facility], [address], [place], [place] [address]    Special Requests   Final    NONE Performed at [facility], [address], [place], [place] [address]    Culture   Final    NO ANAEROBES ISOLATED Performed at [facility], [address], [place], [place] [address]    Report Status 07/02/2019 FINAL  Final  CSF culture     Status: None   Collection Time: 06/27/19  4:00 PM   Specimen: PATH Cytology CSF; Cerebrospinal Fluid  Result Value Ref Range Status   Specimen Description   Final    CSF Performed at [facility], [address], [place], [place] [address]    Special Requests   Final    NONE Performed at [facility], [address] [place], [place] [address]    Gram Stain   Final    WBC PRESENT,BOTH PMN AND MONONUCLEAR NO ORGANISMS SEEN CYTOSPIN SMEAR    Culture   Final    NO GROWTH 3 DAYS Performed at [facility], [address], [place], [place] [address]    Report Status 06/30/2019 FINAL  Final  Culture, fungus without smear     Status: None (Preliminary result)   Collection Time: 06/27/19  4:00 PM   Specimen: PATH Cytology CSF; Cerebrospinal Fluid  Result Value Ref Range Status    Specimen Description   Final    CSF Performed at [facility], [address], [place], [place] [address]    Special Requests   Final    NONE Performed at [facility], [address] [place], [place] [address]    Culture   Final    NO FUNGUS ISOLATED AFTER 6 DAYS Performed at [facility], [address], [place], [place] [address]    Report Status PENDING  Incomplete  Body fluid culture     Status: None (Preliminary result)   Collection Time: 07/03/19  3:44 PM   Specimen: Lung, Right; Pleural Fluid  Result Value Ref Range Status   Specimen Description   Final    PLEURAL  RIGHT LUNG Performed at [facility], [address] [place], [place] [address]    Special Requests   Final    NONE Performed at [facility], [address], [place], [place] [address]    Gram Stain   Final    RARE WBC PRESENT, PREDOMINANTLY PMN NO ORGANISMS SEEN    Culture   Final    NO GROWTH < 24 HOURS Performed at [facility], [address], [place], [place] [address]    Report Status PENDING  Incomplete         Radiology Studies: Dg Chest 1 View  Result Date: 07/03/2019 CLINICAL DATA:  53 year old female status post right thoracentesis. History of breast cancer. EXAM: CHEST  1 VIEW COMPARISON:  Chest radiograph dated  07/01/2019 and CT dated 07/03/2019 FINDINGS: Interval decrease in the size of the right-sided pleural effusion. Small residual right pleural effusion remains. There is a small left pleural effusion, greater than right. There are bibasilar atelectasis. Pneumonia is not excluded. Right upper lobe interstitial coarsening and bronchitic changes. There is no pneumothorax. Right-sided Port-A-Cath with tip in stable position. Stable cardiac silhouette. No acute osseous pathology. IMPRESSION: 1. Interval decrease in the size of the right pleural effusion. No pneumothorax. 2. Small left pleural effusion  and bibasilar atelectasis. Electronically Signed   By: Elgie Collard M.D.   On: 07/03/2019 16:24   Ct Angio Chest Pe W Or Wo Contrast  Result Date: 07/03/2019 CLINICAL DATA:  Chest pain, shortness of breath. EXAM: CT ANGIOGRAPHY CHEST WITH CONTRAST TECHNIQUE: Multidetector CT imaging of the chest was performed using the standard protocol during bolus administration of intravenous contrast. Multiplanar CT image reconstructions and MIPs were obtained to evaluate the vascular anatomy. CONTRAST:  OMNIPAQUE IOHEXOL 350 MG/ML SOLN COMPARISON:  Abdominal CT study of 06/21/2019 FINDINGS: Cardiovascular: Thoracic aorta is normal. Branch vessels in the chest are patent. Central pulmonary arteries are engorged. No signs of filling defect to suggest pulmonary embolism. Heart size is mildly enlarged. No signs significant pericardial effusion. Mediastinum/Nodes: No signs of mediastinal adenopathy. No signs of hilar mass. No axillary adenopathy. Lungs/Pleura: Large bilateral pleural effusions. Upper lobes and right middle lobe with aeration. Bilateral lower lobar collapse. Partial volume loss also and right upper lobe. No signs of pneumothorax. Upper lobe along the medial right chest, right paramediastinal border a 2.3 x 2.5 cm area of hypodensity is contiguous with areas of volume loss in the chest. Upper Abdomen: Signs of ascites and peritoneal nodularity with similar appearance to prior exam. Small [place] lesions and signs of portal to hepatic venous shunting in the liver similar to previous exam. No new findings of note in the upper abdomen based on comparison with recent abdominal CT. Musculoskeletal: No signs of acute bone process or destructive bone lesion. Evidence of lower thoracic laminectomy. Review of the MIP images confirms the above findings. IMPRESSION: No signs of acute pulmonary embolism. Enlarging bilateral effusions as compared to study of 06/21/2019. Effusions are markedly enlarged with partial  aeration of lung in the right middle and upper lobe and in the left upper lobe. Potential mass versus is pneumonia along the right mediastinal border. Aortic Atherosclerosis (ICD10-I70.0). Electronically Signed   By: Donzetta Kohut M.D.   On: 07/03/2019 11:02   US Thoracentesis Asp Pleural Space W/img Guide  Result Date: 07/03/2019 INDICATION: Patient with history of metastatic breast cancer, dyspnea, bilateral pleural effusions right greater than left. Request received for diagnostic and therapeutic right thoracentesis. EXAM: ULTRASOUND GUIDED DIAGNOSTIC AND THERAPEUTIC RIGHT THORACENTESIS MEDICATIONS: None COMPLICATIONS: None immediate. PROCEDURE: An ultrasound guided thoracentesis was thoroughly discussed with the patient and questions answered. The benefits, risks, alternatives and complications were also discussed. The patient understands and wishes to proceed with the procedure. Written consent was obtained. Ultrasound was performed to localize and mark an adequate pocket of fluid in the right chest. The area was then prepped and draped in the normal sterile fashion. 1% Lidocaine was used for local anesthesia. Under ultrasound guidance a 6 Fr Safe-T-Centesis catheter was introduced. Thoracentesis was performed. The catheter was removed and a dressing applied. FINDINGS: A total of approximately 1.2 liters of hazy,amber fluid was removed. Samples were sent to the laboratory as requested by the clinical team. IMPRESSION: Successful ultrasound guided diagnostic and therapeutic right  thoracentesis yielding 1.2 liters of pleural fluid. Read by: Jeananne Rama, PA-C No pneumothorax on follow-up radiograph. Electronically Signed   By: Corlis Leak M.D.   On: 07/03/2019 16:04        Scheduled Meds:  amLODipine  5 mg Oral Daily   chlorhexidine  15 mL Mouth Rinse BID   Chlorhexidine Gluconate Cloth  6 each Topical Daily   dextrose  12.5 g Intravenous STAT   famotidine  20 mg Oral Daily   feeding  supplement  1 Container Oral Q24H   feeding supplement (ENSURE ENLIVE)  237 mL Oral Q24H   feeding supplement (PRO-STAT SUGAR FREE 64)  30 mL Oral Daily   furosemide  40 mg Intravenous BID   gabapentin  100 mg Oral TID   mouth rinse  15 mL Mouth Rinse q12n4p   metoprolol tartrate  50 mg Oral BID   mirtazapine  7.5 mg Oral QHS   multivitamin with minerals  1 tablet Oral Daily   QUEtiapine  25 mg Oral Daily   rivaroxaban  20 mg Oral Q supper   sodium chloride flush  10-40 mL Intracatheter Q12H   Continuous Infusions:    LOS: 14 days   Time spent= 35 mins    Zanyia Silbaugh Joline Maxcy, MD [facility]  If 7PM-7AM, please contact night-coverage [email or web address] 07/04/2019, 11:07 AM

## 2019-07-05 LAB — GLUCOSE, CAPILLARY
Glucose-Capillary: 121 mg/dL — ABNORMAL HIGH (ref 70–99)
Glucose-Capillary: 109 mg/dL — ABNORMAL HIGH (ref 70–99)
Glucose-Capillary: 101 mg/dL — ABNORMAL HIGH (ref 70–99)

## 2019-07-05 LAB — BASIC METABOLIC PANEL
Anion gap: 9 (ref 5–15)
BUN: 22 mg/dL — ABNORMAL HIGH (ref 6–20)
CO2: 21 mmol/L — ABNORMAL LOW (ref 22–32)
Calcium: 8.7 mg/dL — ABNORMAL LOW (ref 8.9–10.3)
Chloride: 108 mmol/L (ref 98–111)
Creatinine, Ser: 0.92 mg/dL (ref 0.44–1.00)
GFR calc Af Amer: >60 mL/min (ref >60)
GFR calc non Af Amer: >60 mL/min (ref >60)
Glucose, Bld: 108 mg/dL — ABNORMAL HIGH (ref 70–99)
Potassium: 3.6 mmol/L (ref 3.5–5.1)
Sodium: 138 mmol/L (ref 135–145)

## 2019-07-05 LAB — CYTOLOGY - NON PAP

## 2019-07-05 LAB — CBC
HCT: 24.8 % — ABNORMAL LOW (ref 36.0–46.0)
Hemoglobin: 7.8 g/dL — ABNORMAL LOW (ref 12.0–15.0)
MCH: 27.3 pg (ref 26.0–34.0)
MCHC: 31.5 g/dL (ref 30.0–36.0)
MCV: 86.7 fL (ref 80.0–100.0)
Platelets: 610 10*3/uL — ABNORMAL HIGH (ref 150–400)
RBC: 2.86 MIL/uL — ABNORMAL LOW (ref 3.87–5.11)
RDW: 20 % — ABNORMAL HIGH (ref 11.5–15.5)
WBC: 6.7 10*3/uL (ref 4.0–10.5)
nRBC: 0 % (ref 0.0–0.2)

## 2019-07-05 LAB — MAGNESIUM: Magnesium: 1.6 mg/dL — ABNORMAL LOW (ref 1.7–2.4)

## 2019-07-05 MED ORDER — HEPARIN SOD (PORK) LOCK FLUSH 100 UNIT/ML IV SOLN
500.0000 [IU] | INTRAVENOUS | Status: DC
  Administered 2019-07-05: 500 [IU]

## 2019-07-05 MED ORDER — METOPROLOL TARTRATE 50 MG PO TABS: 50.0000 mg | ORAL_TABLET | Freq: Two times a day (BID) | ORAL | 0 refills | Status: DC

## 2019-07-05 MED ORDER — MAGNESIUM SULFATE 2 GM/50ML IV SOLN
2.0000 g | Freq: Once | INTRAVENOUS | Status: AC
  Administered 2019-07-05: 2 g via INTRAVENOUS
  Filled 2019-07-05: qty 50

## 2019-07-05 MED ORDER — AMLODIPINE BESYLATE 5 MG PO TABS: 5.0000 mg | ORAL_TABLET | Freq: Every day | ORAL | 0 refills | Status: DC

## 2019-07-05 MED ORDER — HEPARIN SOD (PORK) LOCK FLUSH 100 UNIT/ML IV SOLN: 500.0000 [IU] | INTRAVENOUS | Status: DC | PRN

## 2019-07-05 MED ORDER — POTASSIUM CHLORIDE 10 MEQ/100ML IV SOLN
10.0000 meq | INTRAVENOUS | Status: AC
  Administered 2019-07-05 (×3): 10 meq via INTRAVENOUS
  Filled 2019-07-05 (×3): qty 100

## 2019-07-05 MED ORDER — ADULT MULTIVITAMIN W/MINERALS CH: 1.0000 | ORAL_TABLET | Freq: Every day | ORAL | Status: DC

## 2019-07-05 MED ORDER — POTASSIUM CHLORIDE 10 MEQ/100ML IV SOLN
10.0000 meq | Freq: Once | INTRAVENOUS | Status: AC
  Administered 2019-07-05: 10 meq via INTRAVENOUS
  Filled 2019-07-05: qty 100

## 2019-07-05 MED ORDER — QUETIAPINE FUMARATE 25 MG PO TABS: 25.0000 mg | ORAL_TABLET | Freq: Every day | ORAL | 0 refills | Status: AC

## 2019-07-05 MED ORDER — FUROSEMIDE 40 MG PO TABS: 40.0000 mg | ORAL_TABLET | Freq: Every day | ORAL | 0 refills | Status: DC

## 2019-07-05 NOTE — Discharge Summary (Signed)
Physician Discharge Summary  Kaylee Randolph [id number] DOB: 1966-07-21 DOA: 06/20/2019  PCP: Maury Dus, MD  Admit date: 06/20/2019 Discharge date: 07/05/2019  Admitted From: Home Disposition:  Home with HT  Recommendations for Outpatient Follow-up:  1. Follow up with PCP in 1-2 weeks 2. Please obtain BMP/CBC in one week your next doctors visit.  3. Started on Lasix daily.  Norvasc, Seroquel prescribed 4. Chest x-ray in 1 week 5. Follow-up with Dr. Wetzel Bjornstad at [facility] oncology, arrangements to be made by oncology service.  Home Health: PT/OT/RN/aide/social worker. 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  Initially diagnosed with E. coli UTI treated with Cipro but due to persistent fevers without any other clear source was started on vancomycin and cefepime for presumed sepsis.  By infectious disease.  During the hospitalization completed antibiotic course.  No obvious source of infection was found.  Work-up was negative in the hospital.  Fevers could have been related to malignancy.  Small bowel obstruction was managed conservatively.  Had bilateral pleural effusion therefore thoracentesis was performed on 10/14 which he tolerated well and felt much better. Has breast cancer with metastases which was managed by oncology team and care was coordinated by Dr. Lindi Adie and Dr. Wetzel Bjornstad at [facility].  Family to follow-up at [facility] oncology and in the near future transition care locally here in [place]. Although her oral intake was poor, she was offered TPN but husband and the patient opted to continue oral intake as much as possible and  explore TPN option in the near future.  Foley catheter was placed due to urinary retention.  Husband routinely performs in and out catheter 3 times daily at home.  He will speak with the husband to see if they would like to leave Foley catheter in at the time of discharge or go back to periodic in and out catheter.  Spoke with patient's husband, Saralyn Pilar over the phone prior to discharge.  All the questions answered.  He is very appreciative of patient care in the hospital and understands instructions.  Overall stable for discharge.   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  No obvious signs of infection.  Completed course of vancomycin/cefepime empirically -Lumbar puncture-negative for any acute infection.  Moderate bilateral effusions, status post thoracentesis -Lasix p.o. 40 mg daily -Status post thoracentesis 10/14-1.2 L removed.  Symptomatically feeling much better.  Repeat chest x-ray in 1 week after discharge  Sinus tachycardia; slightly improved -Improved with fluid and metoprolol 50 mg twice daily  Small bowel obstruction secondary to peritoneal carcinomatosis/metastatic disease -Conservative management per general surgery  Intermittent delirium with paranoia -CT head negative.   - CSF sent for cytology - Low-dose Seroquel - Tizanidine PRN.  Breast cancer with diffuse  metastasis -Thrombocytopenia oncology status post recent  chemotherapy.  Seen by palliative care-opting for less aggressive measures. -Received a dose of carboplatin - Will defer to oncology for long-term plans.  Appreciate input from palliative care team.  Eventually follow-up with Dr Wetzel Bjornstad  Patient is DNR -MRI head- negative  History of pulmonary embolism - On Xarelto.  Cauda equina syndrome With neurogenic bladder -Secondary to spinal metastasis.  Foley replaced but at the time of discharge family may prefer intermittent catheterization.  Patient and family discussed prior to discharge but they prefer  Acute on chronic anemia -Monitor hemoglobin.  Malignant ascites - Status post paracentesis 10/8.  Stage II decubitus ulcer  Significant amount of protein calorie malnutrition, severe -Poor oral intake.  Discussed need for TPN with the patient and husband, they are to discuss this.  Oncology team in agreement and will help manage as outpatient as well. -Dietitian following.  Appreciate her input.  Overall poor prognosis secondary to advanced metastases.  Patient has been very appreciative of their care here.  They are aware of prognosis.  Consultations:  Oncology  Surgery  Palliative care  IR  Subjective: Feels better does not have any complaints this morning.  Very appreciative for care.  Discharge Exam: Vitals:   07/05/19 0605 07/05/19 0800  BP:  119/76  Pulse: (!) 116 (!) 113  Resp: 14 18  Temp:  98.4 F (36.9 C)  SpO2: 97% 92%   Vitals:   07/05/19 0433 07/05/19 0600 07/05/19 0605 07/05/19 0800  BP:  120/81  119/76  Pulse:   (!) 116 (!) 113  Resp:  17 14 18   Temp:    98.4 F (36.9 C)  TempSrc:    Oral  SpO2: 91%  97% 92%  Weight:      Height:        General: Pt is alert, awake, not in acute distress Cardiovascular: RRR, S1/S2 +, no rubs, no gallops Respiratory: CTA bilaterally, no wheezing, no rhonchi Abdominal: Soft, NT, ND, bowel sounds + Extremities: no edema, no cyanosis Right chest wall  port noted  Discharge Instructions   Allergies as of 07/05/2019   No Known Allergies     Medication List    STOP taking these medications   ciprofloxacin 500 MG tablet Commonly known as: CIPRO     TAKE these medications   acetaminophen 500 MG tablet Commonly known as: TYLENOL Take 1,000 mg by mouth every 6 (six) hours as needed for mild pain.   amLODipine 5 MG tablet Commonly known as: NORVASC Take 1 tablet (5 mg total) by mouth daily.   capecitabine 500 MG tablet Commonly known as: XELODA Take 500 mg by mouth.   dexamethasone 2 MG tablet Commonly known as: DECADRON Take 0.5-2 mg by mouth.   famotidine 20 MG tablet Commonly known as: PEPCID Take 20 mg by mouth daily.   furosemide 40 MG tablet Commonly known as: Lasix Take 1 tablet (40 mg total) by mouth daily.   gabapentin 100 MG capsule Commonly known as: NEURONTIN Take 100 mg by mouth 3 (three) times daily.   hydrochlorothiazide 12.5 MG capsule Commonly known as: MICROZIDE Take 12.5 mg by mouth daily.   hyoscyamine 0.125 MG tablet Commonly known as: LEVSIN Take 0.125-0.25 mg by mouth every 4 (four) hours as needed for cramping.   losartan 100 MG tablet Commonly known as: COZAAR Take 100 mg by mouth daily.   metoprolol tartrate 50 MG tablet Commonly known as: LOPRESSOR Take 1 tablet (50 mg total) by  mouth 2 (two) times daily.   mirtazapine 7.5 MG tablet Commonly known as: REMERON Take 7.5 mg by mouth at bedtime.   multivitamin with minerals Tabs tablet Take 1 tablet by mouth daily.   ondansetron 4 MG tablet Commonly known as: ZOFRAN Take 4 mg by mouth every 6 (six) hours as needed for nausea or vomiting.   OxyCONTIN 10 mg 12 hr tablet Generic drug: oxyCODONE Take 10 mg by mouth 2 (two) times daily.   pravastatin 40 MG tablet Commonly known as: PRAVACHOL Take 40 mg by mouth daily.   QUEtiapine 25 MG tablet Commonly known as: SEROQUEL Take 1 tablet (25 mg total) by mouth daily.    rivaroxaban 20 MG Tabs tablet Commonly known as: XARELTO Take 20 mg by mouth daily.   tiZANidine 4 MG tablet Commonly known as: ZANAFLEX Take 4 mg by mouth 3 (three) times daily.      Follow-up [facility], [place]. Call.   Specialty: General Surgery Why: As needed Contact information: [address] [place], [place]. Schedule an appointment as soon as possible for a visit in 1 week(s).   Specialty: Family Medicine Contact information: [place] [address] [id number]        Nicholas Lose, MD. Schedule an appointment as soon as possible for a visit.   Specialty: Hematology and Oncology Why: Coordinating Care with Dr Wetzel Bjornstad from [place].  Contact information: [place] [address] [phone]          No Known Allergies  You were cared for by a hospitalist during your hospital stay. If you have any questions about your discharge medications or the care you received while you were in the hospital after you are discharged, you can call the unit and asked to speak with the hospitalist on call if the hospitalist that took care of you is not available. Once you are discharged, your primary care physician will handle any further medical issues. Please note that no refills for any discharge medications will be authorized once you are discharged, as it is imperative that you return to your primary care physician (or establish a relationship with a primary care physician if you do not have one) for your aftercare needs so that they can reassess your need for medications and monitor your lab values.   Procedures/Studies: Dg Chest 1 View  Result Date: 07/03/2019 CLINICAL DATA:  53 year old female status post right thoracentesis. History of breast cancer. EXAM: CHEST  1 VIEW COMPARISON:  Chest radiograph dated 07/01/2019  and CT dated 07/03/2019 FINDINGS: Interval decrease in the size of the right-sided pleural effusion. Small residual right pleural effusion remains. There is a small left pleural effusion, greater than right. There are bibasilar atelectasis. Pneumonia is not excluded. Right upper lobe interstitial coarsening and bronchitic changes. There is no pneumothorax. Right-sided Port-A-Cath with tip in stable position. Stable cardiac silhouette. No acute osseous pathology. IMPRESSION: 1. Interval decrease in the size of the right pleural effusion. No pneumothorax. 2. Small left pleural effusion and bibasilar atelectasis. Electronically Signed   By: Anner Crete M.D.   On: 07/03/2019 16:24   Dg Abd 1 View  Result Date: 06/24/2019 CLINICAL DATA:  Nasogastric tube placement EXAM: ABDOMEN - 1 VIEW COMPARISON:  None. FINDINGS: Tip and side port of the nasogastric tube project over the stomach. Nonobstructive bowel gas pattern. IMPRESSION: Nasogastric  tube tip in the stomach. Electronically Signed   By: Ulyses Jarred M.D.   On: 06/24/2019 03:36   Dg Abd 1 View  Result Date: 06/23/2019 CLINICAL DATA:  Enteric tube placement EXAM: ABDOMEN - 1 VIEW COMPARISON:  Abdominal radiograph from earlier today FINDINGS: Enteric tube terminates in the distal stomach. Persistent dilated small bowel loops up to the 4.3 cm in the left abdomen. No evidence of pneumatosis or pneumoperitoneum. Small right pleural effusion appears unchanged. Patchy bibasilar lung opacities appear unchanged. No radiopaque nephrolithiasis. IMPRESSION: 1. Enteric tube terminates in the distal stomach. 2. Persistent dilated small bowel loops in the left abdomen compatible with mid to distal small bowel obstruction. 3. Stable small right pleural effusion and patchy bibasilar lung opacities. Electronically Signed   By: Ilona Sorrel M.D.   On: 06/23/2019 11:49   Dg Abd 1 View  Result Date: 06/23/2019 CLINICAL DATA:  Nasogastric tube placed EXAM: ABDOMEN - 1  VIEW COMPARISON:  None. FINDINGS: Nasogastric tube with the tip projecting over the body of the stomach. There is no bowel dilatation to suggest obstruction. There is no evidence of pneumoperitoneum, portal venous gas or pneumatosis. There are no pathologic calcifications along the expected course of the ureters. The osseous structures are unremarkable. IMPRESSION: Nasogastric tube with the tip projecting over the body of the stomach. Electronically Signed   By: Kathreen Devoid   On: 06/23/2019 09:47   Dg Abd 1 View  Result Date: 06/22/2019 CLINICAL DATA:  NG tube placement EXAM: ABDOMEN - 1 VIEW COMPARISON:  None. FINDINGS: Enteric tube looped in the stomach with its tip in the distal gastric body. Nonobstructive bowel gas pattern. Visualized osseous structures are within normal limits. IMPRESSION: Enteric tube with its tip in the distal gastric body. Electronically Signed   By: Julian Hy M.D.   On: 06/22/2019 13:43   Ct Head Wo Contrast  Result Date: 06/27/2019 CLINICAL DATA:  53 year old female with metastatic breast cancer. Known spinal cord involvement. Encephalopathy. EXAM: CT HEAD WITHOUT CONTRAST TECHNIQUE: Contiguous axial images were obtained from the base of the skull through the vertex without intravenous contrast. COMPARISON:  None. FINDINGS: Brain: Partially empty sella. No midline shift, ventriculomegaly, mass effect, evidence of mass lesion, intracranial hemorrhage or evidence of cortically based acute infarction. Gray-white matter differentiation is within normal limits throughout the brain. No contrast administered. Vascular: No suspicious intracranial vascular hyperdensity. Skull: Hyperostosis of the calvarium with no destructive or suspicious skull lesion identified. Sinuses/Orbits: Partially visible left maxillary sinus opacification but the other visualized paranasal sinuses and mastoids are clear. Other: Visualized orbits and scalp soft tissues are within normal limits.  IMPRESSION: 1. Negative noncontrast CT appearance of the brain. Brain MRI without and with contrast would be most sensitive for early metastatic disease. 2. No skull metastasis identified. Partially visible opacification of the left maxillary sinus. Electronically Signed   By: Genevie Ann M.D.   On: 06/27/2019 12:23   Ct Angio Chest Pe W Or Wo Contrast  Result Date: 07/03/2019 CLINICAL DATA:  Chest pain, shortness of breath. EXAM: CT ANGIOGRAPHY CHEST WITH CONTRAST TECHNIQUE: Multidetector CT imaging of the chest was performed using the standard protocol during bolus administration of intravenous contrast. Multiplanar CT image reconstructions and MIPs were obtained to evaluate the vascular anatomy. CONTRAST:  163mL OMNIPAQUE IOHEXOL 350 MG/ML SOLN COMPARISON:  Abdominal CT study of 06/21/2019 FINDINGS: Cardiovascular: Thoracic aorta is normal. Branch vessels in the chest are patent. Central pulmonary arteries are engorged. No signs of filling defect  to suggest pulmonary embolism. Heart size is mildly enlarged. No signs significant pericardial effusion. Mediastinum/Nodes: No signs of mediastinal adenopathy. No signs of hilar mass. No axillary adenopathy. Lungs/Pleura: Large bilateral pleural effusions. Upper lobes and right middle lobe with aeration. Bilateral lower lobar collapse. Partial volume loss also and right upper lobe. No signs of pneumothorax. Upper lobe along the medial right chest, right paramediastinal border a 2.3 x 2.5 cm area of hypodensity is contiguous with areas of volume loss in the chest. Upper Abdomen: Signs of ascites and peritoneal nodularity with similar appearance to prior exam. Small [place] lesions and signs of portal to hepatic venous shunting in the liver similar to previous exam. No new findings of note in the upper abdomen based on comparison with recent abdominal CT. Musculoskeletal: No signs of acute bone process or destructive bone lesion. Evidence of lower thoracic laminectomy.  Review of the MIP images confirms the above findings. IMPRESSION: No signs of acute pulmonary embolism. Enlarging bilateral effusions as compared to study of 06/21/2019. Effusions are markedly enlarged with partial aeration of lung in the right middle and upper lobe and in the left upper lobe. Potential mass versus is pneumonia along the right mediastinal border. Aortic Atherosclerosis (ICD10-I70.0). Electronically Signed   By: Zetta Bills M.D.   On: 07/03/2019 11:02   Mr Jeri Cos [id number] Contrast  Result Date: 06/29/2019 CLINICAL DATA:  Encephalopathy.  History of breast cancer. EXAM: MRI HEAD WITHOUT AND WITH CONTRAST TECHNIQUE: Multiplanar, multiecho pulse sequences of the brain and surrounding structures were obtained without and with intravenous contrast. CONTRAST:  83mL GADAVIST GADOBUTROL 1 MMOL/ML IV SOLN COMPARISON:  Head CT 06/27/2019 FINDINGS: Brain: There is no evidence of acute infarct, intracranial hemorrhage, mass, midline shift, or extra-axial fluid collection. The ventricles and sulci are within normal limits for age. No significant cerebral white matter disease is evident. No abnormal enhancement is identified. There is a mildly expanded partially empty sella. Vascular: Major intracranial vascular flow voids are preserved. Skull and upper cervical spine: Unremarkable bone marrow signal. Sinuses/Orbits: Unremarkable orbits. Completely opacified and mildly expanded left maxillary sinus. Other paranasal sinuses and mastoid air cells are clear. Other: None. IMPRESSION: 1. No evidence of acute intracranial abnormality or intracranial metastases. 2. Partially empty sella. Electronically Signed   By: Logan Bores M.D.   On: 06/29/2019 14:45   Ct Abdomen Pelvis W Contrast  Result Date: 06/21/2019 CLINICAL DATA:  Abdominal pain, fever, metastatic breast cancer, peritoneal carcinoma EXAM: CT ABDOMEN AND PELVIS WITH CONTRAST TECHNIQUE: Multidetector CT imaging of the abdomen and pelvis was performed  using the standard protocol following bolus administration of intravenous contrast. CONTRAST:  188mL OMNIPAQUE IOHEXOL 300 MG/ML SOLN, 75mL OMNIPAQUE IOHEXOL 300 MG/ML SOLN COMPARISON:  None. FINDINGS: Lower chest: Moderate right, small left pleural effusions with associated atelectasis or consolidation. No definite pulmonary nodules or pleural nodularity noted. Hepatobiliary: Numerous low-attenuation liver lesions of varying sizes, incompletely characterized and possibly representing cysts versus metastases. No gallstones, gallbladder wall thickening, or biliary dilatation. Pancreas: Unremarkable. No pancreatic ductal dilatation or surrounding inflammatory changes. Spleen: Normal in size without significant abnormality. Adrenals/Urinary Tract: Adrenal glands are unremarkable. Kidneys are normal, without renal calculi, solid lesion, or hydronephrosis. Foley catheter in the decompressed urinary bladder. Stomach/Bowel: The stomach is grossly fluid distended. The small bowel is fluid distended, largest loops measuring 3.9 cm, with overt fecalization of the most distal small bowel and a tightly knuckled transition point in the central right lower quadrant (series 2, image 63). There is gas  and stool within the colon to the rectum. Vascular/Lymphatic: No significant vascular findings are present. No enlarged abdominal or pelvic lymph nodes. Reproductive: Large uterine fibroid. Other: No abdominal wall hernia or abnormality. Moderate volume ascites with diffuse peritoneal and omental nodularity, consistent with carcinomatosis. Musculoskeletal: Status post laminectomy of T11 and T12. IMPRESSION: 1. The stomach is grossly fluid distended. The small bowel is fluid distended, largest loops measuring 3.9 cm, with overt fecalization of the most distal small bowel and a tightly knuckled transition point in the central right lower quadrant (series 2, image 63). Findings are consistent with distal small bowel obstruction, which  may be incomplete given the presence of gas and stool in the colon and rectum. 2. Moderate volume ascites with diffuse peritoneal and omental nodularity, consistent with carcinomatosis. 3. Numerous low-attenuation liver lesions of varying sizes, incompletely characterized and possibly representing cysts versus metastases. 4. Moderate right, small left pleural effusions with associated atelectasis or consolidation, presumably malignant. No definite pulmonary nodules or pleural nodularity noted. 5. Status post laminectomy of T11 and T12. 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 These results will be called to the ordering clinician or representative by the Radiologist Assistant, and communication documented in the PACS or zVision Dashboard. Electronically Signed   By: Eddie Candle M.D.   On: 06/21/2019 17:49   US Paracentesis  Result Date: 06/27/2019 INDICATION: Patient with history of metastatic breast cancer, small-bowel obstruction, fever, encephalopathy, PE, cauda equina syndrome, ascites. Request made for diagnostic and therapeutic paracentesis. EXAM: ULTRASOUND GUIDED DIAGNOSTIC AND THERAPEUTIC PARACENTESIS MEDICATIONS: None COMPLICATIONS: None immediate. PROCEDURE: Informed written consent was obtained from the patient's husband after a discussion of the risks, benefits and alternatives to treatment. A timeout was performed prior to the initiation of the procedure. Initial ultrasound scanning demonstrates a small to moderate amount of ascites within the left mid to lower abdominal quadrant. The left mid to lower abdomen was prepped and draped in the usual sterile fashion. 1% lidocaine was used for local anesthesia. Following this, a 19 gauge, 7-cm, Yueh catheter was introduced. An ultrasound image was saved for documentation purposes. The paracentesis was performed. The catheter was removed and a dressing was applied.  The patient tolerated the procedure well without immediate post procedural complication. FINDINGS: A total of approximately 2.2 liters of yellow fluid was removed. Samples were sent to the laboratory as requested by the clinical team. IMPRESSION: Successful ultrasound-guided diagnostic and therapeutic paracentesis yielding 2.2 liters of peritoneal fluid. Read by: Rowe Robert, PA-C Electronically Signed   By: Jacqulynn Cadet M.D.   On: 06/27/2019 12:46   Dg Chest Port 1 View  Result Date: 07/01/2019 CLINICAL DATA:  Dyspnea EXAM: PORTABLE CHEST 1 VIEW COMPARISON:  06/20/2019 FINDINGS: Moderate right and small left pleural effusions. No frank interstitial edema. No pneumothorax. The heart is top-normal in size. Right chest port terminates the cavoatrial junction. IMPRESSION: Moderate right and small left pleural effusions. No frank interstitial edema. Electronically Signed   By: Julian Hy M.D.   On: 07/01/2019 08:05   Dg Chest Port 1 View  Result Date: 06/20/2019 CLINICAL DATA:  53 year old female with history of metastatic breast cancer. Patient is febrile and confused. EXAM: PORTABLE CHEST 1 VIEW COMPARISON:  No priors. FINDINGS: Low lung volumes. Opacity at the right base which may reflect atelectasis and/or consolidation. Small to moderate right pleural effusion. Left lung is clear. No left pleural effusion. No evidence of pulmonary  edema. Heart size is normal. Upper mediastinal contours are within normal limits. Right internal jugular single-lumen porta cath with tip terminating in the mid superior vena cava. IMPRESSION: 1. Atelectasis and/or consolidation in the right lung base with small to moderate right pleural effusion. Electronically Signed   By: Vinnie Langton M.D.   On: 06/20/2019 20:28   Dg Fluoro Guided Needle Plc Aspiration/injection Loc  Result Date: 06/27/2019 CLINICAL DATA:  History of breast cancer, fevers, altered mental status. EXAM: DIAGNOSTIC LUMBAR PUNCTURE UNDER  FLUOROSCOPIC GUIDANCE FLUOROSCOPY TIME:  Fluoroscopy Time:  30 seconds Radiation Exposure Index (if provided by the fluoroscopic device): 3.7 mGy Number of Acquired Spot Images: 0 PROCEDURE: Informed consent was obtained from the patient prior to the procedure, including potential complications of headache, allergy, and pain. With the patient prone, the lower back was prepped with Betadine. 1% Lidocaine was used for local anesthesia. Lumbar puncture was performed at the L2-L3 level using a 22 gauge needle with return of clear CSF with normal-appearing opening pressure. Ten ml of CSF were obtained for laboratory studies. The patient tolerated the procedure well and there were no apparent complications. IMPRESSION: 1. Technically successful lumbar puncture, 10 cc obtained and sent for requested testing. Electronically Signed   By: Zetta Bills M.D.   On: 06/27/2019 17:30   US Thoracentesis Asp Pleural Space W/img Guide  Result Date: 07/03/2019 INDICATION: Patient with history of metastatic breast cancer, dyspnea, bilateral pleural effusions right greater than left. Request received for diagnostic and therapeutic right thoracentesis. EXAM: ULTRASOUND GUIDED DIAGNOSTIC AND THERAPEUTIC RIGHT THORACENTESIS MEDICATIONS: None COMPLICATIONS: None immediate. PROCEDURE: An ultrasound guided thoracentesis was thoroughly discussed with the patient and questions answered. The benefits, risks, alternatives and complications were also discussed. The patient understands and wishes to proceed with the procedure. Written consent was obtained. Ultrasound was performed to localize and mark an adequate pocket of fluid in the right chest. The area was then prepped and draped in the normal sterile fashion. 1% Lidocaine was used for local anesthesia. Under ultrasound guidance a 6 Fr Safe-T-Centesis catheter was introduced. Thoracentesis was performed. The catheter was removed and a dressing applied. FINDINGS: A total of approximately  1.2 liters of hazy,amber fluid was removed. Samples were sent to the laboratory as requested by the clinical team. IMPRESSION: Successful ultrasound guided diagnostic and therapeutic right thoracentesis yielding 1.2 liters of pleural fluid. Read by: Rowe Robert, PA-C No pneumothorax on follow-up radiograph. Electronically Signed   By: Lucrezia Europe M.D.   On: 07/03/2019 16:04      The results of significant diagnostics from this hospitalization (including imaging, microbiology, ancillary and laboratory) are listed below for reference.     Microbiology: Recent Results (from the past 240 hour(s))  Culture, blood (Routine X 2) w Reflex to ID Panel     Status: None   Collection Time: 06/26/19  1:40 PM   Specimen: BLOOD RIGHT HAND  Result Value Ref Range Status   Specimen Description   Final    BLOOD RIGHT HAND Performed at [facility], [place] [address] [place], [place] [address]    Special Requests   Final    BOTTLES DRAWN AEROBIC ONLY Blood Culture results may not be optimal due to an inadequate volume of blood received in culture bottles Performed at [place] [address] [place], [place] [address]    Culture   Final    NO GROWTH 5 DAYS Performed at [facility], [place] [address],  [place], [place] [address]    Report Status 07/01/2019 FINAL  Final  Culture, blood (Routine X 2) w Reflex to ID Panel     Status: Abnormal   Collection Time: 06/26/19  1:40 PM   Specimen: BLOOD  Result Value Ref Range Status   Specimen Description   Final    BLOOD LEFT ANTECUBITAL Performed at [place] [address] [place], [place] [address]    Special Requests   Final    BOTTLES DRAWN AEROBIC ONLY Blood Culture results may not be optimal due to an inadequate volume of blood received in culture bottles Performed at [place] [address] [place], [place] [address]    Culture  Setup Time   Final     AEROBIC BOTTLE ONLY GRAM POSITIVE COCCI CRITICAL RESULT CALLED TO, READ BACK BY AND VERIFIED WITH: PHARMD [id number] AT 6 AM BY CM    Culture (A)  Final    STAPHYLOCOCCUS SPECIES (COAGULASE NEGATIVE) THE SIGNIFICANCE OF ISOLATING THIS ORGANISM FROM A SINGLE SET OF BLOOD CULTURES WHEN MULTIPLE SETS ARE DRAWN IS UNCERTAIN. PLEASE NOTIFY THE MICROBIOLOGY DEPARTMENT WITHIN ONE WEEK IF SPECIATION AND SENSITIVITIES ARE REQUIRED. Performed at [facility], [place] [address], [place], [place] [address]    Report Status 06/29/2019 FINAL  Final  Blood Culture ID Panel (Reflexed)     Status: Abnormal   Collection Time: 06/26/19  1:40 PM  Result Value Ref Range Status   Enterococcus species NOT DETECTED NOT DETECTED Final   Listeria monocytogenes NOT DETECTED NOT DETECTED Final   Staphylococcus species DETECTED (A) NOT DETECTED Final    Comment: Methicillin (oxacillin) resistant coagulase negative staphylococcus. Possible blood culture contaminant (unless isolated from more than one blood culture draw or clinical case suggests pathogenicity). No antibiotic treatment is indicated for blood  culture contaminants. CRITICAL RESULT CALLED TO, READ BACK BY AND VERIFIED WITH: PHARMD [id number] AT 69 AM BY CM    Staphylococcus aureus (BCID) NOT DETECTED NOT DETECTED Final   Methicillin resistance DETECTED (A) NOT DETECTED Final    Comment: CRITICAL RESULT CALLED TO, READ BACK BY AND VERIFIED WITH: PHARMD T GREEN [id number] AT 78 BY CM    Streptococcus species NOT DETECTED NOT DETECTED Final   Streptococcus agalactiae NOT DETECTED NOT DETECTED Final   Streptococcus pneumoniae NOT DETECTED NOT DETECTED Final   Streptococcus pyogenes NOT DETECTED NOT DETECTED Final   Acinetobacter baumannii NOT DETECTED NOT DETECTED Final   Enterobacteriaceae species NOT DETECTED NOT DETECTED Final   Enterobacter cloacae complex NOT DETECTED NOT DETECTED Final   Escherichia coli NOT DETECTED NOT DETECTED Final   Klebsiella  oxytoca NOT DETECTED NOT DETECTED Final   Klebsiella pneumoniae NOT DETECTED NOT DETECTED Final   Proteus species NOT DETECTED NOT DETECTED Final   Serratia marcescens NOT DETECTED NOT DETECTED Final   Haemophilus influenzae NOT DETECTED NOT DETECTED Final   Neisseria meningitidis NOT DETECTED NOT DETECTED Final   Pseudomonas aeruginosa NOT DETECTED NOT DETECTED Final   Candida albicans NOT DETECTED NOT DETECTED Final   Candida glabrata NOT DETECTED NOT DETECTED Final   Candida krusei NOT DETECTED NOT DETECTED Final   Candida parapsilosis NOT DETECTED NOT DETECTED Final   Candida tropicalis NOT DETECTED NOT DETECTED Final    Comment: Performed at [facility], [address], [place], [place] [address]  Fungus Culture With Stain     Status: None (Preliminary result)   Collection Time: 06/27/19 11:45 AM   Specimen: Abdomen;  Peritoneal Fluid  Result Value Ref Range Status   Fungus Stain Final report  Final    Comment: (NOTE) Performed At: [facility] [place], [place] [id number] Rush Farmer MD [id number]    Fungus (Mycology) Culture PENDING  Incomplete   Fungal Source ABDOMEN  Final    Comment: Performed at [facility], [place] [address] [place], [place] [address]  Acid Fast Smear (AFB)     Status: None   Collection Time: 06/27/19 11:45 AM   Specimen: Abdomen; Peritoneal Fluid  Result Value Ref Range Status   AFB Specimen Processing Comment  Final    Comment: Tissue Grinding and Digestion/Decontamination   Acid Fast Smear Negative  Final    Comment: (NOTE) Performed At: [facility] [id number] [place], [place] [id number] Rush Farmer MD [id number]    Source (AFB) ABDOMEN  Final    Comment: Performed at [facility], [place] [address], [place], [place] [address]  Culture, body fluid-bottle     Status: None   Collection Time: 06/27/19 11:45 AM   Specimen: Abdomen  Result Value Ref Range Status    Specimen Description ABDOMEN  Final   Special Requests NONE  Final   Culture   Final    NO GROWTH 5 DAYS Performed at [facility], [address] [place], [place] [address]    Report Status 07/02/2019 FINAL  Final  Gram stain     Status: None   Collection Time: 06/27/19 11:45 AM   Specimen: Abdomen  Result Value Ref Range Status   Specimen Description ABDOMEN  Final   Special Requests NONE  Final   Gram Stain   Final    RARE WBC PRESENT,BOTH PMN AND MONONUCLEAR NO ORGANISMS SEEN Performed at [facility], [place] [address], [place], [place] [address]    Report Status 06/27/2019 FINAL  Final  Fungus Culture Result     Status: None   Collection Time: 06/27/19 11:45 AM  Result Value Ref Range Status   Result 1 Comment  Final    Comment: (NOTE) KOH/Calcofluor preparation:  no fungus observed. Performed At: [facility] [place], [place] [id number] Rush Farmer MD [id number]   Anaerobic culture     Status: None   Collection Time: 06/27/19  4:00 PM   Specimen: PATH Cytology CSF; Cerebrospinal Fluid  Result Value Ref Range Status   Specimen Description   Final    CSF Performed at [place] [address] [place], [place] [address]    Special Requests   Final    NONE Performed at [facility], [place] [address] [place], [place] [address]    Culture   Final    NO ANAEROBES ISOLATED Performed at [facility], [place] [address] [place], [place] [address]    Report Status 07/02/2019 FINAL  Final  CSF culture     Status: None   Collection Time: 06/27/19  4:00 PM   Specimen: PATH Cytology CSF; Cerebrospinal Fluid  Result Value Ref Range Status   Specimen Description   Final    CSF Performed at [place] [address] [place],  [address]    Special Requests   Final    NONE Performed at [facility], [place] [address] [place], [place]  [address]    Gram Stain   Final    WBC PRESENT,BOTH PMN AND MONONUCLEAR NO ORGANISMS SEEN CYTOSPIN SMEAR  Culture   Final    NO GROWTH 3 DAYS Performed at [facility], [place] [address], [place], [place] [address]    Report Status 06/30/2019 FINAL  Final  Culture, fungus without smear     Status: None (Preliminary result)   Collection Time: 06/27/19  4:00 PM   Specimen: PATH Cytology CSF; Cerebrospinal Fluid  Result Value Ref Range Status   Specimen Description   Final    CSF Performed at [place] [address] [place], [place] [address]    Special Requests   Final    NONE Performed at [facility], [place] [address] [place], [place] [address]    Culture   Final    NO FUNGUS ISOLATED AFTER 7 DAYS Performed at [facility], [place] [address] [place], [place] [address]    Report Status PENDING  Incomplete  Body fluid culture     Status: None (Preliminary result)   Collection Time: 07/03/19  3:44 PM   Specimen: Lung, Right; Pleural Fluid  Result Value Ref Range Status   Specimen Description   Final    PLEURAL  RIGHT LUNG Performed at [place] [address], [place], [place] [address]    Special Requests   Final    NONE Performed at [facility], [place] [address], [place], [place] [address]    Gram Stain   Final    RARE WBC PRESENT, PREDOMINANTLY PMN NO ORGANISMS SEEN    Culture   Final    NO GROWTH 2 DAYS Performed at [place] [address] [place],  [address]    Report Status PENDING  Incomplete     Labs: BNP (last 3 results) Recent Labs    07/01/19 0353  BNP [id number]   Basic Metabolic Panel: Recent Labs  Lab 07/01/19 0353 07/02/19 0450 07/03/19 0600 07/04/19 0333 07/05/19 0448  NA 140 140 140 139 138  K 4.2 4.2 3.8 3.8 3.6  CL 115* 115* 115* 111 108  CO2 17* 19* 18* 20* 21*  GLUCOSE 88 105* 84 102* 108*  BUN 21* 19 19 19  22*  CREATININE  0.77 0.72 0.70 0.75 0.92  CALCIUM 8.8* 8.9 8.7* 8.7* 8.7*  MG 1.8 1.8 1.7 1.8 1.6*   Liver Function Tests: Recent Labs  Lab 06/30/19 0545  AST 14*  ALT 12  ALKPHOS 84  BILITOT 0.6  PROT 5.1*  ALBUMIN 1.8*   No results for input(s): LIPASE, AMYLASE in the last 168 hours. No results for input(s): AMMONIA in the last 168 hours. CBC: Recent Labs  Lab 06/30/19 0545 07/01/19 0353 07/02/19 0450 07/03/19 0600 07/04/19 0333 07/05/19 0448  WBC 11.2* 9.3 9.6 10.1 9.3 6.7  NEUTROABS 9.5*  --   --   --   --   --   HGB 8.5* 8.5* 8.6* 8.6* 8.5* 7.8*  HCT 27.5* 27.3* 28.0* 27.5* 27.7* 24.8*  MCV 87.3 87.2 88.3 88.4 87.1 86.7  PLT 538* 556* 593* 552* 575* 610*   Cardiac Enzymes: No results for input(s): CKTOTAL, CKMB, CKMBINDEX, TROPONINI in the last 168 hours. BNP: Invalid input(s): POCBNP CBG: Recent Labs  Lab 07/04/19 1540 07/04/19 1951 07/04/19 2300 07/05/19 0405 07/05/19 0730  GLUCAP 151* 149* 116* 121* 109*   D-Dimer No results for input(s): DDIMER in the last 72 hours. Hgb A1c No results for input(s): HGBA1C in the last 72 hours. Lipid Profile No results for input(s): CHOL, HDL, LDLCALC, TRIG,  CHOLHDL, LDLDIRECT in the last 72 hours. Thyroid function studies No results for input(s): TSH, T4TOTAL, T3FREE, THYROIDAB in the last 72 hours.  Invalid input(s): FREET3 Anemia work up No results for input(s): VITAMINB12, FOLATE, FERRITIN, TIBC, IRON, RETICCTPCT in the last 72 hours. Urinalysis    Component Value Date/Time   COLORURINE YELLOW 06/20/2019 2032   APPEARANCEUR CLOUDY (A) 06/20/2019 2032   LABSPEC >1.030 (H) 06/20/2019 2032   PHURINE 5.0 06/20/2019 2032   GLUCOSEU NEGATIVE 06/20/2019 2032   HGBUR NEGATIVE 06/20/2019 2032   BILIRUBINUR SMALL (A) 06/20/2019 2032   [place] NEGATIVE 06/20/2019 2032   PROTEINUR 30 (A) 06/20/2019 2032   NITRITE NEGATIVE 06/20/2019 2032   LEUKOCYTESUR NEGATIVE 06/20/2019 2032   Sepsis Labs Invalid input(s): PROCALCITONIN,   WBC,  LACTICIDVEN Microbiology Recent Results (from the past 240 hour(s))  Culture, blood (Routine X 2) w Reflex to ID Panel     Status: None   Collection Time: 06/26/19  1:40 PM   Specimen: BLOOD RIGHT HAND  Result Value Ref Range Status   Specimen Description   Final    BLOOD RIGHT HAND Performed at [facility], [place] [address], [place], [place] [address]    Special Requests   Final    BOTTLES DRAWN AEROBIC ONLY Blood Culture results may not be optimal due to an inadequate volume of blood received in culture bottles Performed at [place] [address] [place], [place] [address]    Culture   Final    NO GROWTH 5 DAYS Performed at [facility], [place] [address] [place], [place] [address]    Report Status 07/01/2019 FINAL  Final  Culture, blood (Routine X 2) w Reflex to ID Panel     Status: Abnormal   Collection Time: 06/26/19  1:40 PM   Specimen: BLOOD  Result Value Ref Range Status   Specimen Description   Final    BLOOD LEFT ANTECUBITAL Performed at [place] [address] [place], [place] [address]    Special Requests   Final    BOTTLES DRAWN AEROBIC ONLY Blood Culture results may not be optimal due to an inadequate volume of blood received in culture bottles Performed at [place] [address], [place],  [address]    Culture  Setup Time   Final    AEROBIC BOTTLE ONLY GRAM POSITIVE COCCI CRITICAL RESULT CALLED TO, READ BACK BY AND VERIFIED WITH: PHARMD [id number] AT 74 AM BY CM    Culture (A)  Final    STAPHYLOCOCCUS SPECIES (COAGULASE NEGATIVE) THE SIGNIFICANCE OF ISOLATING THIS ORGANISM FROM A SINGLE SET OF BLOOD CULTURES WHEN MULTIPLE SETS ARE DRAWN IS UNCERTAIN. PLEASE NOTIFY THE MICROBIOLOGY DEPARTMENT WITHIN ONE WEEK IF SPECIATION AND SENSITIVITIES ARE REQUIRED. Performed at [facility], [place] [address] [place],  [address]    Report Status 06/29/2019  FINAL  Final  Blood Culture ID Panel (Reflexed)     Status: Abnormal   Collection Time: 06/26/19  1:40 PM  Result Value Ref Range Status   Enterococcus species NOT DETECTED NOT DETECTED Final   Listeria monocytogenes NOT DETECTED NOT DETECTED Final   Staphylococcus species DETECTED (A) NOT DETECTED Final    Comment: Methicillin (oxacillin) resistant coagulase negative staphylococcus. Possible blood culture contaminant (unless isolated from more than one blood culture draw or clinical case suggests pathogenicity). No antibiotic treatment is indicated for blood  culture contaminants. CRITICAL RESULT CALLED TO, READ BACK BY AND  VERIFIED WITH: PHARMD [id number] AT 703 AM BY CM    Staphylococcus aureus (BCID) NOT DETECTED NOT DETECTED Final   Methicillin resistance DETECTED (A) NOT DETECTED Final    Comment: CRITICAL RESULT CALLED TO, READ BACK BY AND VERIFIED WITH: PHARMD T GREEN [id number] AT 703 BY CM    Streptococcus species NOT DETECTED NOT DETECTED Final   Streptococcus agalactiae NOT DETECTED NOT DETECTED Final   Streptococcus pneumoniae NOT DETECTED NOT DETECTED Final   Streptococcus pyogenes NOT DETECTED NOT DETECTED Final   Acinetobacter baumannii NOT DETECTED NOT DETECTED Final   Enterobacteriaceae species NOT DETECTED NOT DETECTED Final   Enterobacter cloacae complex NOT DETECTED NOT DETECTED Final   Escherichia coli NOT DETECTED NOT DETECTED Final   Klebsiella oxytoca NOT DETECTED NOT DETECTED Final   Klebsiella pneumoniae NOT DETECTED NOT DETECTED Final   Proteus species NOT DETECTED NOT DETECTED Final   Serratia marcescens NOT DETECTED NOT DETECTED Final   Haemophilus influenzae NOT DETECTED NOT DETECTED Final   Neisseria meningitidis NOT DETECTED NOT DETECTED Final   Pseudomonas aeruginosa NOT DETECTED NOT DETECTED Final   Candida albicans NOT DETECTED NOT DETECTED Final   Candida glabrata NOT DETECTED NOT DETECTED Final   Candida krusei NOT DETECTED NOT DETECTED Final   Candida  parapsilosis NOT DETECTED NOT DETECTED Final   Candida tropicalis NOT DETECTED NOT DETECTED Final    Comment: Performed at [facility], [address] [place], [place] [address]  Fungus Culture With Stain     Status: None (Preliminary result)   Collection Time: 06/27/19 11:45 AM   Specimen: Abdomen; Peritoneal Fluid  Result Value Ref Range Status   Fungus Stain Final report  Final    Comment: (NOTE) Performed At: [facility] [place], [place] [id number] Rush Farmer MD [id number]    Fungus (Mycology) Culture PENDING  Incomplete   Fungal Source ABDOMEN  Final    Comment: Performed at [facility], [place] [address] [place], [place] [address]  Acid Fast Smear (AFB)     Status: None   Collection Time: 06/27/19 11:45 AM   Specimen: Abdomen; Peritoneal Fluid  Result Value Ref Range Status   AFB Specimen Processing Comment  Final    Comment: Tissue Grinding and Digestion/Decontamination   Acid Fast Smear Negative  Final    Comment: (NOTE) Performed At: [facility] [id number] [place], [place] [id number] Rush Farmer MD [id number]    Source (AFB) ABDOMEN  Final    Comment: Performed at [facility], [place] [address], [place], [place] [address]  Culture, body fluid-bottle     Status: None   Collection Time: 06/27/19 11:45 AM   Specimen: Abdomen  Result Value Ref Range Status   Specimen Description ABDOMEN  Final   Special Requests NONE  Final   Culture   Final    NO GROWTH 5 DAYS Performed at [facility], [address] [place], [place] [address]    Report Status 07/02/2019 FINAL  Final  Gram stain     Status: None   Collection Time: 06/27/19 11:45 AM   Specimen: Abdomen  Result Value Ref Range Status   Specimen Description ABDOMEN  Final   Special Requests NONE  Final   Gram Stain   Final    RARE WBC PRESENT,BOTH PMN AND MONONUCLEAR NO ORGANISMS SEEN Performed at [facility], [place] [address], [place], [place] [address]    Report Status 06/27/2019 FINAL  Final  Fungus Culture Result     Status: None   Collection Time: 06/27/19 11:45 AM  Result Value Ref Range Status   Result 1 Comment  Final    Comment: (NOTE) KOH/Calcofluor preparation:  no fungus observed. Performed At: [facility] [place], [place] [id number] Rush Farmer MD [id number]   Anaerobic culture     Status: None   Collection Time: 06/27/19  4:00 PM   Specimen: PATH Cytology CSF; Cerebrospinal Fluid  Result Value Ref Range Status   Specimen Description   Final    CSF Performed at [place] [address], [place], [place] [address]    Special Requests   Final    NONE Performed at [facility], [place] [address] [place], [place] [address]    Culture   Final    NO ANAEROBES ISOLATED Performed at [facility], [place] [address] [place], [place] [address]    Report Status 07/02/2019 FINAL  Final  CSF culture     Status: None   Collection Time: 06/27/19  4:00 PM   Specimen: PATH Cytology CSF; Cerebrospinal Fluid  Result Value Ref Range Status   Specimen Description   Final    CSF Performed at [place] [address] [place], [place] [address]    Special Requests   Final    NONE Performed at [facility], [place] [address], [place], [place] [address]    Gram Stain   Final    WBC PRESENT,BOTH PMN AND MONONUCLEAR NO ORGANISMS SEEN CYTOSPIN SMEAR    Culture   Final    NO GROWTH 3 DAYS Performed at [facility], [place] [address] [place], [place] [address]    Report Status 06/30/2019 FINAL  Final  Culture, fungus without smear     Status: None (Preliminary result)   Collection Time: 06/27/19  4:00 PM   Specimen: PATH Cytology CSF; Cerebrospinal Fluid  Result Value Ref Range Status   Specimen Description   Final    CSF Performed at [place] [address], [place], [place] [address]    Special Requests   Final    NONE Performed at [facility], [place] [address], [place], [place] [address]    Culture   Final    NO FUNGUS ISOLATED AFTER 7 DAYS Performed at [facility], [place] [address] [place], [place] [address]    Report Status PENDING  Incomplete  Body fluid culture     Status: None (Preliminary result)   Collection Time: 07/03/19  3:44 PM   Specimen: Lung, Right; Pleural Fluid  Result Value Ref Range Status   Specimen Description   Final    PLEURAL  RIGHT LUNG Performed at [place] [address], [place], [place] [address]    Special Requests   Final    NONE Performed at [facility], [place] [address], [place], [place] [address]    Gram Stain   Final    RARE WBC PRESENT, PREDOMINANTLY PMN NO ORGANISMS SEEN    Culture   Final    NO GROWTH 2 DAYS Performed at [place] [address], [place], [place] [address]    Report Status PENDING  Incomplete     Time coordinating discharge:  I have spent 35 minutes face to face with the patient and on the ward discussing the patients care, assessment, plan  and disposition with other care givers. >50% of the time was devoted counseling the patient about the risks and benefits of treatment/Discharge disposition and coordinating care.   SIGNED:   Damita Lack, MD  [facility] 07/05/2019, 11:55 AM   If 7PM-7AM, please contact night-coverage

## 2019-07-05 NOTE — Progress Notes (Addendum)
Right chest port de accessed by IV team. Pt and pts husband given discharge instructions, instructions reviewed verbally with pt and pts husband, both voiced understanding. Pt taken out via wheelchair with husband,

## 2019-07-05 NOTE — Plan of Care (Signed)
Theodoro Doing RN

## 2019-07-07 LAB — BODY FLUID CULTURE: Culture: NO GROWTH

## 2019-07-11 ENCOUNTER — Encounter (HOSPITAL_COMMUNITY): Payer: Self-pay

## 2019-07-11 ENCOUNTER — Inpatient Hospital Stay (HOSPITAL_COMMUNITY)
Admission: EM | Admit: 2019-07-11 | Discharge: 2019-07-13 | DRG: 871 | Disposition: A | Discharge status: Hospice | Payer: BC Managed Care – PPO | Attending: Internal Medicine | Admitting: Internal Medicine

## 2019-07-11 ENCOUNTER — Other Ambulatory Visit: Payer: Self-pay

## 2019-07-11 ENCOUNTER — Emergency Department (HOSPITAL_COMMUNITY): Payer: BC Managed Care – PPO

## 2019-07-11 DIAGNOSIS — G834 Cauda equina syndrome: Secondary | ICD-10-CM | POA: Diagnosis present

## 2019-07-11 DIAGNOSIS — A419 Sepsis, unspecified organism: Principal | ICD-10-CM | POA: Diagnosis present

## 2019-07-11 DIAGNOSIS — J90 Pleural effusion, not elsewhere classified: Secondary | ICD-10-CM | POA: Diagnosis present

## 2019-07-11 DIAGNOSIS — Z9221 Personal history of antineoplastic chemotherapy: Secondary | ICD-10-CM

## 2019-07-11 DIAGNOSIS — D696 Thrombocytopenia, unspecified: Secondary | ICD-10-CM | POA: Diagnosis present

## 2019-07-11 DIAGNOSIS — C50919 Malignant neoplasm of unspecified site of unspecified female breast: Secondary | ICD-10-CM | POA: Diagnosis present

## 2019-07-11 DIAGNOSIS — Z6829 Body mass index (BMI) 29.0-29.9, adult: Secondary | ICD-10-CM | POA: Diagnosis not present

## 2019-07-11 DIAGNOSIS — E860 Dehydration: Secondary | ICD-10-CM | POA: Diagnosis not present

## 2019-07-11 DIAGNOSIS — R0689 Other abnormalities of breathing: Secondary | ICD-10-CM | POA: Diagnosis not present

## 2019-07-11 DIAGNOSIS — C7970 Secondary malignant neoplasm of unspecified adrenal gland: Secondary | ICD-10-CM | POA: Diagnosis not present

## 2019-07-11 DIAGNOSIS — Z86711 Personal history of pulmonary embolism: Secondary | ICD-10-CM

## 2019-07-11 DIAGNOSIS — I959 Hypotension, unspecified: Secondary | ICD-10-CM

## 2019-07-11 DIAGNOSIS — D630 Anemia in neoplastic disease: Secondary | ICD-10-CM | POA: Diagnosis present

## 2019-07-11 DIAGNOSIS — Z79891 Long term (current) use of opiate analgesic: Secondary | ICD-10-CM | POA: Diagnosis not present

## 2019-07-11 DIAGNOSIS — Z66 Do not resuscitate: Secondary | ICD-10-CM | POA: Diagnosis not present

## 2019-07-11 DIAGNOSIS — D649 Anemia, unspecified: Secondary | ICD-10-CM | POA: Diagnosis present

## 2019-07-11 DIAGNOSIS — E43 Unspecified severe protein-calorie malnutrition: Secondary | ICD-10-CM | POA: Diagnosis present

## 2019-07-11 DIAGNOSIS — G9341 Metabolic encephalopathy: Secondary | ICD-10-CM | POA: Diagnosis not present

## 2019-07-11 DIAGNOSIS — R0902 Hypoxemia: Secondary | ICD-10-CM | POA: Diagnosis not present

## 2019-07-11 DIAGNOSIS — R188 Other ascites: Secondary | ICD-10-CM | POA: Diagnosis not present

## 2019-07-11 DIAGNOSIS — R52 Pain, unspecified: Secondary | ICD-10-CM | POA: Diagnosis not present

## 2019-07-11 DIAGNOSIS — Z79899 Other long term (current) drug therapy: Secondary | ICD-10-CM

## 2019-07-11 DIAGNOSIS — N390 Urinary tract infection, site not specified: Secondary | ICD-10-CM | POA: Diagnosis present

## 2019-07-11 DIAGNOSIS — Z20828 Contact with and (suspected) exposure to other viral communicable diseases: Secondary | ICD-10-CM | POA: Diagnosis present

## 2019-07-11 DIAGNOSIS — Z853 Personal history of malignant neoplasm of breast: Secondary | ICD-10-CM | POA: Diagnosis not present

## 2019-07-11 DIAGNOSIS — Z515 Encounter for palliative care: Secondary | ICD-10-CM | POA: Diagnosis not present

## 2019-07-11 DIAGNOSIS — C799 Secondary malignant neoplasm of unspecified site: Secondary | ICD-10-CM | POA: Diagnosis not present

## 2019-07-11 DIAGNOSIS — N179 Acute kidney failure, unspecified: Secondary | ICD-10-CM | POA: Diagnosis present

## 2019-07-11 DIAGNOSIS — Z7901 Long term (current) use of anticoagulants: Secondary | ICD-10-CM | POA: Diagnosis not present

## 2019-07-11 DIAGNOSIS — R1110 Vomiting, unspecified: Secondary | ICD-10-CM | POA: Diagnosis not present

## 2019-07-11 DIAGNOSIS — R509 Fever, unspecified: Secondary | ICD-10-CM | POA: Diagnosis not present

## 2019-07-11 DIAGNOSIS — R627 Adult failure to thrive: Secondary | ICD-10-CM | POA: Diagnosis present

## 2019-07-11 DIAGNOSIS — I10 Essential (primary) hypertension: Secondary | ICD-10-CM | POA: Diagnosis present

## 2019-07-11 DIAGNOSIS — L89152 Pressure ulcer of sacral region, stage 2: Secondary | ICD-10-CM | POA: Diagnosis present

## 2019-07-11 DIAGNOSIS — R531 Weakness: Secondary | ICD-10-CM | POA: Diagnosis present

## 2019-07-11 DIAGNOSIS — C786 Secondary malignant neoplasm of retroperitoneum and peritoneum: Secondary | ICD-10-CM | POA: Diagnosis present

## 2019-07-11 DIAGNOSIS — Z820 Family history of epilepsy and other diseases of the nervous system: Secondary | ICD-10-CM

## 2019-07-11 DIAGNOSIS — R000 Tachycardia, unspecified: Secondary | ICD-10-CM | POA: Diagnosis not present

## 2019-07-11 LAB — CBC WITH DIFFERENTIAL/PLATELET
Abs Immature Granulocytes: 0.06 10*3/uL (ref 0.00–0.07)
Basophils Absolute: 0 10*3/uL (ref 0.0–0.1)
Basophils Relative: 0 %
Eosinophils Absolute: 0 10*3/uL (ref 0.0–0.5)
Eosinophils Relative: 0 %
HCT: 23 % — ABNORMAL LOW (ref 36.0–46.0)
Hemoglobin: 7.1 g/dL — ABNORMAL LOW (ref 12.0–15.0)
Immature Granulocytes: 1 %
Lymphocytes Relative: 3 %
Lymphs Abs: 0.3 10*3/uL — ABNORMAL LOW (ref 0.7–4.0)
MCH: 26.9 pg (ref 26.0–34.0)
MCHC: 30.9 g/dL (ref 30.0–36.0)
MCV: 87.1 fL (ref 80.0–100.0)
Monocytes Absolute: 0.3 10*3/uL (ref 0.1–1.0)
Monocytes Relative: 4 %
Neutro Abs: 7.3 10*3/uL (ref 1.7–7.7)
Neutrophils Relative %: 92 %
Platelets: 139 10*3/uL — ABNORMAL LOW (ref 150–400)
RBC: 2.64 MIL/uL — ABNORMAL LOW (ref 3.87–5.11)
RDW: 21 % — ABNORMAL HIGH (ref 11.5–15.5)
WBC: 7.9 10*3/uL (ref 4.0–10.5)
nRBC: 0.5 % — ABNORMAL HIGH (ref 0.0–0.2)

## 2019-07-11 LAB — URINALYSIS, ROUTINE W REFLEX MICROSCOPIC
Bilirubin Urine: NEGATIVE
Glucose, UA: NEGATIVE mg/dL
Ketones, ur: NEGATIVE mg/dL
Nitrite: NEGATIVE
Protein, ur: NEGATIVE mg/dL
Specific Gravity, Urine: 1.021 (ref 1.005–1.030)
pH: 5 (ref 5.0–8.0)

## 2019-07-11 LAB — LACTIC ACID, PLASMA
Lactic Acid, Venous: 1.7 mmol/L (ref 0.5–1.9)
Lactic Acid, Venous: 1.1 mmol/L (ref 0.5–1.9)

## 2019-07-11 LAB — COMPREHENSIVE METABOLIC PANEL
ALT: 69 U/L — ABNORMAL HIGH (ref 0–44)
AST: 143 U/L — ABNORMAL HIGH (ref 15–41)
Albumin: 1.4 g/dL — ABNORMAL LOW (ref 3.5–5.0)
Alkaline Phosphatase: 106 U/L (ref 38–126)
Anion gap: 10 (ref 5–15)
BUN: 51 mg/dL — ABNORMAL HIGH (ref 6–20)
CO2: 23 mmol/L (ref 22–32)
Calcium: 8 mg/dL — ABNORMAL LOW (ref 8.9–10.3)
Chloride: 106 mmol/L (ref 98–111)
Creatinine, Ser: 1.75 mg/dL — ABNORMAL HIGH (ref 0.44–1.00)
GFR calc Af Amer: 38 mL/min — ABNORMAL LOW (ref >60)
GFR calc non Af Amer: 33 mL/min — ABNORMAL LOW (ref >60)
Glucose, Bld: 88 mg/dL (ref 70–99)
Potassium: 3.9 mmol/L (ref 3.5–5.1)
Sodium: 139 mmol/L (ref 135–145)
Total Bilirubin: 0.1 mg/dL — ABNORMAL LOW (ref 0.3–1.2)
Total Protein: 4.5 g/dL — ABNORMAL LOW (ref 6.5–8.1)

## 2019-07-11 LAB — PROTIME-INR
INR: 2.8 — ABNORMAL HIGH (ref 0.8–1.2)
Prothrombin Time: 29 seconds — ABNORMAL HIGH (ref 11.4–15.2)

## 2019-07-11 LAB — APTT: aPTT: 43 seconds — ABNORMAL HIGH (ref 24–36)

## 2019-07-11 LAB — PREGNANCY, URINE: Preg Test, Ur: NEGATIVE

## 2019-07-11 LAB — I-STAT BETA HCG BLOOD, ED (MC, WL, AP ONLY): I-stat hCG, quantitative: 13.9 m[IU]/mL — ABNORMAL HIGH (ref <5)

## 2019-07-11 MED ORDER — VANCOMYCIN HCL 10 G IV SOLR
1250.0000 mg | Freq: Once | INTRAVENOUS | Status: AC
  Administered 2019-07-11: 20:00:00 1250 mg via INTRAVENOUS
  Filled 2019-07-11: qty 1250

## 2019-07-11 MED ORDER — METRONIDAZOLE IN NACL 5-0.79 MG/ML-% IV SOLN
500.0000 mg | Freq: Once | INTRAVENOUS | Status: AC
  Administered 2019-07-11: 18:00:00 500 mg via INTRAVENOUS
  Filled 2019-07-11: qty 100

## 2019-07-11 MED ORDER — SODIUM CHLORIDE 0.9 % IV BOLUS (SEPSIS)
1000.0000 mL | Freq: Once | INTRAVENOUS | Status: AC
  Administered 2019-07-11: 1000 mL via INTRAVENOUS

## 2019-07-11 MED ORDER — VANCOMYCIN HCL IN DEXTROSE 1-5 GM/200ML-% IV SOLN: 1000.0000 mg | Freq: Once | INTRAVENOUS | Status: DC

## 2019-07-11 MED ORDER — SODIUM CHLORIDE 0.9 % IV SOLN
2.0000 g | Freq: Once | INTRAVENOUS | Status: AC
  Administered 2019-07-11: 2 g via INTRAVENOUS
  Filled 2019-07-11: qty 2

## 2019-07-11 MED ORDER — SODIUM CHLORIDE 0.9 % IV BOLUS (SEPSIS)
1000.0000 mL | Freq: Once | INTRAVENOUS | Status: AC
  Administered 2019-07-11: 18:00:00 1000 mL via INTRAVENOUS

## 2019-07-11 NOTE — ED Notes (Signed)
Husband called for an update, gave him room number upstairs and update.

## 2019-07-11 NOTE — H&P (Signed)
History and Physical   Kaylee Randolph:[id number] DOB: Mar 11, 1966 DOA: 07/11/2019  Referring MD/NP/PA: Dr. Darl Householder  PCP: Maury Dus, MD   Outpatient Specialists: [facility]  Patient coming from: Home  Chief Complaint: Weakness  HPI: Kaylee Randolph is a 53 y.o. female with medical history significant of metastatic breast cancer, hypertension, sacral decubitus ulcer, who presented with progressive weakness and poor appetite for the last week.  She has had poor oral intake at least in the last 2 days.  Noted worsening swelling.  She had a home health nurse that comes out regularly.  She saw today on she was weak breathing hard and decided to advise her to come to the emergency room.  Patient was found to be hypotensive and tachycardic.  She was visibly dehydrated.  She is also pale.  Denied any melena denied any bright red blood per rectum.  She is chronically on Xarelto.  Patient's hemoglobin was found to be 7.1.  Also evidence of UTI.  She denied fever or chills.  She has been weak and has been following L consistently with her oncologist.  Patient has been admitted with acute kidney injury, dehydration and UTI.  ED Course: Temperature is 99.1 blood pressure 86/50 pulse 123 respirate of 32 oxygen sat 93% on room air.  Sodium 139 potassium 3.9 chloride 106 CO2 23 glucose 88 BUN 51 creatinine 1.75 calcium 8.0.  Albumin is 1.4 lactic acid 1.7.  White count 7.9 hemoglobin 7.1 and platelets of 139.  Chest x-ray showed large bilateral pleural effusions left greater than right.  There is masslike thickening of the right paratracheal stripe similar in appearance to recent CT.  Patient is being admitted to the hospital with evidence of UTI from urinalysis.  Also acute kidney injury and dehydration  Review of Systems: As per HPI otherwise 10 point review of systems negative.    Past Medical History:  Diagnosis Date  . Hypertension   . met rt breast ca dx'd 03/2018    Past Surgical  History:  Procedure Laterality Date  . BREAST SURGERY       reports that she has never smoked. She has never used smokeless tobacco. She reports previous alcohol use. She reports that she does not use drugs.  No Known Allergies  Family History  Problem Relation Age of Onset  . Huntington's disease Mother   . Huntington's disease Sister      Prior to Admission medications   Medication Sig Start Date End Date Taking? Authorizing Provider  acetaminophen (TYLENOL) 500 MG tablet Take 1,000 mg by mouth every 6 (six) hours as needed for mild pain.   Yes [provider]  amLODipine (NORVASC) 5 MG tablet Take 1 tablet (5 mg total) by mouth daily. 07/05/19 08/04/19 Yes Amin, Ankit Chirag, MD  capecitabine (XELODA) 500 MG tablet Take 500 mg by mouth. 05/29/19   [provider]  dexamethasone (DECADRON) 2 MG tablet Take 0.5-2 mg by mouth.     [provider]  famotidine (PEPCID) 20 MG tablet Take 20 mg by mouth daily.    [provider]  furosemide (LASIX) 40 MG tablet Take 1 tablet (40 mg total) by mouth daily. 07/05/19 09/03/19  Amin, Jeanella Flattery, MD  gabapentin (NEURONTIN) 100 MG capsule Take 100 mg by mouth 3 (three) times daily.    [provider]  hydrochlorothiazide (MICROZIDE) 12.5 MG capsule Take 12.5 mg by mouth daily.    [provider]  hyoscyamine (LEVSIN) 0.125 MG tablet  Take 0.125-0.25 mg by mouth every 4 (four) hours as needed for cramping.    [provider]  losartan (COZAAR) 100 MG tablet Take 100 mg by mouth daily.    [provider]  metoprolol tartrate (LOPRESSOR) 50 MG tablet Take 1 tablet (50 mg total) by mouth 2 (two) times daily. 07/05/19 09/03/19  Amin, Jeanella Flattery, MD  mirtazapine (REMERON) 7.5 MG tablet Take 7.5 mg by mouth at bedtime.    [provider]  Multiple Vitamin (MULTIVITAMIN WITH MINERALS) TABS tablet Take 1 tablet by mouth daily. 07/05/19   Amin, Jeanella Flattery, MD  ondansetron  (ZOFRAN) 4 MG tablet Take 4 mg by mouth every 6 (six) hours as needed for nausea or vomiting.    [provider]  oxyCODONE (OXYCONTIN) 10 mg 12 hr tablet Take 10 mg by mouth 2 (two) times daily.    [provider]  pravastatin (PRAVACHOL) 40 MG tablet Take 40 mg by mouth daily.    [provider]  QUEtiapine (SEROQUEL) 25 MG tablet Take 1 tablet (25 mg total) by mouth daily. 07/05/19 09/03/19  Amin, Jeanella Flattery, MD  rivaroxaban (XARELTO) 20 MG TABS tablet Take 20 mg by mouth daily.    [provider]  tiZANidine (ZANAFLEX) 4 MG tablet Take 4 mg by mouth 3 (three) times daily.    [provider]    Physical Exam: Vitals:   07/11/19 2230 07/11/19 2233 07/11/19 2239 07/11/19 2241  BP: 97/64   101/66  Pulse: (!) 121 (!) 123 (!) 122 (!) 123  Resp: (!) 21   (!) 26  Temp:      TempSrc:      SpO2: 93% 95% 94% 94%      Constitutional: Chronically ill looking, Vitals:   07/11/19 2230 07/11/19 2233 07/11/19 2239 07/11/19 2241  BP: 97/64   101/66  Pulse: (!) 121 (!) 123 (!) 122 (!) 123  Resp: (!) 21   (!) 26  Temp:      TempSrc:      SpO2: 93% 95% 94% 94%   Eyes: PERRL, lids and conjunctivae pale  ENMT: Mucous membranes are moist. Posterior pharynx clear of any exudate or lesions.Normal dentition.  Neck: normal, supple, no masses, no thyromegaly Respiratory: clear to auscultation bilaterally, no wheezing, no crackles. Normal respiratory effort. No accessory muscle use.  Cardiovascular: Tachycardic,, no murmurs / rubs / gallops. No extremity edema. 2+ pedal pulses. No carotid bruits.  Abdomen: no tenderness, no masses palpated. No hepatosplenomegaly. Bowel sounds positive.  Musculoskeletal: no clubbing / cyanosis. No joint deformity upper and lower extremities. Good ROM, no contractures. Normal muscle tone.  Skin: no rashes, lesions, ulcers. No induration Neurologic: CN 2-12 grossly intact. Sensation intact, DTR normal. Strength 5/5 in all 4.   Psychiatric: Normal judgment and insight. Alert and oriented x 3. Normal mood.     Labs on Admission: I have personally reviewed following labs and imaging studies  CBC: Recent Labs  Lab 07/05/19 0448 07/11/19 1758  WBC 6.7 7.9  NEUTROABS  --  7.3  HGB 7.8* 7.1*  HCT 24.8* 23.0*  MCV 86.7 87.1  PLT 610* 989*   Basic Metabolic Panel: Recent Labs  Lab 07/05/19 0448 07/11/19 1758  NA 138 139  K 3.6 3.9  CL 108 106  CO2 21* 23  GLUCOSE 108* 88  BUN 22* 51*  CREATININE 0.92 1.75*  CALCIUM 8.7* 8.0*  MG 1.6*  --    GFR: CrCl cannot be calculated (Unknown ideal weight.).  Liver Function Tests: Recent Labs  Lab 07/11/19 1758  AST 143*  ALT 69*  ALKPHOS 106  BILITOT 0.1*  PROT 4.5*  ALBUMIN 1.4*   No results for input(s): LIPASE, AMYLASE in the last 168 hours. No results for input(s): AMMONIA in the last 168 hours. Coagulation Profile: Recent Labs  Lab 07/11/19 1758  INR 2.8*   Cardiac Enzymes: No results for input(s): CKTOTAL, CKMB, CKMBINDEX, TROPONINI in the last 168 hours. BNP (last 3 results) No results for input(s): PROBNP in the last 8760 hours. HbA1C: No results for input(s): HGBA1C in the last 72 hours. CBG: Recent Labs  Lab 07/05/19 0405 07/05/19 0730 07/05/19 1115  GLUCAP 121* 109* 101*   Lipid Profile: No results for input(s): CHOL, HDL, LDLCALC, TRIG, CHOLHDL, LDLDIRECT in the last 72 hours. Thyroid Function Tests: No results for input(s): TSH, T4TOTAL, FREET4, T3FREE, THYROIDAB in the last 72 hours. Anemia Panel: No results for input(s): VITAMINB12, FOLATE, FERRITIN, TIBC, IRON, RETICCTPCT in the last 72 hours. Urine analysis:    Component Value Date/Time   COLORURINE AMBER (A) 07/11/2019 2058   APPEARANCEUR CLOUDY (A) 07/11/2019 2058   LABSPEC 1.021 07/11/2019 2058   PHURINE 5.0 07/11/2019 2058   GLUCOSEU NEGATIVE 07/11/2019 2058   HGBUR MODERATE (A) 07/11/2019 2058   BILIRUBINUR NEGATIVE 07/11/2019 2058   KETONESUR NEGATIVE  07/11/2019 2058   PROTEINUR NEGATIVE 07/11/2019 2058   NITRITE NEGATIVE 07/11/2019 2058   LEUKOCYTESUR MODERATE (A) 07/11/2019 2058   Sepsis Labs: @LABRCNTIP (procalcitonin:4,lacticidven:4) ) Recent Results (from the past 240 hour(s))  Body fluid culture     Status: None   Collection Time: 07/03/19  3:44 PM   Specimen: Lung, Right; Pleural Fluid  Result Value Ref Range Status   Specimen Description   Final    PLEURAL  RIGHT LUNG Performed at [facility], [place] [address], [place], [place] [address]    Special Requests   Final    NONE Performed at [facility], [place] [address], [place], [place] [address]    Gram Stain   Final    RARE WBC PRESENT, PREDOMINANTLY PMN NO ORGANISMS SEEN    Culture   Final    NO GROWTH 3 DAYS Performed at [facility] [address] [place], [place] [address]    Report Status 07/07/2019 FINAL  Final     Radiological Exams on Admission: Dg Chest Port 1 View  Result Date: 07/11/2019 CLINICAL DATA:  Fever EXAM: PORTABLE CHEST 1 VIEW COMPARISON:  07/03/2019 FINDINGS: Right-sided chest port remains in place. Cardiac silhouette is largely obscured. Masslike thickening of the lower right paratracheal stripe, similar to finding on recent CT. Large bilateral pleural effusions, left greater than right, which appear increased in size compared to prior study. Bibasilar opacities favor atelectasis. No pneumothorax. IMPRESSION: 1. Large bilateral pleural effusions, left greater than right, increased in size compared to prior. 2. Masslike thickening of the right paratracheal stripe is similar in appearance to recent CT. 3. Bibasilar opacities favor atelectasis. Electronically Signed   By: Davina Poke M.D.   On: 07/11/2019 18:21   Dg Abd Portable 1 View  Result Date: 07/11/2019 CLINICAL DATA:  Vomiting, weakness EXAM: PORTABLE ABDOMEN - 1 VIEW COMPARISON:  06/24/2019 FINDINGS: Examination is somewhat limited secondary  to poor penetration from patient body habitus. Nonspecific bowel gas pattern without evidence of obstruction. No gross free intraperitoneal air on this portable supine film. Osseous structures appear intact. IMPRESSION: Slightly limited exam.  Bowel gas pattern appears  nonspecific. Electronically Signed   By: Davina Poke M.D.   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  Admit the patient.  Avoid nephrotoxic drugs.  Aggressively hydrate patient.  Monitor closely.  #2 dehydration: Secondary to poor oral intake.  Increase oral intake  #3 UTI: Initiate IV Rocephin.  Urine culture and blood cultures obtained.  Continue to monitor  #4 symptomatic anemia: Patient's hemoglobin 7.1.  We will get stool guaiac.  We will also transfuse 2 units of packed red blood cells.  Her previous hemoglobin is 7.7 from last week.  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  #5 metastatic breast cancer: Monitor closely.  Follow with [facility]  #6 hypertension: Continue medications  #7 severe protein calorie malnutrition: Increase protein intake   DVT prophylaxis: Xarelto Code Status: Full code Family Communication: Husband over the phone Disposition Plan: Home Consults called: None Admission status: Inpatient  Severity of Illness: The appropriate patient status for this patient is INPATIENT. Inpatient status is judged to be reasonable and necessary in order to provide the required intensity of service to ensure the patient's safety. The patient's  presenting symptoms, physical exam findings, and initial radiographic and laboratory data in the context of their chronic comorbidities is felt to place them at high risk for further clinical deterioration. Furthermore, it is not anticipated that the patient will be medically stable for discharge from the hospital within 2 midnights of admission. The following factors support the patient status of inpatient.   " The patient's presenting symptoms include weakness. " The worrisome physical exam findings include generalized swelling and pain. " The initial radiographic and laboratory data are worrisome because of hemoglobin 7.1. " The chronic co-morbidities include metastatic breast cancer.   * I certify that at the point of admission it is my clinical judgment that the patient will require inpatient hospital care spanning beyond 2 midnights from the point of admission due to high intensity of service, high risk for further deterioration and high frequency of surveillance required.Barbette Merino MD [facility] Pager [phone]  If 7PM-7AM, please contact night-coverage [email or web address] Password [email or web address]  07/11/2019, 11:21 PM

## 2019-07-11 NOTE — ED Notes (Signed)
Jeneba on 6E unavailable to take report at this time, will try again.

## 2019-07-11 NOTE — ED Provider Notes (Signed)
[facility] DEPT Provider Note   CSN: [id number] Arrival date & time: 07/11/19  1709     History   Chief Complaint Chief Complaint  Patient presents with  . Weakness    HPI JLYNN LY is a 53 y.o. female hx of sacral decub ulcer, HTN, metastatic breast cancer here with decreased PO intake. Patient has poor appetite for the last two weeks. Patient has visiting nurse that came 2 days ago and her vitals were normal. Today, she appears weaker and she refused to eat anything. Denies any vomiting. Denies any fevers at home. Patient was noted to be hypotensive and tachycardic per EMS.      The history is provided by the patient and the spouse.  Level V caveat- condition of patient   Past Medical History:  Diagnosis Date  . Hypertension   . met rt breast ca dx'd 03/2018    Patient Active Problem List   Diagnosis Date Noted  . Pleural effusion 07/03/2019  . Severe protein-calorie malnutrition ([place]) 07/03/2019  . SIRS (systemic inflammatory response syndrome) ([place]) 06/29/2019  . 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 PE (pulmonary thromboembolism) ([place]) 06/21/2019  . Sacral decubitus ulcer, stage II ([place]) 06/21/2019  . Hypertension   . 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 Acute metabolic encephalopathy   . Hyponatremia   . AKI (acute kidney injury) ([place])   . Sepsis ([place])   . Normocytic anemia     Past Surgical History:  Procedure Laterality Date  . BREAST SURGERY       OB History   No obstetric history on file.      Home Medications    Prior to Admission medications   Medication Sig Start Date End Date Taking? Authorizing Provider  acetaminophen (TYLENOL) 500 MG tablet Take 1,000 mg by mouth every 6 (six) hours as needed for mild pain.   Yes [provider]  amLODipine (NORVASC) 5 MG tablet Take 1 tablet (5 mg total) by mouth daily. 07/05/19 08/04/19 Yes Amin, Ankit Chirag, MD  capecitabine  (XELODA) 500 MG tablet Take 500 mg by mouth. 05/29/19   [provider]  dexamethasone (DECADRON) 2 MG tablet Take 0.5-2 mg by mouth.     [provider]  famotidine (PEPCID) 20 MG tablet Take 20 mg by mouth daily.    [provider]  furosemide (LASIX) 40 MG tablet Take 1 tablet (40 mg total) by mouth daily. 07/05/19 09/03/19  Amin, Jeanella Flattery, MD  gabapentin (NEURONTIN) 100 MG capsule Take 100 mg by mouth 3 (three) times daily.    [provider]  hydrochlorothiazide (MICROZIDE) 12.5 MG capsule Take 12.5 mg by mouth daily.    [provider]  hyoscyamine (LEVSIN) 0.125 MG tablet Take 0.125-0.25 mg by mouth every 4 (four) hours as needed for cramping.    [provider]  losartan (COZAAR) 100 MG tablet Take 100 mg by mouth daily.    [provider]  metoprolol tartrate (LOPRESSOR) 50 MG tablet Take 1 tablet (50 mg total) by mouth 2 (two) times daily. 07/05/19 09/03/19  Amin, Jeanella Flattery, MD  mirtazapine (REMERON) 7.5 MG tablet Take 7.5 mg by mouth at bedtime.    [provider]  Multiple Vitamin (MULTIVITAMIN WITH MINERALS) TABS tablet Take 1 tablet by mouth daily. 07/05/19   Amin, Jeanella Flattery, MD  ondansetron (ZOFRAN) 4 MG tablet Take 4 mg by mouth every 6 (six) hours as needed for nausea or vomiting.  [provider]  oxyCODONE (OXYCONTIN) 10 mg 12 hr tablet Take 10 mg by mouth 2 (two) times daily.    [provider]  pravastatin (PRAVACHOL) 40 MG tablet Take 40 mg by mouth daily.    [provider]  QUEtiapine (SEROQUEL) 25 MG tablet Take 1 tablet (25 mg total) by mouth daily. 07/05/19 09/03/19  Amin, Jeanella Flattery, MD  rivaroxaban (XARELTO) 20 MG TABS tablet Take 20 mg by mouth daily.    [provider]  tiZANidine (ZANAFLEX) 4 MG tablet Take 4 mg by mouth 3 (three) times daily.    [provider]    Family History Family History  Problem Relation Age of Onset  .  Huntington's disease Mother   . Huntington's disease Sister     Social History Social History   Tobacco Use  . Smoking status: Never Smoker  . Smokeless tobacco: Never Used  Substance Use Topics  . Alcohol use: Not Currently  . Drug use: Never     Allergies   Patient has no known allergies.   Review of Systems Review of Systems  Neurological: Positive for weakness.  All other systems reviewed and are negative.    Physical Exam Updated Vital Signs BP 101/61 (BP Location: Right Arm)   Pulse (!) 114   Temp 98.8 F (37.1 C) (Oral)   Resp (!) 25   SpO2 100%   Physical Exam Vitals signs and nursing note reviewed.  Constitutional:      Comments: Dehydrated, chronically ill   HENT:     Head: Normocephalic.     Nose: Nose normal.     Mouth/Throat:     Mouth: Mucous membranes are dry.  Eyes:     Extraocular Movements: Extraocular movements intact.     Pupils: Pupils are equal, round, and reactive to light.  Neck:     Musculoskeletal: Normal range of motion.  Cardiovascular:     Heart sounds: Normal heart sounds.     Comments: Tachycardic  Pulmonary:     Comments: Diminished bilateral bases  Abdominal:     Palpations: Abdomen is soft.  Musculoskeletal: Normal range of motion.  Skin:    General: Skin is warm.     Capillary Refill: Capillary refill takes less than 2 seconds.  Neurological:     General: No focal deficit present.     Comments: Altered, confused. Moving all extremities   Psychiatric:     Comments: Unable       ED Treatments / Results  Labs (all labs ordered are listed, but only abnormal results are displayed) Labs Reviewed  COMPREHENSIVE METABOLIC PANEL - Abnormal; Notable for the following components:      Result Value   BUN 51 (*)    Creatinine, Ser 1.75 (*)    Calcium 8.0 (*)    Total Protein 4.5 (*)    Albumin 1.4 (*)    AST 143 (*)    ALT 69 (*)    Total Bilirubin 0.1 (*)    GFR calc non Af Amer 33 (*)    GFR calc Af Amer 38  (*)    All other components within normal limits  CBC WITH DIFFERENTIAL/PLATELET - Abnormal; Notable for the following components:   RBC 2.64 (*)    Hemoglobin 7.1 (*)    HCT 23.0 (*)    RDW 21.0 (*)    Platelets 139 (*)    nRBC 0.5 (*)    Lymphs Abs 0.3 (*)    All other  components within normal limits  APTT - Abnormal; Notable for the following components:   aPTT 43 (*)    All other components within normal limits  PROTIME-INR - Abnormal; Notable for the following components:   Prothrombin Time 29.0 (*)    INR 2.8 (*)    All other components within normal limits  I-STAT BETA HCG BLOOD, ED ([facility], [facility], [facility] ONLY) - Abnormal; Notable for the following components:   I-stat hCG, quantitative 13.9 (*)    All other components within normal limits  CULTURE, BLOOD (ROUTINE X 2)  CULTURE, BLOOD (ROUTINE X 2)  URINE CULTURE  SARS CORONAVIRUS 2 (TAT 6-24 HRS)  LACTIC ACID, PLASMA  LACTIC ACID, PLASMA  URINALYSIS, ROUTINE W REFLEX MICROSCOPIC  PREGNANCY, URINE    EKG EKG Interpretation  Date/Time:  Thursday July 11 2019 18:27:17 EDT Ventricular Rate:  115 PR Interval:    QRS Duration: 96 QT Interval:  312 QTC Calculation: 432 R Axis:   86 Text Interpretation:  Sinus tachycardia Low voltage, precordial leads Repol abnrm suggests ischemia, anterolateral ST elevation, consider inferior injury No significant change since last tracing Confirmed by Wandra Arthurs ([id number]) on 07/11/2019 6:35:35 PM   Radiology Dg Chest Port 1 View  Result Date: 07/11/2019 CLINICAL DATA:  Fever EXAM: PORTABLE CHEST 1 VIEW COMPARISON:  07/03/2019 FINDINGS: Right-sided chest port remains in place. Cardiac silhouette is largely obscured. Masslike thickening of the lower right paratracheal stripe, similar to finding on recent CT. Large bilateral pleural effusions, left greater than right, which appear increased in size compared to prior study. Bibasilar opacities favor atelectasis. No pneumothorax. IMPRESSION: 1.  Large bilateral pleural effusions, left greater than right, increased in size compared to prior. 2. Masslike thickening of the right paratracheal stripe is similar in appearance to recent CT. 3. Bibasilar opacities favor atelectasis. Electronically Signed   By: Davina Poke M.D.   On: 07/11/2019 18:21   Dg Abd Portable 1 View  Result Date: 07/11/2019 CLINICAL DATA:  Vomiting, weakness EXAM: PORTABLE ABDOMEN - 1 VIEW COMPARISON:  06/24/2019 FINDINGS: Examination is somewhat limited secondary to poor penetration from patient body habitus. Nonspecific bowel gas pattern without evidence of obstruction. No gross free intraperitoneal air on this portable supine film. Osseous structures appear intact. IMPRESSION: Slightly limited exam.  Bowel gas pattern appears nonspecific. Electronically Signed   By: Davina Poke M.D.   On: 07/11/2019 18:22    Procedures Procedures (including critical care time)  Medications Ordered in ED Medications  vancomycin (VANCOCIN) 1,250 mg in sodium chloride 0.9 % 250 mL IVPB (1,250 mg Intravenous New Bag/Given 07/11/19 1942)  ceFEPIme (MAXIPIME) 2 g in sodium chloride 0.9 % 100 mL IVPB (0 g Intravenous Stopped 07/11/19 1840)  metroNIDAZOLE (FLAGYL) IVPB 500 mg (0 mg Intravenous Stopped 07/11/19 1858)  sodium chloride 0.9 % bolus 1,000 mL (1,000 mLs Intravenous New Bag/Given 07/11/19 1758)    And  sodium chloride 0.9 % bolus 1,000 mL (1,000 mLs Intravenous New Bag/Given 07/11/19 1758)     Initial Impression / Assessment and Plan / ED Course  I have reviewed the triage vital signs and the nursing notes.  Pertinent labs & imaging results that were available during my care of the patient were reviewed by me and considered in my medical decision making (see chart for details).       TAMIKI KUBA is a 53 y.o. female here with AMS, hypotensive, tachycardic. Recent admission for sepsis but no cause was found and was thought to be dehydrated.  Will activate code  sepsis and will get labs, lactate, cultures, CXR, COVID.   7:55 PM Labs showed AKI with Cr 1.7. WBC and lactate normal. CXR showed bilateral pleural effusions. BP improved with IVF. I think her hypotension is likely from dehydration rather than sepsis. Will admit for AKI, hypotension.   Final Clinical Impressions(s) / ED Diagnoses   Final diagnoses:  None    ED Discharge Orders    None       Drenda Freeze, MD 07/11/19 [place]

## 2019-07-11 NOTE — Progress Notes (Signed)
A consult was received from an ED physician for vancomycin & cefepime per pharmacy dosing.  The patient's profile has been reviewed for ht/wt/allergies/indication/available labs.   A one time order has been placed for vancomycin 1250 mg and cefepime 2 gm and Flagyl 500 mg IV x 1 (per MD).  Use Weight of 63.5 from 06/20/2019   Further antibiotics/pharmacy consults should be ordered by admitting physician if indicated.                       Thank you,  Eudelia Bunch, Pharm.D [phone] 07/11/2019 5:35 PM

## 2019-07-11 NOTE — ED Notes (Addendum)
ED TO INPATIENT HANDOFF REPORT  ED Nurse Name and Phone #:  Leafy Kindle [phone]  S Name/Age/Gender Kaylee Randolph 53 y.o. female Room/Bed: WA03/WA03  Code Status   Code Status: Prior  Home/SNF/Other Nursing Home Patient oriented to: self Is this baseline? Yes   Triage Complete: Triage complete  Chief Complaint sepsis  Triage Note EMS reports from home, increasing weakness this afternoon with emesis. Husband reports Pt not eating or drinking well. Breast cancer Pt, last chemo treat 2 weeks ago.  BP 91/56 after bolus of 700ns HR 115 RR 20 Sp02 92 RA Temp 97.9  20ga LAC  61m Zofran 7030mNS enroute    Allergies No Known Allergies  Level of Care/Admitting Diagnosis ED Disposition    ED Disposition Condition Comment   Ad[place]  [id number]  Level of Care: Med-Surg [16]  Covid Evaluation: Asymptomatic Screening Protocol (No Symptoms)  Diagnosis: ARF (acute renal failure) ([place][[id number]]Admitting Physician: GAElwyn [id number]]  Attending Physician: GAElwyn [id number]]  Estimated length of stay: past midnight tomorrow  Certification:: I certify this patient will need inpatient services for at least 2 midnights  PT Class (Do Not Modify): Inpatient [101]  PT Acc Code (Do Not Modify): Private [1]       B Medical/Surgery History Past Medical History:  Diagnosis Date  . Hypertension   . met rt breast ca dx'd 03/2018   Past Surgical History:  Procedure Laterality Date  . BREAST SURGERY       A IV Location/Drains/Wounds Patient Lines/Drains/Airways Status   Active Line/Drains/Airways    Name:   Placement date:   Placement time:   Site:   Days:   Implanted Port 06/20/19 Chest   06/20/19    2023    Chest   21   Peripheral IV 07/11/19 Anterior;Left;Proximal Forearm   07/11/19    1722    Forearm   less than 1   Urethral Catheter JeLeafy Kindletraight-tip;Non-latex 14 Fr.   07/11/19    2039     Straight-tip;Non-latex   less than 1   Pressure Injury 06/20/19 Coccyx Mid Stage II -  Partial thickness loss of dermis presenting as a shallow open ulcer with a red, pink wound bed without slough.   06/20/19    2102     21          Intake/Output Last 24 hours  Intake/Output Summary (Last 24 hours) at 07/11/2019 2112 Last data filed at 07/11/2019 1858 Gross per 24 hour  Intake 200 ml  Output -  Net 200 ml    Labs/Imaging Results for orders placed or performed during the hospital encounter of 07/11/19 (from the past 48 hour(s))  Lactic acid, plasma     Status: None   Collection Time: 07/11/19  5:58 PM  Result Value Ref Range   Lactic Acid, Venous 1.7 0.5 - 1.9 mmol/L    Comment: Performed at [facility] [place] [id number] metabolic panel     Status: Abnormal   Collection Time: 07/11/19  5:58 PM  Result Value Ref Range   Sodium 139 135 - 145 mmol/L   Potassium 3.9 3.5 - 5.1 mmol/L   Chloride 106 98 - 111 mmol/L   CO2 23 22 - 32 mmol/L   Glucose, Bld 88 70 - 99 mg/dL   BUN 51 (H) 6 - 20 mg/dL   Creatinine, Ser 1.75 (H) 0.44 -  1.00 mg/dL   Calcium 8.0 (L) 8.9 - 10.3 mg/dL   Total Protein 4.5 (L) 6.5 - 8.1 g/dL   Albumin 1.4 (L) 3.5 - 5.0 g/dL   AST 143 (H) 15 - 41 U/L   ALT 69 (H) 0 - 44 U/L   Alkaline Phosphatase 106 38 - 126 U/L   Total Bilirubin 0.1 (L) 0.3 - 1.2 mg/dL   GFR calc non Af Amer 33 (L) >60 mL/min   GFR calc Af Amer 38 (L) >60 mL/min   Anion gap 10 5 - 15    Comment: Performed at [facility], [place] [address], [place], [place] [address]  CBC WITH DIFFERENTIAL     Status: Abnormal   Collection Time: 07/11/19  5:58 PM  Result Value Ref Range   WBC 7.9 4.0 - 10.5 K/uL    Comment: WHITE COUNT CONFIRMED ON SMEAR   RBC 2.64 (L) 3.87 - 5.11 MIL/uL   Hemoglobin 7.1 (L) 12.0 - 15.0 g/dL   HCT 23.0 (L) 36.0 - 46.0 %   MCV 87.1 80.0 - 100.0 fL   MCH 26.9 26.0 - 34.0 pg   MCHC 30.9 30.0  - 36.0 g/dL   RDW 21.0 (H) 11.5 - 15.5 %   Platelets 139 (L) 150 - 400 K/uL   nRBC 0.5 (H) 0.0 - 0.2 %   Neutrophils Relative % 92 %   Neutro Abs 7.3 1.7 - 7.7 K/uL   Lymphocytes Relative 3 %   Lymphs Abs 0.3 (L) 0.7 - 4.0 K/uL   Monocytes Relative 4 %   Monocytes Absolute 0.3 0.1 - 1.0 K/uL   Eosinophils Relative 0 %   Eosinophils Absolute 0.0 0.0 - 0.5 K/uL   Basophils Relative 0 %   Basophils Absolute 0.0 0.0 - 0.1 K/uL   Immature Granulocytes 1 %   Abs Immature Granulocytes 0.06 0.00 - 0.07 K/uL    Comment: Performed at [facility], [place] [address] [place], [place] [address]  APTT     Status: Abnormal   Collection Time: 07/11/19  5:58 PM  Result Value Ref Range   aPTT 43 (H) 24 - 36 seconds    Comment:        IF BASELINE aPTT IS ELEVATED, SUGGEST PATIENT RISK ASSESSMENT BE USED TO DETERMINE APPROPRIATE ANTICOAGULANT THERAPY. Performed at [facility], [place] [address], [place], [place] [address]   Protime-INR     Status: Abnormal   Collection Time: 07/11/19  5:58 PM  Result Value Ref Range   Prothrombin Time 29.0 (H) 11.4 - 15.2 seconds   INR 2.8 (H) 0.8 - 1.2    Comment: (NOTE) INR goal varies based on device and disease states. Performed at [facility], [place] [address], [place], [place] [address]   I-Stat beta hCG blood, ED     Status: Abnormal   Collection Time: 07/11/19  6:04 PM  Result Value Ref Range   I-stat hCG, quantitative 13.9 (H) <5 mIU/mL   Comment 3            Comment:   GEST. AGE      CONC.  (mIU/mL)   <=1 WEEK        5 - 50     2 WEEKS       50 - 500     3 WEEKS       100 - 10,000     4 WEEKS     1,000 - 30,000  FEMALE AND NON-PREGNANT FEMALE:     LESS THAN 5 mIU/mL    Dg Chest Port 1 View  Result Date: 07/11/2019 CLINICAL DATA:  Fever EXAM: PORTABLE CHEST 1 VIEW COMPARISON:  07/03/2019 FINDINGS: Right-sided chest port remains in place. Cardiac silhouette is largely obscured.  Masslike thickening of the lower right paratracheal stripe, similar to finding on recent CT. Large bilateral pleural effusions, left greater than right, which appear increased in size compared to prior study. Bibasilar opacities favor atelectasis. No pneumothorax. IMPRESSION: 1. Large bilateral pleural effusions, left greater than right, increased in size compared to prior. 2. Masslike thickening of the right paratracheal stripe is similar in appearance to recent CT. 3. Bibasilar opacities favor atelectasis. Electronically Signed   By: Davina Poke M.D.   On: 07/11/2019 18:21   Dg Abd Portable 1 View  Result Date: 07/11/2019 CLINICAL DATA:  Vomiting, weakness EXAM: PORTABLE ABDOMEN - 1 VIEW COMPARISON:  06/24/2019 FINDINGS: Examination is somewhat limited secondary to poor penetration from patient body habitus. Nonspecific bowel gas pattern without evidence of obstruction. No gross free intraperitoneal air on this portable supine film. Osseous structures appear intact. IMPRESSION: Slightly limited exam.  Bowel gas pattern appears nonspecific. Electronically Signed   By: Davina Poke M.D.   On: 07/11/2019 18:22    Pending Labs Unresulted Labs (From admission, onward)    Start     Ordered   07/11/19 1818  Pregnancy, urine  ONCE - STAT,   STAT     07/11/19 1817   07/11/19 1732  SARS CORONAVIRUS 2 (TAT 6-24 HRS) Nasopharyngeal Nasopharyngeal Swab  (Symptomatic/High Risk of Exposure/Tier 1 Patients Labs with Precautions)  Once,   STAT    Question Answer Comment  Is this test for diagnosis or screening Diagnosis of ill patient   Symptomatic for COVID-19 as defined by CDC No   Hospitalized for COVID-19 No   Admitted to ICU for COVID-19 No   Previously tested for COVID-19 Yes   Resident in a congregate (group) care setting No   Employed in healthcare setting No   Pregnant No      07/11/19 1731   07/11/19 1731  Lactic acid, plasma  Now then every 2 hours,   STAT     07/11/19 1731   07/11/19  1731  Blood Culture (routine x 2)  BLOOD CULTURE X 2,   STAT     07/11/19 1731   07/11/19 1731  Urinalysis, Routine w reflex microscopic  ONCE - STAT,   STAT     07/11/19 1731   07/11/19 1731  Urine culture  ONCE - STAT,   STAT     07/11/19 1731          Vitals/Pain Today's Vitals   07/11/19 2041 07/11/19 2042 07/11/19 2059 07/11/19 2100  BP:  107/68 107/68 103/62  Pulse: (!) 119 (!) 119 (!) 120   Resp: (!) 30 (!) 28 (!) 24   Temp:      TempSrc:      SpO2: 94% 94% 94%   PainSc:        Isolation Precautions No active isolations  Medications Medications  vancomycin (VANCOCIN) 1,250 mg in sodium chloride 0.9 % 250 mL IVPB (1,250 mg Intravenous New Bag/Given 07/11/19 1942)  ceFEPIme (MAXIPIME) 2 g in sodium chloride 0.9 % 100 mL IVPB (0 g Intravenous Stopped 07/11/19 1840)  metroNIDAZOLE (FLAGYL) IVPB 500 mg (0 mg Intravenous Stopped 07/11/19 1858)  sodium chloride 0.9 % bolus 1,000 mL (1,000 mLs Intravenous New  Bag/Given 07/11/19 1758)    And  sodium chloride 0.9 % bolus 1,000 mL (1,000 mLs Intravenous New Bag/Given 07/11/19 1758)    Mobility walks with device Low fall risk     R Recommendations: See Admitting Provider Note  Report given to:  Belma 4E #[id number]

## 2019-07-11 NOTE — ED Triage Notes (Signed)
EMS reports from home, increasing weakness this afternoon with emesis. Husband reports Pt not eating or drinking well. Breast cancer Pt, last chemo treat 2 weeks ago.  BP 91/56 after bolus of 700ns HR 115 RR 20 Sp02 92 RA Temp 97.9  20ga LAC  4mg  Zofran 713ml NS enroute

## 2019-07-12 ENCOUNTER — Inpatient Hospital Stay (HOSPITAL_COMMUNITY): Payer: BC Managed Care – PPO

## 2019-07-12 DIAGNOSIS — C7970 Secondary malignant neoplasm of unspecified adrenal gland: Secondary | ICD-10-CM

## 2019-07-12 DIAGNOSIS — C50919 Malignant neoplasm of unspecified site of unspecified female breast: Secondary | ICD-10-CM

## 2019-07-12 DIAGNOSIS — R627 Adult failure to thrive: Secondary | ICD-10-CM

## 2019-07-12 LAB — CBC
HCT: 22.7 % — ABNORMAL LOW (ref 36.0–46.0)
HCT: 27.6 % — ABNORMAL LOW (ref 36.0–46.0)
Hemoglobin: 7.1 g/dL — ABNORMAL LOW (ref 12.0–15.0)
Hemoglobin: 8.8 g/dL — ABNORMAL LOW (ref 12.0–15.0)
MCH: 27.3 pg (ref 26.0–34.0)
MCH: 27.4 pg (ref 26.0–34.0)
MCHC: 31.3 g/dL (ref 30.0–36.0)
MCHC: 31.9 g/dL (ref 30.0–36.0)
MCV: 87.3 fL (ref 80.0–100.0)
MCV: 86 fL (ref 80.0–100.0)
Platelets: 124 10*3/uL — ABNORMAL LOW (ref 150–400)
Platelets: 109 10*3/uL — ABNORMAL LOW (ref 150–400)
RBC: 2.6 MIL/uL — ABNORMAL LOW (ref 3.87–5.11)
RBC: 3.21 MIL/uL — ABNORMAL LOW (ref 3.87–5.11)
RDW: 20.9 % — ABNORMAL HIGH (ref 11.5–15.5)
RDW: 18.3 % — ABNORMAL HIGH (ref 11.5–15.5)
WBC: 8.6 10*3/uL (ref 4.0–10.5)
WBC: 8.1 10*3/uL (ref 4.0–10.5)
nRBC: 0.4 % — ABNORMAL HIGH (ref 0.0–0.2)
nRBC: 0.2 % (ref 0.0–0.2)

## 2019-07-12 LAB — COMPREHENSIVE METABOLIC PANEL
ALT: 68 U/L — ABNORMAL HIGH (ref 0–44)
ALT: 63 U/L — ABNORMAL HIGH (ref 0–44)
AST: 115 U/L — ABNORMAL HIGH (ref 15–41)
AST: 81 U/L — ABNORMAL HIGH (ref 15–41)
Albumin: 1.5 g/dL — ABNORMAL LOW (ref 3.5–5.0)
Albumin: 1.7 g/dL — ABNORMAL LOW (ref 3.5–5.0)
Alkaline Phosphatase: 109 U/L (ref 38–126)
Alkaline Phosphatase: 110 U/L (ref 38–126)
Anion gap: 10 (ref 5–15)
Anion gap: 12 (ref 5–15)
BUN: 52 mg/dL — ABNORMAL HIGH (ref 6–20)
BUN: 55 mg/dL — ABNORMAL HIGH (ref 6–20)
CO2: 22 mmol/L (ref 22–32)
CO2: 22 mmol/L (ref 22–32)
Calcium: 8.2 mg/dL — ABNORMAL LOW (ref 8.9–10.3)
Calcium: 8 mg/dL — ABNORMAL LOW (ref 8.9–10.3)
Chloride: 106 mmol/L (ref 98–111)
Chloride: 105 mmol/L (ref 98–111)
Creatinine, Ser: 1.71 mg/dL — ABNORMAL HIGH (ref 0.44–1.00)
Creatinine, Ser: 1.58 mg/dL — ABNORMAL HIGH (ref 0.44–1.00)
GFR calc Af Amer: 39 mL/min — ABNORMAL LOW (ref >60)
GFR calc Af Amer: 43 mL/min — ABNORMAL LOW (ref >60)
GFR calc non Af Amer: 34 mL/min — ABNORMAL LOW (ref >60)
GFR calc non Af Amer: 37 mL/min — ABNORMAL LOW (ref >60)
Glucose, Bld: 78 mg/dL (ref 70–99)
Glucose, Bld: 79 mg/dL (ref 70–99)
Potassium: 3.8 mmol/L (ref 3.5–5.1)
Potassium: 3.5 mmol/L (ref 3.5–5.1)
Sodium: 138 mmol/L (ref 135–145)
Sodium: 139 mmol/L (ref 135–145)
Total Bilirubin: 0.7 mg/dL (ref 0.3–1.2)
Total Bilirubin: 0.6 mg/dL (ref 0.3–1.2)
Total Protein: 5.1 g/dL — ABNORMAL LOW (ref 6.5–8.1)
Total Protein: 5.1 g/dL — ABNORMAL LOW (ref 6.5–8.1)

## 2019-07-12 LAB — GLUCOSE, CAPILLARY
Glucose-Capillary: 62 mg/dL — ABNORMAL LOW (ref 70–99)
Glucose-Capillary: 112 mg/dL — ABNORMAL HIGH (ref 70–99)

## 2019-07-12 LAB — PREPARE RBC (CROSSMATCH)

## 2019-07-12 LAB — URINE CULTURE: Culture: >=100000 — AB

## 2019-07-12 LAB — SARS CORONAVIRUS 2 (TAT 6-24 HRS): SARS Coronavirus 2: NEGATIVE

## 2019-07-12 MED ORDER — ONDANSETRON HCL 4 MG/2ML IJ SOLN
4.0000 mg | Freq: Four times a day (QID) | INTRAMUSCULAR | Status: DC | PRN
  Administered 2019-07-12 – 2019-07-13 (×4): 4 mg via INTRAVENOUS
  Filled 2019-07-12 (×5): qty 2

## 2019-07-12 MED ORDER — MORPHINE SULFATE (PF) 2 MG/ML IV SOLN
1.0000 mg | INTRAVENOUS | Status: DC | PRN
  Administered 2019-07-13 (×2): 1 mg via INTRAVENOUS
  Filled 2019-07-12 (×2): qty 1

## 2019-07-12 MED ORDER — ACETAMINOPHEN 650 MG RE SUPP: 650.0000 mg | Freq: Four times a day (QID) | RECTAL | Status: DC | PRN

## 2019-07-12 MED ORDER — SODIUM CHLORIDE 0.9% IV SOLUTION
Freq: Once | INTRAVENOUS | Status: AC
  Administered 2019-07-12: 04:00:00 via INTRAVENOUS

## 2019-07-12 MED ORDER — LOSARTAN POTASSIUM 50 MG PO TABS: 100.0000 mg | ORAL_TABLET | Freq: Every day | ORAL | Status: DC

## 2019-07-12 MED ORDER — AMLODIPINE BESYLATE 5 MG PO TABS: 5.0000 mg | ORAL_TABLET | Freq: Every day | ORAL | Status: DC

## 2019-07-12 MED ORDER — PRAVASTATIN SODIUM 40 MG PO TABS
40.0000 mg | ORAL_TABLET | Freq: Every day | ORAL | Status: DC
  Administered 2019-07-12: 11:00:00 40 mg via ORAL
  Filled 2019-07-12: qty 1

## 2019-07-12 MED ORDER — METOPROLOL TARTRATE 50 MG PO TABS
50.0000 mg | ORAL_TABLET | Freq: Two times a day (BID) | ORAL | Status: DC
  Administered 2019-07-12: 11:00:00 50 mg via ORAL
  Filled 2019-07-12: qty 1

## 2019-07-12 MED ORDER — LORAZEPAM 2 MG/ML PO CONC: 1.0000 mg | ORAL | Status: DC | PRN

## 2019-07-12 MED ORDER — MORPHINE SULFATE (CONCENTRATE) 10 MG/0.5ML PO SOLN
5.0000 mg | ORAL | Status: DC | PRN
  Administered 2019-07-12 (×2): 5 mg via SUBLINGUAL
  Filled 2019-07-12 (×2): qty 0.5

## 2019-07-12 MED ORDER — ENSURE ENLIVE PO LIQD: 237.0000 mL | ORAL | Status: DC

## 2019-07-12 MED ORDER — HYDROCHLOROTHIAZIDE 12.5 MG PO CAPS: 12.5000 mg | ORAL_CAPSULE | Freq: Every day | ORAL | Status: DC

## 2019-07-12 MED ORDER — PANTOPRAZOLE SODIUM 40 MG IV SOLR
40.0000 mg | INTRAVENOUS | Status: DC
  Administered 2019-07-12: 40 mg via INTRAVENOUS
  Filled 2019-07-12: qty 40

## 2019-07-12 MED ORDER — MORPHINE SULFATE (PF) 4 MG/ML IV SOLN
4.0000 mg | INTRAVENOUS | Status: AC | PRN
  Administered 2019-07-12 (×2): 4 mg via INTRAVENOUS
  Filled 2019-07-12 (×2): qty 1

## 2019-07-12 MED ORDER — SENNA 8.6 MG PO TABS: 1.0000 | ORAL_TABLET | Freq: Every evening | ORAL | Status: DC | PRN

## 2019-07-12 MED ORDER — ACETAMINOPHEN 325 MG PO TABS: 650.0000 mg | ORAL_TABLET | Freq: Four times a day (QID) | ORAL | Status: DC | PRN

## 2019-07-12 MED ORDER — ALBUTEROL SULFATE (2.5 MG/3ML) 0.083% IN NEBU: 2.5000 mg | INHALATION_SOLUTION | RESPIRATORY_TRACT | Status: DC | PRN

## 2019-07-12 MED ORDER — PROCHLORPERAZINE MALEATE 10 MG PO TABS: 5.0000 mg | ORAL_TABLET | Freq: Four times a day (QID) | ORAL | Status: DC | PRN

## 2019-07-12 MED ORDER — SODIUM CHLORIDE 0.9 % IV SOLN
1.0000 g | Freq: Every day | INTRAVENOUS | Status: DC
  Administered 2019-07-12: 02:00:00 1 g via INTRAVENOUS
  Filled 2019-07-12: qty 10
  Filled 2019-07-12: qty 1

## 2019-07-12 MED ORDER — MORPHINE SULFATE (CONCENTRATE) 10 MG/0.5ML PO SOLN: 5.0000 mg | ORAL | Status: DC | PRN

## 2019-07-12 MED ORDER — DEXTROSE 50 % IV SOLN
12.5000 g | INTRAVENOUS | Status: AC
  Administered 2019-07-12: 12.5 g via INTRAVENOUS

## 2019-07-12 MED ORDER — ADULT MULTIVITAMIN W/MINERALS CH
1.0000 | ORAL_TABLET | Freq: Every day | ORAL | Status: DC
  Filled 2019-07-12: qty 1

## 2019-07-12 MED ORDER — CHLORHEXIDINE GLUCONATE CLOTH 2 % EX PADS
6.0000 | MEDICATED_PAD | Freq: Every day | CUTANEOUS | Status: DC
  Administered 2019-07-12 – 2019-07-13 (×2): 6 via TOPICAL

## 2019-07-12 MED ORDER — LORAZEPAM 2 MG/ML IJ SOLN: 1.0000 mg | INTRAMUSCULAR | Status: DC | PRN

## 2019-07-12 MED ORDER — DEXTROSE 50 % IV SOLN
INTRAVENOUS | Status: AC
  Filled 2019-07-12: qty 50

## 2019-07-12 MED ORDER — SODIUM CHLORIDE 0.9 % IV SOLN
INTRAVENOUS | Status: DC
  Administered 2019-07-12: 11:00:00 via INTRAVENOUS

## 2019-07-12 MED ORDER — RIVAROXABAN 20 MG PO TABS
20.0000 mg | ORAL_TABLET | Freq: Every day | ORAL | Status: DC
  Administered 2019-07-12: 20 mg via ORAL
  Filled 2019-07-12: qty 1

## 2019-07-12 MED ORDER — ACETAMINOPHEN 500 MG PO TABS: 1000.0000 mg | ORAL_TABLET | Freq: Four times a day (QID) | ORAL | Status: DC | PRN

## 2019-07-12 MED ORDER — QUETIAPINE FUMARATE 25 MG PO TABS
25.0000 mg | ORAL_TABLET | Freq: Every day | ORAL | Status: DC
  Administered 2019-07-12 – 2019-07-13 (×2): 25 mg via ORAL
  Filled 2019-07-12 (×2): qty 1

## 2019-07-12 MED ORDER — PROMETHAZINE HCL 25 MG/ML IJ SOLN
12.5000 mg | Freq: Four times a day (QID) | INTRAMUSCULAR | Status: DC | PRN
  Administered 2019-07-12 (×2): 12.5 mg via INTRAVENOUS
  Filled 2019-07-12 (×3): qty 1

## 2019-07-12 MED ORDER — TIZANIDINE HCL 4 MG PO TABS
4.0000 mg | ORAL_TABLET | Freq: Three times a day (TID) | ORAL | Status: DC
  Administered 2019-07-12: 11:00:00 4 mg via ORAL
  Filled 2019-07-12: qty 1

## 2019-07-12 MED ORDER — GABAPENTIN 100 MG PO CAPS
100.0000 mg | ORAL_CAPSULE | Freq: Every morning | ORAL | Status: DC
  Administered 2019-07-12: 100 mg via ORAL
  Filled 2019-07-12: qty 1

## 2019-07-12 MED ORDER — FUROSEMIDE 40 MG PO TABS: 40.0000 mg | ORAL_TABLET | Freq: Every day | ORAL | Status: DC

## 2019-07-12 MED ORDER — ONDANSETRON HCL 4 MG PO TABS: 4.0000 mg | ORAL_TABLET | Freq: Four times a day (QID) | ORAL | Status: DC | PRN

## 2019-07-12 MED ORDER — MIRTAZAPINE 15 MG PO TABS
7.5000 mg | ORAL_TABLET | Freq: Every day | ORAL | Status: DC
  Administered 2019-07-12: 7.5 mg via ORAL
  Filled 2019-07-12: qty 1

## 2019-07-12 MED ORDER — OXYCODONE HCL 5 MG PO TABS: 5.0000 mg | ORAL_TABLET | ORAL | Status: DC | PRN

## 2019-07-12 MED ORDER — LORAZEPAM 1 MG PO TABS: 1.0000 mg | ORAL_TABLET | ORAL | Status: DC | PRN

## 2019-07-12 MED ORDER — HYOSCYAMINE SULFATE 0.125 MG SL SUBL
0.1250 mg | SUBLINGUAL_TABLET | SUBLINGUAL | Status: DC | PRN
  Filled 2019-07-12: qty 2

## 2019-07-12 MED ORDER — BOOST / RESOURCE BREEZE PO LIQD CUSTOM
1.0000 | Freq: Two times a day (BID) | ORAL | Status: DC
  Administered 2019-07-12: 1 via ORAL

## 2019-07-12 MED ORDER — JUVEN PO PACK
1.0000 | PACK | Freq: Two times a day (BID) | ORAL | Status: DC
  Filled 2019-07-12 (×4): qty 1

## 2019-07-12 NOTE — Progress Notes (Addendum)
Initial Nutrition Assessment  DOCUMENTATION CODES:   Not applicable  INTERVENTION:  - will order Boost Breeze BID, each supplement provides 250 kcal and 9 grams of protein. - will order Ensure Enlive once/day, each supplement provides 350 kcal and 20 grams of protein.  - will order Juven BID, each packet provides 95 calories, 2.5 grams of protein (collagen), and 9.8 grams of carbohydrate (3 grams sugar); also contains 7 grams of L-arginine and L-glutamine, 300 mg vitamin C, 15 mg vitamin E, 1.2 mcg vitamin B-12, 9.5 mg zinc, 200 mg calcium, and 1.5 g  Calcium Beta-hydroxy-Beta-methylbutyrate to support wound healing    NUTRITION DIAGNOSIS:   Inadequate oral intake related to poor appetite, nausea, acute illness as evidenced by per patient/family report.  GOAL:   Patient will meet greater than or equal to 90% of their needs  MONITOR:   PO intake, Supplement acceptance, Labs, Weight trends, Skin  REASON FOR ASSESSMENT:   Consult Assessment of nutrition requirement/status  ASSESSMENT:   53 y.o. female with medical history significant of metastatic breast cancer, HTN, and sacral decubitus ulcer. She presented to the ED due to progressive weakness, poor appetite x1 week, and poor oral intake x2 days. She was found to be hypotensive, tachycardic, and visibly dehydrated. She was admitted for AKI, UTI, and dehydration.  No intakes documented since admission. Patient holding emesis bag at the time of RD visit. Limited information able to be obtained at this time as patient began to cough and dry heave. She has not yet seen a RD at [facility]. She has had a slow decline in her appetite with the past few days being notably decreased. She does not usually have nausea associated with cancer treatment.  Per chart review, current weight is 174 lb but flow sheet documentation indicates deep and very deep pitting edema to all extremities. Weight from 10/1 (140 lb) used to estimate nutrition needs.  Weight at [facility] on 06/07/19 was 155 lb and weight at [facility] on 05/31/19 was 140 lb.   Per notes: - AKI - dehydration 2/2 poor oral intake - symptomatic anemia--plan for 2 units PRBCs; suspected chronic anemia 2/2 cancer - severe malnutrition--RD unable to identify malnutrition based on information currently available   Patient was recently hospitalized and was being followed by this RD. She was noted to have SBO 2/2 peritoneal carcinomatosis/metastatic disease at that time. She was unable to tolerate PO intakes without vomiting during recent hospitalization. RD had talked with MD about initiating TPN via port and MD had talked with patient and her husband about this. As of 10/15, patient and husband wanted to encourage PO intakes and if intakes did not improve over a couple of days, they would re-visit discussion about TPN.     Labs reviewed; CBGs: 62 and 112 mg/dl, BUN: 52 mg/dl, creatinine: 1.71 mg/dl, Ca: 8.2 mg/dl, LFTs elevated, GFR: 39 ml/min. Medications reviewed; daily multivitamin with minerals, 40 mg IV protonix/day. IVF; NS @ 50 ml/hr.     NUTRITION - FOCUSED PHYSICAL EXAM:  unable to complete at this time.   Diet Order:   Diet Order            Diet Heart Room service appropriate? Yes; Fluid consistency: Thin  Diet effective now              EDUCATION NEEDS:   Not appropriate for education at this time  Skin:  Skin Assessment: Skin Integrity Issues: Skin Integrity Issues:: Stage II Stage II: coccyx  Last BM:  10/21  Height:   Ht Readings from Last 1 Encounters:  07/12/19 5\' 4"  (1.626 m)    Weight:   Wt Readings from Last 1 Encounters:  07/12/19 79 kg    Ideal Body Weight:  54.5 kg  BMI:  Body mass index is 29.88 kg/m.  Estimated Nutritional Needs:   Kcal:  2100-2300 kcal  Protein:  105-115 grams  Fluid:  >/= 2.3 L/day      Jarome Matin, MS, RD, LDN, [facility] Inpatient Clinical Dietitian Pager # [phone] After hours/weekend pager #  [phone]

## 2019-07-12 NOTE — Plan of Care (Signed)
  Problem: Education: Goal: Knowledge of General Education information will improve Description: Including pain rating scale, medication(s)/side effects and non-pharmacologic comfort measures Outcome: Progressing   Problem: Clinical Measurements: Goal: Ability to maintain clinical measurements within normal limits will improve Outcome: Progressing Goal: Will remain free from infection Outcome: Progressing Goal: Diagnostic test results will improve Outcome: Progressing Goal: Respiratory complications will improve Outcome: Progressing Goal: Cardiovascular complication will be avoided Outcome: Not Progressing   Problem: Activity: Goal: Risk for activity intolerance will decrease Outcome: Not Progressing   Problem: Nutrition: Goal: Adequate nutrition will be maintained Outcome: Not Progressing   Problem: Coping: Goal: Level of anxiety will decrease Outcome: Progressing   Problem: Pain Managment: Goal: General experience of comfort will improve Outcome: Progressing   Problem: Safety: Goal: Ability to remain free from injury will improve Outcome: Progressing   Problem: Skin Integrity: Goal: Risk for impaired skin integrity will decrease Outcome: Progressing

## 2019-07-12 NOTE — Consult Note (Signed)
[place] Nurse wound consult note Reason for Consult:Stage 2 pressure injury to sacrum with surrounding moisture associated skin damage to buttocks and bilateral gluteal folds.  Wound type pressure and moisture Pressure Injury POA: Yes Measurement: sacrum 2 cm x 2 cm x 0.2 cm with denuded skin to each buttocks measuring 1 cm x 1 cm x o.1 cm  Wound [id number] and moist Drainage (amount, consistency, odor) scant weeping Periwound:MASD from incontinence. External urinary manager in place.  Dressing procedure/placement/frequency: Cleanse sacral wound with NS and pat dry.  Apply small piece alginate to wound bed.  Cover with silicone foam. Change every three days and PRN soilage.  Will not follow at this time.  Please re-consult if needed.  Domenic Moras MSN, RN, FNP-BC CWON Wound, Ostomy, Continence Nurse Pager [phone]

## 2019-07-12 NOTE — Progress Notes (Signed)
Hypoglycemic Event  CBG: 62  Treatment: D50 25 mL (12.5 gm)  Symptoms: Nausea/vomiting  Follow-up CBG: [id number] CBG Result:102  Possible Reasons for Event: Inadequate meal intake  Comments/MD notified: standing orders initiated     Merick Kelleher

## 2019-07-12 NOTE — TOC Progression Note (Signed)
Transition of Care [facility]) - Progression Note    Patient Details  Name: Kaylee Randolph MRN: [id number] Date of Birth: 01-24-1966  Transition of Care [facility]) CM/SW Contact  Purcell Mouton, RN Phone Number: 07/12/2019, 3:34 PM  Clinical Narrative:    Spoke with pt's husband related to discharge plans Home with Hospice vs Residential Hospice. Mr. Gruwell selected [place] and for the pt to come home with Hospice. Referral was called to [facility].         Expected Discharge Plan and Services                                                 Social Determinants of Health (SDOH) Interventions    Readmission Risk Interventions Readmission Risk Prevention Plan 06/24/2019  Transportation Screening Complete  PCP or Specialist Appt within 3-5 Days Not Complete  Not Complete comments Not ready for dc  HRI or [place] Complete  Social Work Consult for [place] Planning/Counseling Not Complete  SW consult not completed comments NA  Palliative Care Screening Not Applicable  Medication Review Press photographer) Complete  Some recent data might be hidden

## 2019-07-12 NOTE — Progress Notes (Signed)
   07/12/19 2135  Vitals  Temp 99 F (37.2 C)  Temp Source Oral  BP (!) 84/48  MAP (mmHg) (!) 58  BP Location Left Arm  BP Method Automatic  Patient Position (if appropriate) Lying  Pulse Rate (!) 115  Resp (!) 24  Oxygen Therapy  SpO2 92 %  O2 Device Room Air  MEWS Score  MEWS RR 1  MEWS Pulse 2  MEWS Systolic 1  MEWS LOC 0  MEWS Temp 0  MEWS Score 4  MEWS Score Color Red  MEWS Assessment  Is this an acute change? No  MEWS guidelines implemented *See [place]  Provider Notification  Provider Name/Title J.Kim  Date Provider Notified 07/12/19  Time Provider Notified 2155  Notification Type Page  Notification Reason Other (Comment)   MD made aware

## 2019-07-12 NOTE — Progress Notes (Signed)
PROGRESS NOTE   Kaylee Randolph  VFI:[id number]    DOB: 10-04-1965    DOA: 07/11/2019  PCP: Maury Dus, MD   I have briefly reviewed patients previous medical records in [facility].  Chief Complaint  Patient presents with  . 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 10/22 due to progressive generalized weakness, poor appetite, generalized body swelling including abdominal distention, dyspnea.  Home health RN noted her dyspnea and advised her to come to ED.  Critically ill.  Evaluated in detail at bedside along with patient's RN this morning.  Subsequently discussed in detail with Dr. Sonny Dandy with Oncology who subsequently met and discussed with patient/spouse and transitioned to comfort care/hospice.  CSW met with spouse who opted for home hospice, likely discharge 10/24.   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 Metastatic breast cancer: Dr. Sonny Dandy, Oncology evaluated patient, met with patient and spouse and recommended transitioning to hospice.  Patient and spouse agreeable.  He did not recommend any further  aggressive intervention such as thoracentesis or paracentesis.  Social work arranging home hospice, hopefully can go home tomorrow. 2. Anasarca/ascites/pleural effusions: Multifactorial secondary to poor nutrition and metastatic malignancy. 3. Nausea and vomiting: Supportive treatment.  Comfort feeds as tolerated. 4. Acute kidney injury: Discontinued IV fluids.  Suspect will progressively worsen. 5. UTI: Discontinued antibiotics. 6. Symptomatic anemia: Transfused 1 unit of PRBCs.  Hemoglobin up to 8.8. 7. Thrombocytopenia: Platelets 109. 8. Sinus tachycardia: Seems to be chronic issue. 9. Cauda equina syndrome with neurogenic bladder: Reportedly has chronic Foley since last discharge. 10. History of pulmonary embolism: All medications not pertinent to comfort were discontinued after discussing with spouse. 11. Stage II-3 sacral decubitus ulcers: Air overlay mattress for comfort. 12. Adult failure to thrive: Secondary to widely metastatic malignancy.  Overall poor prognosis.  As per discussion with oncology, patient is terminal and prognosis of weeks.  Pressure Injury 06/20/19 Coccyx Mid Stage II -  Partial thickness loss of dermis presenting as a shallow open ulcer with a red, pink wound bed without slough. (Active)  06/20/19 2102  Location: Coccyx  Location Orientation: Mid  Staging: Stage II -  Partial thickness loss of dermis presenting as a shallow open ulcer with a red, pink wound bed without slough.  Wound Description (Comments):   Present on Admission: Yes        DVT prophylaxis: None.  Full comfort care. Code Status: After discussing in detail with spouse, he is agreeable to complete DNR, changed. Family Communication: Discussed in detail with patient spouse, updated care and answered questions. Disposition: DC home with hospice, likely 10/24   Consultants:  Oncology  Procedures:  Has indwelling Foley catheter  Antimicrobials:  Ceftriaxone-discontinued  Subjective:  Seen this morning.  Intermittent nausea and vomiting.  Some dyspnea.  No pain.  Objective:  Vitals:   07/12/19 0906 07/12/19 1123 07/12/19 1130 07/12/19 1330  BP: 109/68   (!) 90/59  Pulse: (!) 125 (!) 131  (!) 113  Resp: (!) 23  18 19   Temp: 98.8 F (37.1 C)   98.4 F (36.9 C)  TempSrc: Oral   Axillary  SpO2: 95%   92%  Weight:      Height:        Examination:  General exam: Young female, moderately built and overweight, chronically ill looking, propped up in bed with mild respiratory distress. Respiratory system: Reduced breath sounds bilaterally left > right.  Mild increased work of breathing. Cardiovascular system: S1 & S2 heard, regular tachycardia.  No JVD.  3/6 systolic murmur at apex.  Anasarca with 3+ pitting bilateral leg edema extending up to anterior abdominal wall and sacrum. Gastrointestinal system: Abdomen is distended, almost tense, nontender.  No organomegaly or masses appreciated.  Ascites + +. Central nervous system: Alert and oriented x2. No focal neurological deficits. Extremities: Symmetric 5 x 5 power in upper extremities and at least grade 2 x 5 in lower extremities. Skin: Multiple sacral decubitus ulcer stage II-3 without obvious infection. Psychiatry: Judgement and insight appear impaired. Mood & affect appropriate.     Data Reviewed: I have personally reviewed following labs and imaging studies  CBC: Recent Labs  Lab 07/11/19 1758 07/12/19 0153 07/12/19 0841  WBC 7.9 8.6 8.1  NEUTROABS 7.3  --   --   HGB 7.1* 7.1* 8.8*  HCT 23.0* 22.7* 27.6*  MCV 87.1 87.3 86.0  PLT 139* 124* 251*   Basic Metabolic Panel: Recent Labs  Lab 07/11/19 1758 07/12/19 0153 07/12/19 0841  NA 139 138 139  K 3.9 3.8 3.5  CL 106 106 105  CO2 23 22 22   GLUCOSE 88 78 79  BUN 51* 52* 55*  CREATININE 1.75* 1.71* 1.58*  CALCIUM 8.0* 8.2* 8.0*   Liver Function Tests: Recent Labs  Lab 07/11/19 1758 07/12/19 0153 07/12/19 0841  AST 143* 115* 81*  ALT 69*  68* 63*  ALKPHOS 106 109 110  BILITOT 0.1* 0.7 0.6  PROT 4.5* 5.1* 5.1*  ALBUMIN 1.4* 1.5* 1.7*    Cardiac Enzymes: No results for input(s): CKTOTAL, CKMB, CKMBINDEX, TROPONINI in the last 168 hours.  CBG: Recent Labs  Lab 07/12/19 0416 07/12/19 0445  GLUCAP 62* 112*    Recent Results (from the past 240 hour(s))  Body fluid culture     Status: None   Collection Time: 07/03/19  3:44 PM   Specimen: Lung, Right; Pleural Fluid  Result Value Ref Range Status   Specimen Description   Final    PLEURAL  RIGHT LUNG Performed at [facility] [address], [place], [place] [address]    Special Requests   Final    NONE Performed at [facility], [place] [address], [place], [place] [address]    Gram Stain   Final    RARE WBC PRESENT, PREDOMINANTLY PMN NO ORGANISMS SEEN    Culture   Final    NO GROWTH 3 DAYS Performed at [facility] [address] [place], [place] [address]    Report Status 07/07/2019 FINAL  Final  Blood Culture (routine x 2)     Status: None (Preliminary result)   Collection Time: 07/11/19  5:58 PM   Specimen: BLOOD  Result Value  Ref Range Status   Specimen Description   Final    BLOOD LEFT ANTECUBITAL Performed at [facility] [address], [place], [place] [address]    Special Requests   Final    BOTTLES DRAWN AEROBIC AND ANAEROBIC Blood Culture adequate volume Performed at [facility] [address], [place], [place] [address]    Culture   Final    NO GROWTH < 12 HOURS Performed at [facility] [address] [place], [place] [address]    Report Status PENDING  Incomplete  Blood Culture (routine x 2)     Status: None (Preliminary result)   Collection Time: 07/11/19  6:15 PM   Specimen: BLOOD  Result Value Ref Range Status   Specimen Description   Final    BLOOD RIGHT ANTECUBITAL Performed at [facility] [address],  [place], [place] [address]    Special Requests   Final    BOTTLES DRAWN AEROBIC AND ANAEROBIC Blood Culture adequate volume Performed at [facility] [address], [place], [place] [address]    Culture   Final    NO GROWTH < 12 HOURS Performed at [facility] [address], [place], [place] [address]    Report Status PENDING  Incomplete  SARS CORONAVIRUS 2 (TAT 6-24 HRS) Nasopharyngeal Nasopharyngeal Swab     Status: None   Collection Time: 07/11/19  6:19 PM   Specimen: Nasopharyngeal Swab  Result Value Ref Range Status   SARS Coronavirus 2 NEGATIVE NEGATIVE Final    Comment: (NOTE) SARS-CoV-2 target nucleic acids are NOT DETECTED. The SARS-CoV-2 RNA is generally detectable in upper and lower respiratory specimens during the acute phase of infection. Negative results do not preclude SARS-CoV-2 infection, do not rule out co-infections with other pathogens, and should not be used as the sole basis for treatment or other patient management decisions. Negative results must be combined with clinical observations, patient history, and epidemiological information. The expected result is Negative. Fact Sheet for Patients: SugarRoll.be Fact Sheet for Healthcare Providers: https://www.woods-mathews.com/ This test is not yet approved or cleared by the [place] FDA and  has been authorized for detection and/or diagnosis of SARS-CoV-2 by FDA under an Emergency Use Authorization (EUA). This EUA will remain  in effect (meaning this test can be used) for the duration of the COVID-19 declaration under Section 56 4(b)(1) of the Act, 21 U.S.C. section 360bbb-3(b)(1), unless the authorization is terminated or revoked sooner. Performed at [facility], [place] [address] [place], [place] [address]          Radiology Studies: US Abdomen Complete  Result Date: 07/12/2019 CLINICAL DATA:  Ascites, hypertension,  metastatic RIGHT breast cancer EXAM: ABDOMEN ULTRASOUND COMPLETE COMPARISON:  CT abdomen and pelvis 06/21/2019 FINDINGS: Gallbladder: Thickened gallbladder wall, a nonspecific finding in the setting of ascites. No gallstones or sonographic Murphy sign. Small amount of sludge present at the lower gallbladder segment. Common bile duct: Diameter: Normal caliber 5 mm diameter Liver: Slightly increased hepatic echogenicity, question fatty infiltration, though this can be seen with cirrhosis and certain infiltrative disorders. Hepatic margins appear smooth. No definite focal hepatic mass. Portal vein is patent on color Doppler imaging with normal direction of blood flow towards the liver. IVC: Normal appearance Pancreas: Small portion of pancreatic body normal appearance, remainder obscured by bowel gas Spleen: Not adequately visualized; prior CT demonstrates spleen is small and located in a high subcostal position Right Kidney:  Length: 10.5 cm. Normal morphology without mass or hydronephrosis. Left Kidney: Inadequately visualized due to bowel gas and body habitus Abdominal aorta: Normal caliber Other findings: Scattered ascites. IMPRESSION: Scattered ascites. Thickened gallbladder wall which is nonspecific in the setting of ascites. Small amount of gallbladder sludge without definite discrete gallstones. Suspect mild fatty infiltration of liver. Inadequate visualization of pancreas, spleen and LEFT kidney. Electronically Signed   By: Lavonia Dana M.D.   On: 07/12/2019 10:05   Dg Chest Port 1 View  Result Date: 07/11/2019 CLINICAL DATA:  Fever EXAM: PORTABLE CHEST 1 VIEW COMPARISON:  07/03/2019 FINDINGS: Right-sided chest port remains in place. Cardiac silhouette is largely obscured. Masslike thickening of the lower right paratracheal stripe, similar to finding on recent CT. Large bilateral pleural effusions, left greater than right, which appear increased in size compared to prior study. Bibasilar opacities favor  atelectasis. No pneumothorax. IMPRESSION: 1. Large bilateral pleural effusions, left greater than right, increased in size compared to prior. 2. Masslike thickening of the right paratracheal stripe is similar in appearance to recent CT. 3. Bibasilar opacities favor atelectasis. Electronically Signed   By: Davina Poke M.D.   On: 07/11/2019 18:21   Dg Abd Portable 1 View  Result Date: 07/11/2019 CLINICAL DATA:  Vomiting, weakness EXAM: PORTABLE ABDOMEN - 1 VIEW COMPARISON:  06/24/2019 FINDINGS: Examination is somewhat limited secondary to poor penetration from patient body habitus. Nonspecific bowel gas pattern without evidence of obstruction. No gross free intraperitoneal air on this portable supine film. Osseous structures appear intact. IMPRESSION: Slightly limited exam.  Bowel gas pattern appears nonspecific. Electronically Signed   By: Davina Poke M.D.   On: 07/11/2019 18:22        Scheduled Meds: . Chlorhexidine Gluconate Cloth  6 each Topical Daily  . feeding supplement  1 Container Oral BID BM  . feeding supplement (ENSURE ENLIVE)  237 mL Oral Q24H  . gabapentin  100 mg Oral q morning - 10a  . metoprolol tartrate  50 mg Oral BID  . mirtazapine  7.5 mg Oral QHS  . multivitamin with minerals  1 tablet Oral Daily  . nutrition supplement (JUVEN)  1 packet Oral BID BM  . pantoprazole (PROTONIX) IV  40 mg Intravenous Q24H  . pravastatin  40 mg Oral Daily  . QUEtiapine  25 mg Oral Daily  . rivaroxaban  20 mg Oral Daily  . tiZANidine  4 mg Oral TID   Continuous Infusions: . sodium chloride 50 mL/hr at 07/12/19 1122  . cefTRIAXone (ROCEPHIN)  IV Stopped (07/12/19 0248)     LOS: 1 day     Vernell Leep, MD, FACP, [facility]. [facility]  To contact the attending provider between 7A-7P or the covering provider during after hours 7P-7A, please log into the web site [email or web address] and access using universal [place] password for that web site. If you do not have the  password, please call the hospital operator.  07/12/2019, 5:26 PM

## 2019-07-12 NOTE — Progress Notes (Signed)
MD ordered to hold second unit of PRBC's at this time and repeat labs. Blood bank made aware.

## 2019-07-12 NOTE — Progress Notes (Signed)
HEMATOLOGY-ONCOLOGY PROGRESS NOTE  SUBJECTIVE: Patient has been admitted with failure to thrive nausea and vomiting, decreased oral intake and abdominal distention along with shortness of breath.  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    06/28/2019 -  Chemotherapy   The patient had palonosetron (ALOXI) injection 0.25 mg, 0.25 mg, Intravenous,  Once, 1 of 4 cycles CARBOplatin (PARAPLATIN) 530 mg in sodium chloride 0.9 % 250 mL chemo infusion, 530 mg (100 % of original dose 532.5 mg), Intravenous,  Once, 1 of 4 cycles Dose modification:   (original dose 532.5 mg, Cycle 1) fosaprepitant (EMEND) 150 mg, dexamethasone (DECADRON) 12 mg in sodium chloride 0.9 % 145 mL IVPB, , Intravenous,  Once, 1 of 4 cycles  for chemotherapy treatment.      OBJECTIVE: REVIEW OF SYSTEMS:   Patient is somnolent and review of system cannot be obtained. Nurses reported that she was throwing up this morning.  She was given Phenergan which improved her symptoms and she is sleeping.  PHYSICAL EXAMINATION: ECOG PERFORMANCE STATUS: 4 - Bedbound  Vitals:   07/12/19 1130 07/12/19 1330  BP:  (!) 90/59  Pulse:  (!) 113  Resp: 18 19  Temp:  98.4 F (36.9 C)  SpO2:  92%   Filed Weights   07/12/19 0033  Weight: 174 lb 1.6 oz (79 kg)    GENERAL somnolent and not answering questions she does wake up LUNGS: Diminished breath sounds bilaterally HEART: regular rate & rhythm and no murmurs and no lower extremity edema ABDOMEN: Abdominal distention Musculoskeletal:no cyanosis of digits and no clubbing  NEURO: Unable to examine patient has been sleeping  LABORATORY DATA:  I have reviewed the data as listed CMP Latest Ref Rng & Units 07/12/2019 07/12/2019  07/11/2019  Glucose 70 - 99 mg/dL 79 78 88  BUN 6 - 20 mg/dL 55(H) 52(H) 51(H)  Creatinine 0.44 - 1.00 mg/dL 1.58(H) 1.71(H) 1.75(H)  Sodium 135 - 145 mmol/L 139 138 139  Potassium 3.5 - 5.1 mmol/L 3.5 3.8 3.9  Chloride 98 - 111 mmol/L 105 106 106  CO2 22 - 32 mmol/L 22 22 23   Calcium 8.9 - 10.3 mg/dL 8.0(L) 8.2(L) 8.0(L)  Total Protein 6.5 - 8.1 g/dL 5.1(L) 5.1(L) 4.5(L)  Total Bilirubin 0.3 - 1.2 mg/dL 0.6 0.7 0.1(L)  Alkaline Phos 38 - 126 U/L 110 109 106  AST 15 - 41 U/L 81(H) 115(H) 143(H)  ALT 0 - 44 U/L 63(H) 68(H) 69(H)    Lab Results  Component Value Date   WBC 8.1 07/12/2019   HGB 8.8 (L) 07/12/2019   HCT 27.6 (L) 07/12/2019   MCV 86.0 07/12/2019   PLT 109 (L) 07/12/2019   NEUTROABS 7.3 07/11/2019    ASSESSMENT AND PLAN: Metastatic breast cancer: I discussed with the patient's husband that there is widespread disease and no amount of chemotherapy will make her live any longer and would not improve her quality of life. I also spoke with Dr. Wetzel Bjornstad who is also in agreement regarding palliative care/hospice. I also do not recommend doing any aggressive interventions like thoracentesis or paracentesis because it may not really improve her quality of life significantly. I informed him that our social workers will contact him regarding hospice care options.  I suspect that she may qualify for  inpatient hospice unit but it will be the decision of the hospice unit and availability. I anticipate that she would have fairly short life expectancy measured in weeks.

## 2019-07-12 NOTE — Progress Notes (Signed)
   07/12/19 0047  Vitals  Temp 99.1 F (37.3 C)  Temp Source Oral  BP 105/68  MAP (mmHg) 79  BP Location Right Arm  BP Method Automatic  Patient Position (if appropriate) Lying  Pulse Rate (!) 122  Pulse Rate Source Monitor  Resp (!) 26  Oxygen Therapy  SpO2 98 %  O2 Device Nasal Cannula  O2 Flow Rate (L/min) 3 L/min  MEWS Score  MEWS RR 2  MEWS Pulse 2  MEWS Systolic 0  MEWS LOC 0  MEWS Temp 0  MEWS Score 4  MEWS Score Color Red  MEWS Assessment  Is this an acute change? Yes  MEWS guidelines implemented *See [place]  Provider Notification  Provider Name/Title Jonelle Sidle  Date Provider Notified 07/12/19  Time Provider Notified [phone]  Notification Type Page  Notification Reason Other (Comment) (IVF, medications, SOB, confusion)  Response Other (Comment) (hold IVF and metoprolol, give blood)  Date of Provider Response 07/12/19  Time of Provider Response 0050  Rapid Response Notification  Name of Rapid Response RN Notified Hans  Date Rapid Response Notified 07/12/19  Time Rapid Response Notified [id number]

## 2019-07-12 NOTE — Progress Notes (Signed)
PHARMACY - PHYSICIAN COMMUNICATION CRITICAL VALUE ALERT - BLOOD CULTURE IDENTIFICATION (BCID)  Kaylee Randolph is an 53 y.o. female who presented to [facility] on 07/11/2019 with a chief complaint of ARF  Assessment:  Patient is noted to transition to hospice care and antibiotics for UTI were already D/C.   (include suspected source if known)  Name of physician (or Provider) Contacted: Dr. Maudie Mercury  Current antibiotics: none  Changes to prescribed antibiotics recommended:  No change at this time.  1/4 bottles likely contamination--continue with no antibiotics plan.  Results for orders placed or performed during the hospital encounter of 06/20/19  Blood Culture ID Panel (Reflexed) (Collected: 06/26/2019  1:40 PM)  Result Value Ref Range   Enterococcus species NOT DETECTED NOT DETECTED   Listeria monocytogenes NOT DETECTED NOT DETECTED   Staphylococcus species DETECTED (A) NOT DETECTED   Staphylococcus aureus (BCID) NOT DETECTED NOT DETECTED   Methicillin resistance DETECTED (A) NOT DETECTED   Streptococcus species NOT DETECTED NOT DETECTED   Streptococcus agalactiae NOT DETECTED NOT DETECTED   Streptococcus pneumoniae NOT DETECTED NOT DETECTED   Streptococcus pyogenes NOT DETECTED NOT DETECTED   Acinetobacter baumannii NOT DETECTED NOT DETECTED   Enterobacteriaceae species NOT DETECTED NOT DETECTED   Enterobacter cloacae complex NOT DETECTED NOT DETECTED   Escherichia coli NOT DETECTED NOT DETECTED   Klebsiella oxytoca NOT DETECTED NOT DETECTED   Klebsiella pneumoniae NOT DETECTED NOT DETECTED   Proteus species NOT DETECTED NOT DETECTED   Serratia marcescens NOT DETECTED NOT DETECTED   Haemophilus influenzae NOT DETECTED NOT DETECTED   Neisseria meningitidis NOT DETECTED NOT DETECTED   Pseudomonas aeruginosa NOT DETECTED NOT DETECTED   Candida albicans NOT DETECTED NOT DETECTED   Candida glabrata NOT DETECTED NOT DETECTED   Candida krusei NOT DETECTED NOT DETECTED   Candida  parapsilosis NOT DETECTED NOT DETECTED   Candida tropicalis NOT DETECTED NOT DETECTED    Kaylee Randolph 07/12/2019  11:00 PM

## 2019-07-13 LAB — CULTURE, BLOOD (ROUTINE X 2): Special Requests: ADEQUATE

## 2019-07-13 MED ORDER — MORPHINE SULFATE (CONCENTRATE) 10 MG/0.5ML PO SOLN: 5.0000 mg | ORAL | 0 refills | Status: AC | PRN

## 2019-07-13 MED ORDER — HEPARIN SOD (PORK) LOCK FLUSH 100 UNIT/ML IV SOLN
500.0000 [IU] | INTRAVENOUS | Status: AC | PRN
  Administered 2019-07-13: 17:00:00 500 [IU]

## 2019-07-13 MED ORDER — LIP MEDEX EX OINT: TOPICAL_OINTMENT | CUTANEOUS | Status: DC | PRN

## 2019-07-13 MED ORDER — LIP MEDEX EX OINT
TOPICAL_OINTMENT | CUTANEOUS | Status: AC
  Administered 2019-07-13: 09:00:00
  Filled 2019-07-13: qty 7

## 2019-07-13 NOTE — Discharge Summary (Signed)
Physician Discharge Summary  ELDRED LIEVANOS OYD:[id number] DOB: Jan 12, 1966  PCP: Maury Dus, MD  Admitted from: Home Discharged to: Home with hospice  Admit date: 07/11/2019 Discharge date: 07/13/2019  Recommendations for Outpatient Follow-up:   Follow-up Information    Maury Dus, MD. Call.   Specialty: Family Medicine Why: As needed. Contact information: [place] [address] [phone]        Nicholas Lose, MD. Call.   Specialty: Hematology and Oncology Why: He will be your Hospice Doctor. Contact information: [place] [phone] [place]: None Equipment/Devices: Patient will be going home with oxygen for comfort and her chronic indwelling Foley catheter.  Discharge Condition: Poor prognosis CODE STATUS: DNR Diet recommendation: Regular diet.  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  Home health RN noted her dyspnea and advised her to come to ED. Critically ill and poor overall prognosis. Dr. Sonny Dandy with Oncology met and discussed with patient/spouse and transitioned to comfort care/hospice.    Assessment & Plan:  1. Metastatic breast cancer: Dr. Sonny Dandy, Oncology evaluated patient, met with patient and spouse and recommended transitioning to hospice.  Patient and spouse agreeable.  He did not recommend any further aggressive intervention such as thoracentesis or paracentesis.  TOC team consulted and assisted with arranging home hospice.  Patient to be discharged with oxygen for comfort and her indwelling Foley catheter.  Fairly short life expectancy in weeks if not shorter, expected.  Discontinued all medications nonessential to comfort. 2. Anasarca/ascites/pleural effusions: Multifactorial secondary to poor nutrition and metastatic malignancy. 3. Nausea and vomiting: Supportive treatment.  Comfort feeds as tolerated. 4. Acute kidney injury: Discontinued IV fluids.  Suspect will progressively worsen. 5. UTI: Discontinued antibiotics.  Urine culture shows greater than 100 K colonies per mL of yeast, suspect colonization. 6. Symptomatic anemia: Transfused 1 unit of PRBCs.  Hemoglobin up to 8.8. 7. Thrombocytopenia: Platelets 109. 8. Sinus tachycardia: Seems to be chronic issue. 9. Cauda equina syndrome with neurogenic bladder: Reportedly has chronic Foley since last discharge. 10. History of pulmonary embolism: All medications not pertinent to comfort were discontinued after discussing with spouse. 11. Stage II-3 sacral decubitus ulcers: Air overlay mattress for comfort. 12. Adult failure to thrive: Secondary to widely metastatic malignancy.  Overall poor prognosis.  As per discussion with oncology, patient is terminal and prognosis of weeks. 13. 1 of 2 sets of blood culture positive for coagulase-negative staph: Suspect contamination.  Now full  comfort care.  Pressure Injury 06/20/19 Coccyx Mid Stage II -  Partial thickness loss of dermis presenting as a shallow open ulcer with a red, pink wound bed without slough. (Active)  06/20/19 2102  Location: Coccyx  Location Orientation: Mid  Staging: Stage II -  Partial thickness loss of dermis presenting as a shallow open ulcer with a red, pink wound bed without slough.  Wound Description (Comments):   Present on Admission: Yes    Consultants:  Oncology   Discharge Instructions  Discharge Instructions    Call MD for:  difficulty breathing, headache or visual disturbances   Complete by: As directed    Call MD for:  persistant nausea and vomiting   Complete by: As directed    Call MD for:  severe uncontrolled pain   Complete by: As directed    Call MD for:  temperature >100.4   Complete by: As directed    Diet general   Complete by: As directed    Increase activity slowly   Complete by: As directed        Medication List    STOP taking these medications   acetaminophen 500 MG tablet Commonly known as: TYLENOL   amLODipine 5 MG tablet Commonly known as: NORVASC   furosemide 40 MG tablet Commonly known as: Lasix   gabapentin 100 MG capsule Commonly known as: NEURONTIN   hydrochlorothiazide 12.5 MG capsule Commonly known as: MICROZIDE   losartan 100 MG tablet Commonly known as: COZAAR   metoprolol tartrate 50 MG tablet Commonly known as: LOPRESSOR   multivitamin with minerals Tabs tablet   oxyCODONE 5 MG immediate release tablet Commonly known as: Oxy IR/ROXICODONE   pravastatin 40 MG tablet Commonly known as: PRAVACHOL   rivaroxaban 20 MG Tabs tablet Commonly known as: XARELTO   tiZANidine 4 MG tablet Commonly known as: ZANAFLEX     TAKE these medications   hyoscyamine 0.125 MG tablet Commonly known as: LEVSIN Take 0.125-0.25 mg by mouth every 4 (four) hours as needed for cramping.   mirtazapine 7.5 MG tablet Commonly known as:  REMERON Take 7.5 mg by mouth at bedtime.   morphine CONCENTRATE 10 MG/0.5ML Soln concentrated solution Place 0.25 mLs (5 mg total) under the tongue every 3 (three) hours as needed for moderate pain, severe pain, anxiety or shortness of breath.   ondansetron 4 MG tablet Commonly known as: ZOFRAN Take 4 mg by mouth every 6 (six) hours as needed for nausea or vomiting.   prochlorperazine 5 MG tablet Commonly known as: COMPAZINE Take 5 mg by mouth every 6 (six) hours as needed for nausea or vomiting.   QUEtiapine 25 MG tablet Commonly known as: SEROQUEL Take 1 tablet (25 mg total) by mouth daily.      No Known Allergies    Procedures/Studies: US Abdomen Complete  Result Date: 07/12/2019 CLINICAL DATA:  Ascites, hypertension, metastatic RIGHT breast cancer EXAM: ABDOMEN ULTRASOUND COMPLETE COMPARISON:  CT abdomen and pelvis 06/21/2019 FINDINGS: Gallbladder: Thickened gallbladder wall, a nonspecific finding in the setting of ascites. No gallstones or sonographic Murphy sign. Small amount of sludge present at the lower gallbladder segment. Common bile duct: Diameter: Normal caliber 5 mm diameter Liver: Slightly increased hepatic echogenicity, question fatty infiltration, though this can be seen with cirrhosis and certain infiltrative disorders. Hepatic margins appear smooth. No definite focal hepatic mass. Portal vein is patent on color Doppler imaging with normal direction of blood flow towards the liver. IVC: Normal appearance Pancreas: Small portion of pancreatic body normal appearance, remainder obscured by bowel gas Spleen: Not adequately visualized; prior  CT demonstrates spleen is small and located in a high subcostal position Right Kidney: Length: 10.5 cm. Normal morphology without mass or hydronephrosis. Left Kidney: Inadequately visualized due to bowel gas and body habitus Abdominal aorta: Normal caliber Other findings: Scattered ascites. IMPRESSION: Scattered ascites. Thickened  gallbladder wall which is nonspecific in the setting of ascites. Small amount of gallbladder sludge without definite discrete gallstones. Suspect mild fatty infiltration of liver. Inadequate visualization of pancreas, spleen and LEFT kidney. Electronically Signed   By: Lavonia Dana M.D.   On: 07/12/2019 10:05   Dg Chest Port 1 View  Result Date: 07/11/2019 CLINICAL DATA:  Fever EXAM: PORTABLE CHEST 1 VIEW COMPARISON:  07/03/2019 FINDINGS: Right-sided chest port remains in place. Cardiac silhouette is largely obscured. Masslike thickening of the lower right paratracheal stripe, similar to finding on recent CT. Large bilateral pleural effusions, left greater than right, which appear increased in size compared to prior study. Bibasilar opacities favor atelectasis. No pneumothorax. IMPRESSION: 1. Large bilateral pleural effusions, left greater than right, increased in size compared to prior. 2. Masslike thickening of the right paratracheal stripe is similar in appearance to recent CT. 3. Bibasilar opacities favor atelectasis. Electronically Signed   By: Davina Poke M.D.   On: 07/11/2019 18:21   Dg Abd Portable 1 View  Result Date: 07/11/2019 CLINICAL DATA:  Vomiting, weakness EXAM: PORTABLE ABDOMEN - 1 VIEW COMPARISON:  06/24/2019 FINDINGS: Examination is somewhat limited secondary to poor penetration from patient body habitus. Nonspecific bowel gas pattern without evidence of obstruction. No gross free intraperitoneal air on this portable supine film. Osseous structures appear intact. IMPRESSION: Slightly limited exam.  Bowel gas pattern appears nonspecific. Electronically Signed   By: Davina Poke M.D.   On: 07/11/2019 18:22    Subjective: Patient alert and oriented x2.  Overall feels poorly.  Ongoing intermittent dyspnea, nausea but denies pain.  Asking if her husband would be by today.  Discharge Exam:  Vitals:   07/12/19 2135 07/12/19 2253 07/13/19 0558 07/13/19 1258  BP: (!) 84/48  107/61 105/65 113/79  Pulse: (!) 115 (!) 122 (!) 123 (!) 142  Resp: (!) 24 (!) _0 Temp: 99 F (37.2 C) 99 F (37.2 C) 98.8 F (37.1 C) 99.3 F (37.4 C)  TempSrc: Oral Oral Oral Oral  SpO2: 92% 90% 92% 94%  Weight:      Height:        General exam: Young Randolph, moderately built and overweight, chronically ill looking, propped up in bed without obvious distress. Respiratory system: Reduced breath sounds bilaterally left > right.  Breathing appears better today than yesterday and she does not appear in any distress. Cardiovascular system: S1 & S2 heard, regular tachycardia.  No JVD.  3/6 systolic murmur at apex.  Anasarca with 3+ pitting bilateral leg edema extending up to anterior abdominal wall and sacrum. Gastrointestinal system: Abdomen is distended, almost tense, nontender.  No organomegaly or masses appreciated.  Ascites + +. Central nervous system: Alert and oriented x2. No focal neurological deficits. Extremities: Symmetric 5 x 5 power in upper extremities and at least grade 2 x 5 in lower extremities. Skin: Multiple sacral decubitus ulcer stage II-3 without obvious infection. Psychiatry: Judgement and insight appear impaired. Mood & affect appropriate.     The results of significant diagnostics from this hospitalization (including imaging, microbiology, ancillary and laboratory) are listed below for reference.     Microbiology: Recent Results (from the past 240 hour(s))  Blood Culture (routine x 2)  Status: None (Preliminary result)   Collection Time: 07/11/19  5:58 PM   Specimen: BLOOD  Result Value Ref Range Status   Specimen Description   Final    BLOOD LEFT ANTECUBITAL Performed at [facility] [address] [place], [place] [address]    Special Requests   Final    BOTTLES DRAWN AEROBIC AND ANAEROBIC Blood Culture adequate volume Performed at [facility] [address], [place], [place] [address]    Culture    Final    NO GROWTH 2 DAYS Performed at [facility] [address], [place], [place] [address]    Report Status PENDING  Incomplete  Blood Culture (routine x 2)     Status: Abnormal   Collection Time: 07/11/19  6:15 PM   Specimen: BLOOD  Result Value Ref Range Status   Specimen Description   Final    BLOOD RIGHT ANTECUBITAL Performed at [facility] [address] [place], [place] [address]    Special Requests   Final    BOTTLES DRAWN AEROBIC AND ANAEROBIC Blood Culture adequate volume Performed at [facility] [address] [place], [place] [address]    Culture  Setup Time   Final    AEROBIC BOTTLE ONLY GRAM POSITIVE COCCI CRITICAL RESULT CALLED TO, READ BACK BY AND VERIFIED WITH: Lavell Luster [facility] 07/12/19 2233 JDW    Culture (A)  Final    STAPHYLOCOCCUS SPECIES (COAGULASE NEGATIVE) THE SIGNIFICANCE OF ISOLATING THIS ORGANISM FROM A SINGLE SET OF BLOOD CULTURES WHEN MULTIPLE SETS ARE DRAWN IS UNCERTAIN. PLEASE NOTIFY THE MICROBIOLOGY DEPARTMENT WITHIN ONE WEEK IF SPECIATION AND SENSITIVITIES ARE REQUIRED. Performed at [facility], [place] [address], [place], [place] [address]    Report Status 07/13/2019 FINAL  Final  SARS CORONAVIRUS 2 (TAT 6-24 HRS) Nasopharyngeal Nasopharyngeal Swab     Status: None   Collection Time: 07/11/19  6:19 PM   Specimen: Nasopharyngeal Swab  Result Value Ref Range Status   SARS Coronavirus 2 NEGATIVE NEGATIVE Final    Comment: (NOTE) SARS-CoV-2 target nucleic acids are NOT DETECTED. The SARS-CoV-2 RNA is generally detectable in upper and lower respiratory specimens during the acute phase of infection. Negative results do not preclude SARS-CoV-2 infection, do not rule out co-infections with other pathogens, and should not be used as the sole basis for treatment or other patient management decisions. Negative results must be combined with clinical observations, patient history, and  epidemiological information. The expected result is Negative. Fact Sheet for Patients: SugarRoll.be Fact Sheet for Healthcare Providers: https://www.woods-mathews.com/ This test is not yet approved or cleared by the [place] FDA and  has been authorized for detection and/or diagnosis of SARS-CoV-2 by FDA under an Emergency Use Authorization (EUA). This EUA will remain  in effect (meaning this test can be used) for the duration of the COVID-19 declaration under Section 56 4(b)(1) of the Act, 21 U.S.C. section 360bbb-3(b)(1), unless the authorization is terminated or revoked sooner. Performed at [facility], [place] [address] [place], [place] [address]   Urine culture     Status: Abnormal   Collection Time: 07/11/19  8:58 PM   Specimen: In/Out Cath Urine  Result Value Ref Range Status   Specimen Description   Final    IN/OUT CATH URINE Performed at [facility] [address] [place], [place] [address]    Special Requests   Final    NONE Performed at [facility]  [place] [address], [place], [place] [address]    Culture >=100,000 COLONIES/mL YEAST (A)  Final   Report Status 07/12/2019 FINAL  Final     Labs: CBC: Recent Labs  Lab 07/11/19 1758 07/12/19 0153 07/12/19 0841  WBC 7.9 8.6 8.1  NEUTROABS 7.3  --   --   HGB 7.1* 7.1* 8.8*  HCT 23.0* 22.7* 27.6*  MCV 87.1 87.3 86.0  PLT 139* 124* 832*   Basic Metabolic Panel: Recent Labs  Lab 07/11/19 1758 07/12/19 0153 07/12/19 0841  NA 139 138 139  K 3.9 3.8 3.5  CL 106 106 105  CO2 _0 GLUCOSE 88 78 79  BUN 51* 52* 55*  CREATININE 1.75* 1.71* 1.58*  CALCIUM 8.0* 8.2* 8.0*   Liver Function Tests: Recent Labs  Lab 07/11/19 1758 07/12/19 0153 07/12/19 0841  AST 143* 115* 81*  ALT 69* 68* 63*  ALKPHOS 106 109 110  BILITOT 0.1* 0.7 0.6  PROT 4.5* 5.1* 5.1*  ALBUMIN 1.4* 1.5* 1.7*   BNP (last 3 results) Recent  Labs    07/01/19 0353  BNP 64.9   Cardiac Enzymes: No results for input(s): CKTOTAL, CKMB, CKMBINDEX, TROPONINI in the last 168 hours. CBG: Recent Labs  Lab 07/12/19 0416 07/12/19 0445  GLUCAP 62* 112*   Urinalysis    Component Value Date/Time   COLORURINE AMBER (A) 07/11/2019 2058   APPEARANCEUR CLOUDY (A) 07/11/2019 2058   LABSPEC 1.021 07/11/2019 2058   PHURINE 5.0 07/11/2019 2058   GLUCOSEU NEGATIVE 07/11/2019 2058   HGBUR MODERATE (A) 07/11/2019 2058   BILIRUBINUR NEGATIVE 07/11/2019 2058   [place] NEGATIVE 07/11/2019 2058   PROTEINUR NEGATIVE 07/11/2019 2058   NITRITE NEGATIVE 07/11/2019 2058   LEUKOCYTESUR MODERATE (A) 07/11/2019 2058    I discussed in detail with patient spouse, updated care and answered questions.  Time coordinating discharge: 45 minutes  SIGNED:  Vernell Leep, MD, FACP, [facility]. [facility]  To contact the attending provider between 7A-7P or the covering provider during after hours 7P-7A, please log into the web site [email or web address] and access using universal [place] password for that web site. If you do not have the password, please call the hospital operator.

## 2019-07-13 NOTE — TOC Progression Note (Signed)
Transition of Care [facility]) - Progression Note    Patient Details  Name: Kaylee Randolph MRN: [id number] Date of Birth: 09-22-1965  Transition of Care [facility]) CM/SW Contact  Joaquin Courts, RN Phone Number: 07/13/2019, 1:19 PM  Clinical Narrative:    CM spoke with [place] who states she will be calling the spouse today to discuss hospice services and plan for dc home. Rep to notify CM once everything is in place for discharge.   Expected Discharge Plan: Home w Hospice Care Barriers to Discharge: Continued Medical Work up  Expected Discharge Plan and Services Expected Discharge Plan: [place]   Discharge Planning Services: CM Consult Post Acute Care Choice: Hospice Living arrangements for the past 2 months: Single Family Home                 DME Arranged: N/A DME Agency: NA       HH Arranged: NA HH Agency: NA         Social Determinants of Health (SDOH) Interventions    Readmission Risk Interventions Readmission Risk Prevention Plan 07/13/2019 06/24/2019  Transportation Screening Complete Complete  PCP or Specialist Appt within 3-5 Days - Not Complete  Not Complete comments - Not ready for dc  HRI or [place] - Complete  Social Work Consult for [place] Planning/Counseling - Not Complete  SW consult not completed comments - NA  Palliative Care Screening - Not Applicable  Medication Review Press photographer) Complete Complete  PCP or Specialist appointment within 3-5 days of discharge Complete -  [place] or Home Care Consult Complete -  SW Recovery Care/Counseling Consult Complete -  Palliative Care Screening Complete -  [place] Not Applicable -  Some recent data might be hidden

## 2019-07-13 NOTE — Plan of Care (Signed)
Reviewed discharge instructions with patient and her husband, questions answered, verbalized understanding.  Administered pain and nausea medicine prior to assisting patient to wheelchair for ride home.  Patient transported via wheelchair to main entrance, assisted into car to be taken home by spouse and son.

## 2019-07-13 NOTE — TOC Progression Note (Signed)
Transition of Care [facility]) - Progression Note    Patient Details  Name: Kaylee Randolph MRN: [id number] Date of Birth: 1966/03/25  Transition of Care [facility]) CM/SW Contact  Joaquin Courts, RN Phone Number: 07/13/2019, 4:09 PM  Clinical Narrative:    CM spoke with hospice rep, there is no availability for hospice to admit patient today, however patient can safely discharge home with her spouse as her caregiver and hospice nurse will be out tomorrow to admit to hospice services.    Expected Discharge Plan: Home w Hospice Care Barriers to Discharge: Continued Medical Work up  Expected Discharge Plan and Services Expected Discharge Plan: [place]   Discharge Planning Services: CM Consult Post Acute Care Choice: Hospice Living arrangements for the past 2 months: Single Family Home Expected Discharge Date: 07/13/19               DME Arranged: N/A DME Agency: NA       HH Arranged: NA HH Agency: NA         Social Determinants of Health (SDOH) Interventions    Readmission Risk Interventions Readmission Risk Prevention Plan 07/13/2019 06/24/2019  Transportation Screening Complete Complete  PCP or Specialist Appt within 3-5 Days - Not Complete  Not Complete comments - Not ready for dc  HRI or [place] - Complete  Social Work Consult for [place] Planning/Counseling - Not Complete  SW consult not completed comments - NA  Palliative Care Screening - Not Applicable  Medication Review Press photographer) Complete Complete  PCP or Specialist appointment within 3-5 days of discharge Complete -  [place] or Home Care Consult Complete -  SW Recovery Care/Counseling Consult Complete -  Palliative Care Screening Complete -  [place] Not Applicable -  Some recent data might be hidden

## 2019-07-13 NOTE — TOC Progression Note (Signed)
Transition of Care [facility]) - Progression Note    Patient Details  Name: Kaylee Randolph MRN: [id number] Date of Birth: 12/25/1965  Transition of Care [facility]) CM/SW Contact  Joaquin Courts, RN Phone Number: 07/13/2019, 1:35 PM  Clinical Narrative:    Hospice rep reached out to CM stating has attempted to contact spouse but not able to reach him and voicemail is full, spouse is not at the bedside. Cm spoke with bedside RN, should spouse come to visit please notify CM so hospice rep can be informed to call into patient's room to discuss dc planning for home with hospice services.   Expected Discharge Plan: Home w Hospice Care Barriers to Discharge: Continued Medical Work up  Expected Discharge Plan and Services Expected Discharge Plan: [place]   Discharge Planning Services: CM Consult Post Acute Care Choice: Hospice Living arrangements for the past 2 months: Single Family Home                 DME Arranged: N/A DME Agency: NA       HH Arranged: NA HH Agency: NA         Social Determinants of Health (SDOH) Interventions    Readmission Risk Interventions Readmission Risk Prevention Plan 07/13/2019 06/24/2019  Transportation Screening Complete Complete  PCP or Specialist Appt within 3-5 Days - Not Complete  Not Complete comments - Not ready for dc  HRI or [place] - Complete  Social Work Consult for [place] Planning/Counseling - Not Complete  SW consult not completed comments - NA  Palliative Care Screening - Not Applicable  Medication Review Press photographer) Complete Complete  PCP or Specialist appointment within 3-5 days of discharge Complete -  [place] or Home Care Consult Complete -  SW Recovery Care/Counseling Consult Complete -  Palliative Care Screening Complete -  [place] Not Applicable -  Some recent data might be hidden

## 2019-07-13 NOTE — Discharge Instructions (Signed)
Hospice °Hospice is a service that is designed to provide people who are terminally ill and their families with medical, spiritual, and psychological support. Its aim is to improve your quality of life by keeping you as comfortable as possible in the final stages of life. °Who will be my providers when I begin hospice care? °Hospice teams often include: °· A nurse. °· A doctor. The hospice doctor will be available for your care, but you can include your regular doctor or nurse practitioner. °· A social worker. °· A counselor. °· A religious leader (such as a chaplain). °· A dietitian. °· Therapists. °· Trained volunteers who can help with care. °What services does hospice provide? °Hospice services can vary depending on the center or organization. Generally, they include: °· Ways to keep you comfortable, such as: °? Providing care in your home or in a home-like setting. °? Working with your family and friends to help meet your needs. °? Allowing you to enjoy the support of loved ones by receiving much of your basic care from family and friends. °· Pain relief and symptom management. The staff will supply all necessary medicines and equipment so that you can stay comfortable and alert enough to enjoy the company of your friends and family. °· Visits or care from a nurse and doctor. This may include 24-hour on-call services. °· Companionship when you are alone. °· Allowing you and your family to rest. Hospice staff may do light housekeeping, prepare meals, and run errands. °· Counseling. They will make sure your emotional, spiritual, and social needs are being met, as well as those needs of your family members. °· Spiritual care. This will be individualized to meet your needs and your family's needs. It may involve: °? Helping you and your family understand the dying process. °? Helping you say goodbye to your family and friends. °? Performing a specific religious ceremony or ritual. °· Massage. °· Nutrition  therapy. °· Physical and occupational therapy. °· Short-term inpatient care, if something cannot be managed in the home. °· Art or music therapy. °· Bereavement support for grieving family members. °When should hospice care begin? °Most people who use hospice are believed to have less than 6 months to live. °· Your family and health care providers can help you decide when hospice services should begin. °· If you live longer than 6 months but your condition does not improve, your doctor may be able to approve you for continued hospice care. °· If your condition improves, you may discontinue the program. °What should I consider before selecting a program? °Most hospice programs are run by nonprofit, independent organizations. Some are affiliated with hospitals, nursing homes, or home health care agencies. Hospice programs can take place in your home or at a hospice center, hospital, or skilled nursing facility. When choosing a hospice program, ask the following questions: °· What services are available to me? °· What services will be offered to my loved ones? °· How involved will my loved ones be? °· How involved will my health care provider be? °· Who makes up the hospice care team? How are they trained or screened? °· How will my pain and symptoms be managed? °· If my circumstances change, can the services be provided in a different setting, such as my home or in the hospital? °· Is the program reviewed and licensed by the state or certified in some other way? °· What does it cost? Is it covered by insurance? °· If I choose a hospice   center or nursing home, where is the hospice center located? Is it convenient for family and friends? °· If I choose a hospice center or nursing home, can my family and friends visit any time? °· Will you provide emotional and spiritual support? °· Who can my family call with questions? °Where can I learn more about hospice? °You can learn about existing hospice programs in your area  from your health care providers. You can also read more about hospice online. The websites of the following organizations have helpful information: °· National Hospice and Palliative Care Organization (NHPCO): www.nhpco.org °· National Association for Home [employer] & Hospice (NAHC): www.nahc.org °· [employer] (HFA): www.hospicefoundation.org °· American Cancer Society (ACS): www.cancer.org °· [facility]: www.hospicenet.org °· [facility] [employer] (VNAA): www.vnaa.org °You may also find more information by contacting the following agencies: °· A local agency on aging. °· Your local [employer] chapter. °· Your state's department of health or social services. °Summary °· Hospice is a service that is designed to provide people who are terminally ill and their families with medical, spiritual, and psychological support. °· Hospice aims to improve your quality of life by keeping you as comfortable as possible in the final stages of life. °· Hospice teams often include a doctor, nurse, social worker, counselor, religious leader,dietitian, therapists, and volunteers. °· Hospice care generally includes medicine for symptom management, visits from doctors and nurses, physical and occupational therapy, nutrition counseling, spiritual and emotional counseling, caregiver support, and bereavement support for grieving family members. °· Hospice programs can take place in your home or at a hospice center, hospital, or skilled nursing facility. °This information is not intended to replace advice given to you by your health care provider. Make sure you discuss any questions you have with your health care provider. °Document Released: 12/23/2003 Document Revised: 08/18/2017 Document Reviewed: 09/27/2016 °Elsevier Patient Education © 2020 [employer] ° °

## 2019-07-14 DIAGNOSIS — I469 Cardiac arrest, cause unspecified: Secondary | ICD-10-CM | POA: Diagnosis not present

## 2019-07-14 DIAGNOSIS — R404 Transient alteration of awareness: Secondary | ICD-10-CM | POA: Diagnosis not present

## 2019-07-16 LAB — TYPE AND SCREEN
ABO/RH(D): A POS
Antibody Screen: NEGATIVE
Unit division: 0
Unit division: 0

## 2019-07-16 LAB — BPAM RBC
Blood Product Expiration Date: 202011152359
Blood Product Expiration Date: 202011152359
ISSUE DATE / TIME: 202010230344
Unit Type and Rh: 6200
Unit Type and Rh: 6200

## 2019-07-16 LAB — CULTURE, BLOOD (ROUTINE X 2)
Culture: NO GROWTH
Special Requests: ADEQUATE

## 2019-07-18 LAB — CULTURE, FUNGUS WITHOUT SMEAR

## 2019-07-21 DIAGNOSIS — 419620001 Death: Secondary | SNOMED CT | POA: Diagnosis not present

## 2019-07-21 DEATH — deceased

## 2019-07-29 LAB — FUNGUS CULTURE WITH STAIN

## 2019-07-29 LAB — FUNGAL ORGANISM REFLEX

## 2019-07-29 LAB — FUNGUS CULTURE RESULT

## 2019-08-09 LAB — ACID FAST CULTURE WITH REFLEXED SENSITIVITIES (MYCOBACTERIA): Acid Fast Culture: NEGATIVE

## 2021-08-23 IMAGING — DX DG CHEST 1V
1 series · 1 of 1 positions shown · non-contrast
Comparison: Chest radiograph dated 07/01/[address] dated
07/03/2019

CLINICAL DATA: 53-year-old female status post right thoracentesis.
History of breast cancer.

EXAM:
CHEST  1 VIEW

[chest ap]
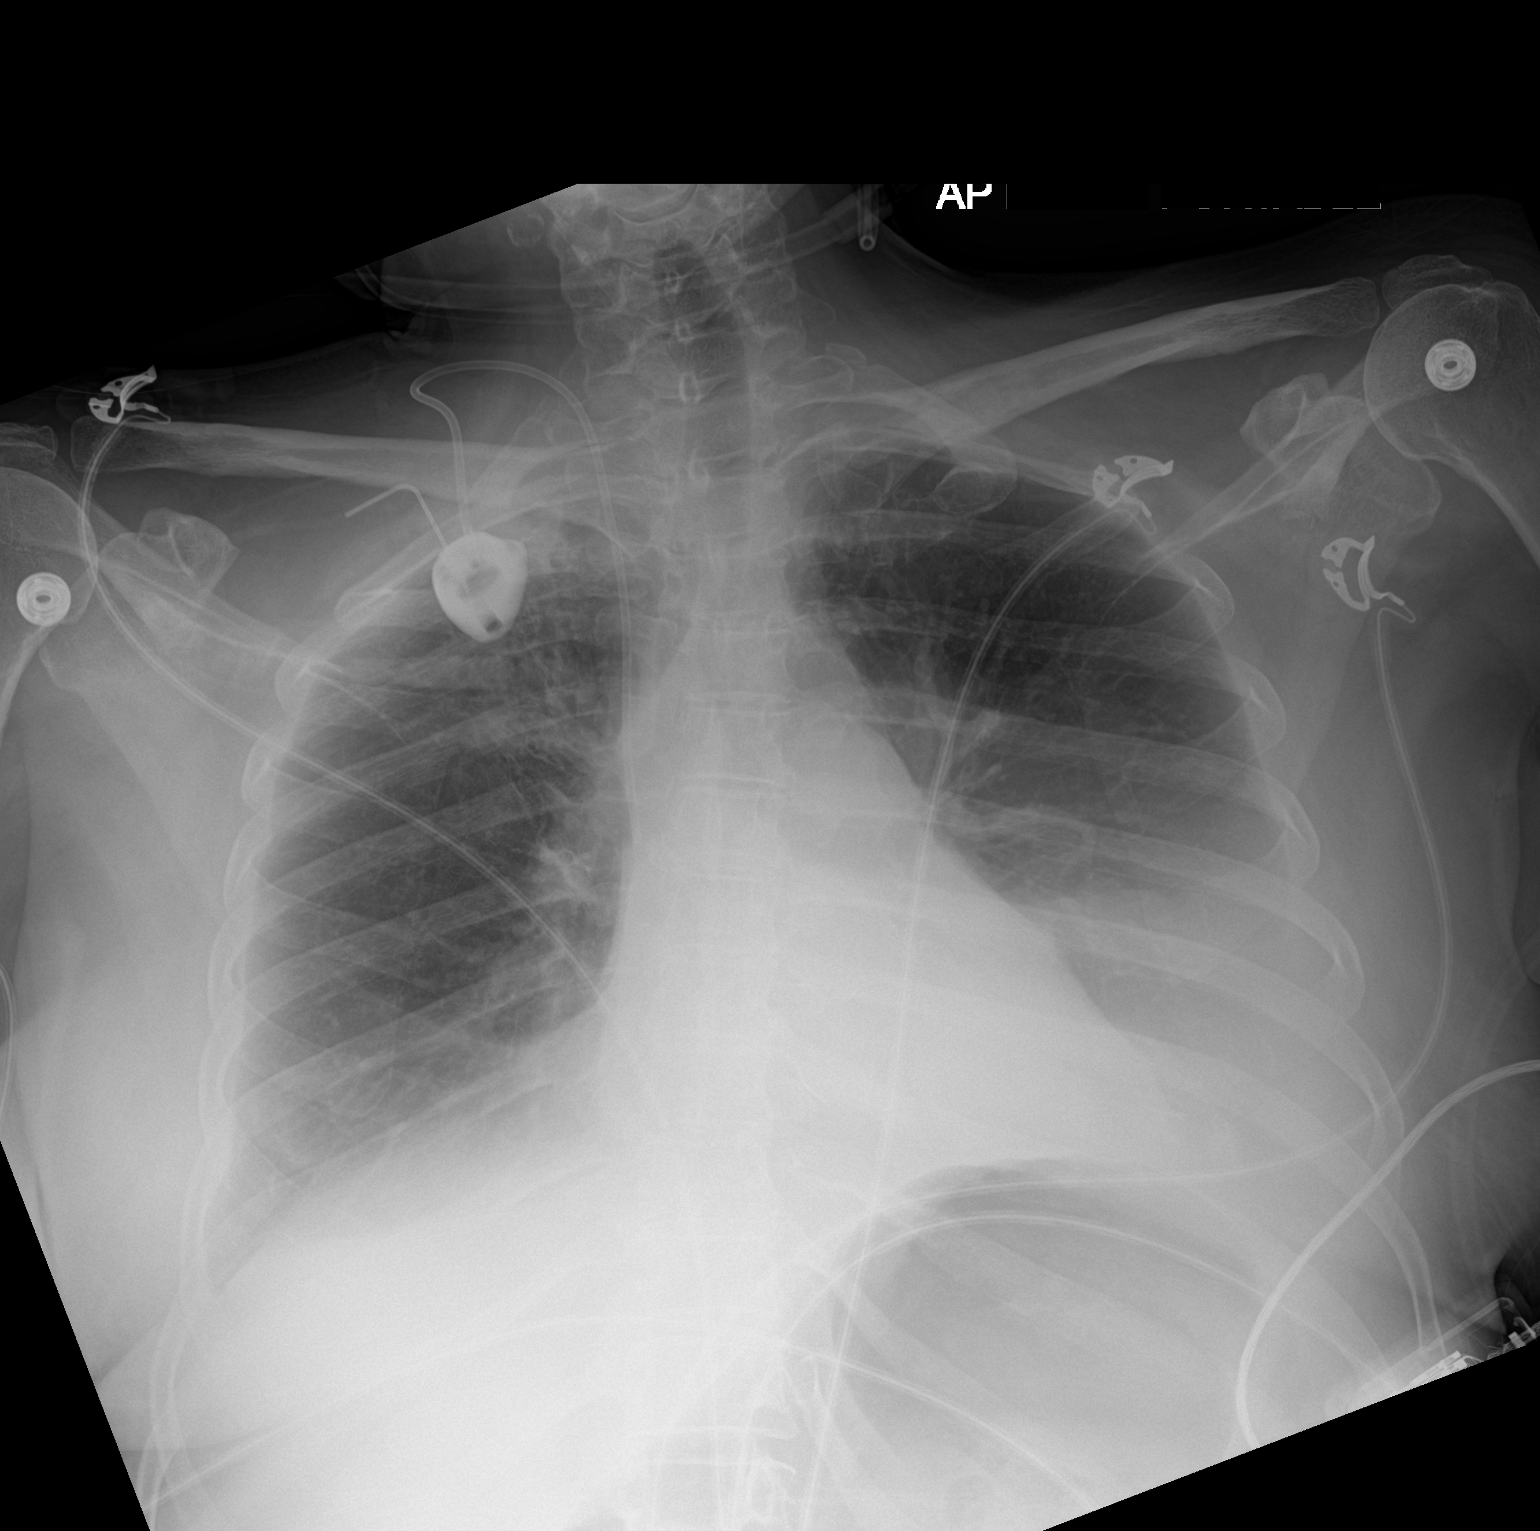

[1 of 1 positions shown; findings below may reference images not displayed]

FINDINGS: Interval decrease in the size of the right-sided pleural effusion.
Small residual right pleural effusion remains. There is a small left
pleural effusion, greater than right. There are bibasilar
atelectasis. Pneumonia is not excluded. Right upper lobe
interstitial coarsening and bronchitic changes. There is no
pneumothorax. Right-sided Port-A-Cath with tip in stable position.
Stable cardiac silhouette. No acute osseous pathology.
IMPRESSION: 1. Interval decrease in the size of the right pleural effusion. No
pneumothorax.
2. Small left pleural effusion and bibasilar atelectasis.

## 2021-08-23 IMAGING — US US THORACENTESIS ASP PLEURAL SPACE W/IMG GUIDE
1 series · 3 of 3 positions shown · non-contrast
Comparison: none

INDICATION: Patient with history of metastatic breast cancer, dyspnea, bilateral
pleural effusions right greater than left. Request received for
diagnostic and therapeutic right thoracentesis.

[Series 1: us thoracentesis asp pleural space w/img guide · 3 of 3 slices shown]
[im 1/3]
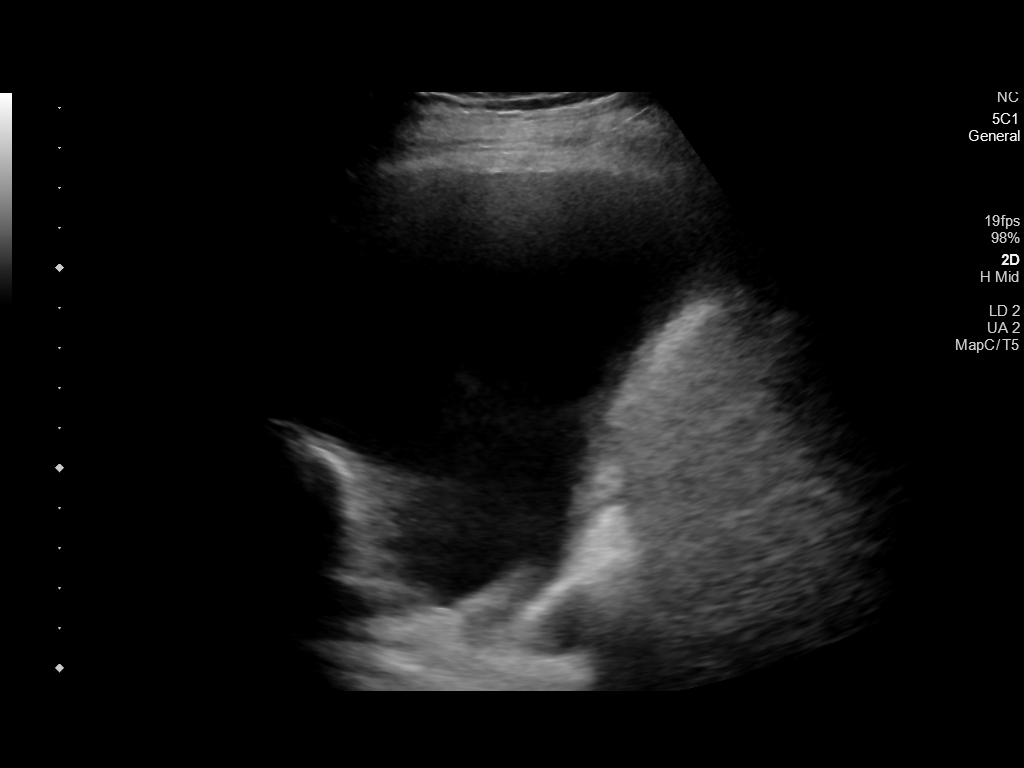
[im 2/3]
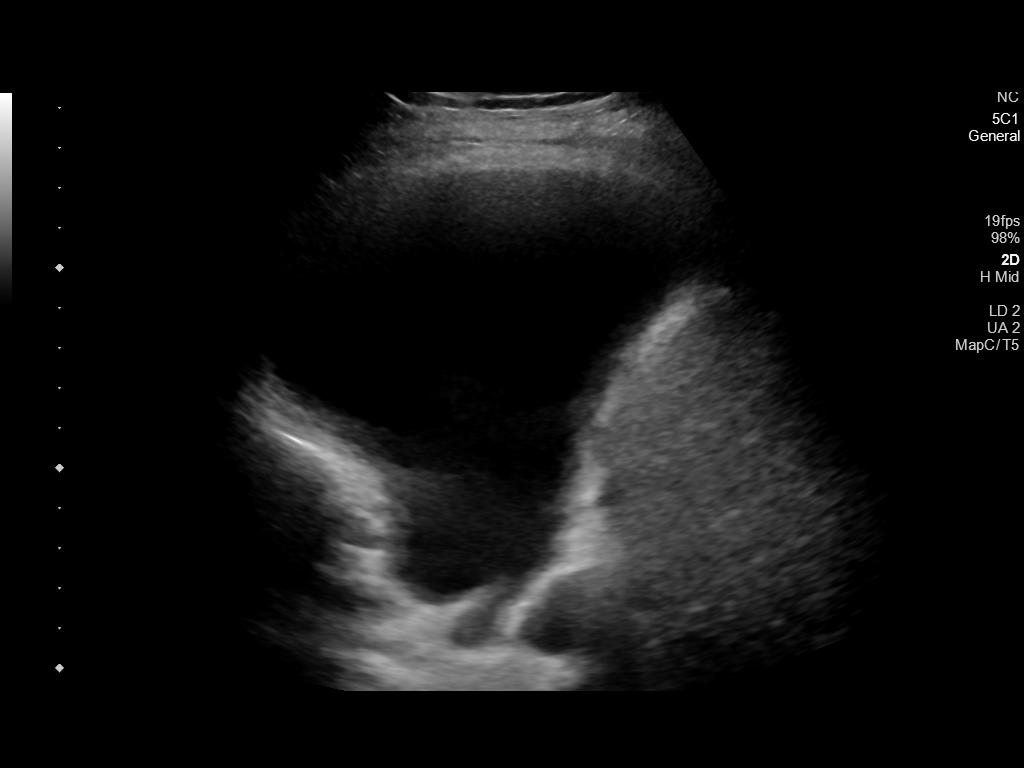
[im 3/3]
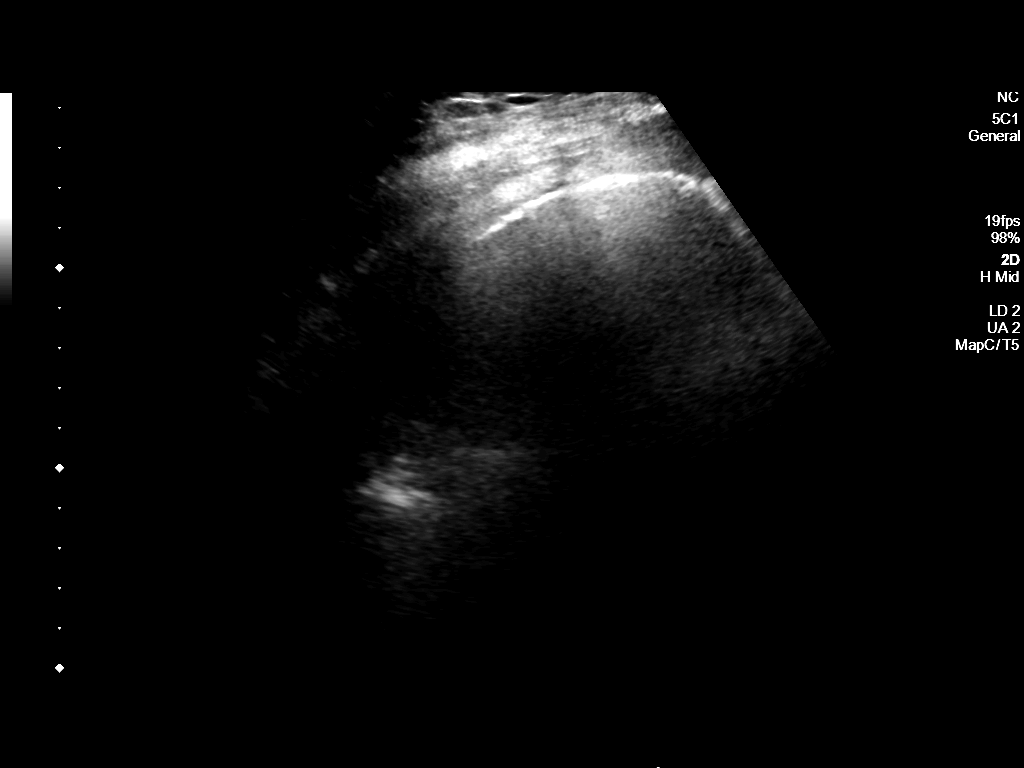

[3 of 3 positions shown; findings below may reference images not displayed]

EXAM:
ULTRASOUND GUIDED DIAGNOSTIC AND THERAPEUTIC RIGHT THORACENTESIS

MEDICATIONS:
None

COMPLICATIONS:
None immediate.

PROCEDURE:
An ultrasound guided thoracentesis was thoroughly discussed with the
patient and questions answered. The benefits, risks, alternatives
and complications were also discussed. The patient understands and
wishes to proceed with the procedure. Written consent was obtained.

Ultrasound was performed to localize and mark an adequate pocket of
fluid in the right chest. The area was then prepped and draped in
the normal sterile fashion. 1% Lidocaine was used for local
anesthesia. Under ultrasound guidance a 6 Fr Safe-T-Centesis
catheter was introduced. Thoracentesis was performed. The catheter
was removed and a dressing applied.
FINDINGS: A total of approximately 1.2 liters of hazy,amber fluid was removed.
Samples were sent to the laboratory as requested by the clinical
team.
IMPRESSION: Successful ultrasound guided diagnostic and therapeutic right
thoracentesis yielding 1.2 liters of pleural fluid.

No pneumothorax on follow-up radiograph.

## 2021-08-31 IMAGING — DX DG CHEST 1V PORT
1 series · 1 of 1 positions shown · non-contrast
Comparison: 07/03/2019

CLINICAL DATA: Fever

EXAM:
PORTABLE CHEST 1 VIEW

[chest ap]
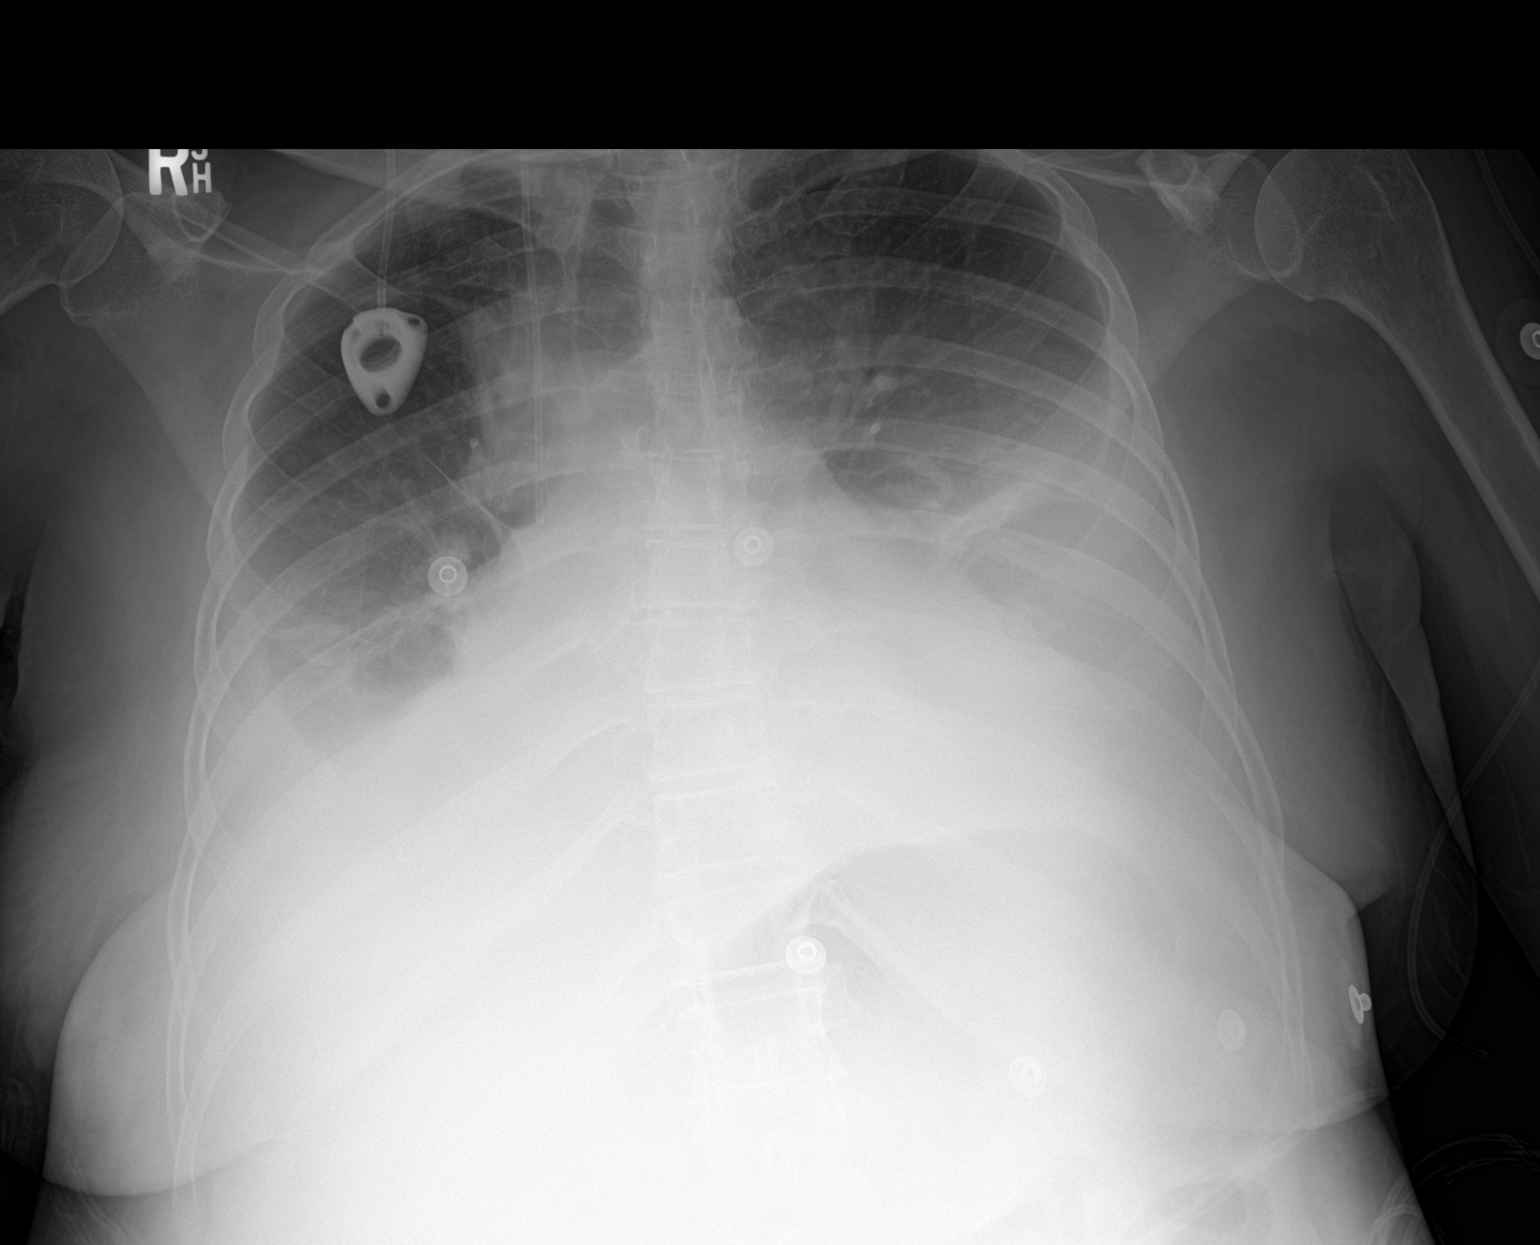

[1 of 1 positions shown; findings below may reference images not displayed]

FINDINGS: Right-sided chest port remains in place. Cardiac silhouette is
largely obscured. Masslike thickening of the lower right
paratracheal stripe, similar to finding on recent CT. Large
bilateral pleural effusions, left greater than right, which appear
increased in size compared to prior study. Bibasilar opacities favor
atelectasis. No pneumothorax.
IMPRESSION: 1. Large bilateral pleural effusions, left greater than right,
increased in size compared to prior.
2. Masslike thickening of the right paratracheal stripe is similar
in appearance to recent CT.
3. Bibasilar opacities favor atelectasis.
